# Patient Record
Sex: Female | Born: 1965
Health system: Southern US, Community
[De-identification: ages and names within clinical notes are randomized; demographics above are authoritative.]

## PROBLEM LIST (undated history)

## (undated) DIAGNOSIS — F419 Anxiety disorder, unspecified: Secondary | ICD-10-CM

## (undated) DIAGNOSIS — G4700 Insomnia, unspecified: Secondary | ICD-10-CM

## (undated) DIAGNOSIS — I6529 Occlusion and stenosis of unspecified carotid artery: Secondary | ICD-10-CM

## (undated) DIAGNOSIS — R51 Headache: Secondary | ICD-10-CM

## (undated) DIAGNOSIS — S15009A Unspecified injury of unspecified carotid artery, initial encounter: Secondary | ICD-10-CM

## (undated) DIAGNOSIS — K219 Gastro-esophageal reflux disease without esophagitis: Secondary | ICD-10-CM

## (undated) DIAGNOSIS — I10 Essential (primary) hypertension: Secondary | ICD-10-CM

## (undated) DIAGNOSIS — R002 Palpitations: Secondary | ICD-10-CM

## (undated) DIAGNOSIS — I671 Cerebral aneurysm, nonruptured: Secondary | ICD-10-CM

## (undated) DIAGNOSIS — I773 Arterial fibromuscular dysplasia: Secondary | ICD-10-CM

## (undated) DIAGNOSIS — I6509 Occlusion and stenosis of unspecified vertebral artery: Secondary | ICD-10-CM

## (undated) DIAGNOSIS — R519 Headache, unspecified: Secondary | ICD-10-CM

## (undated) DIAGNOSIS — K635 Polyp of colon: Secondary | ICD-10-CM

## (undated) DIAGNOSIS — M1990 Unspecified osteoarthritis, unspecified site: Secondary | ICD-10-CM

## (undated) DIAGNOSIS — Z8489 Family history of other specified conditions: Secondary | ICD-10-CM

## (undated) DIAGNOSIS — Z9889 Other specified postprocedural states: Secondary | ICD-10-CM

## (undated) HISTORY — DX: Gastro-esophageal reflux disease without esophagitis: K21.9

## (undated) HISTORY — DX: Other specified postprocedural states: Z98.890

## (undated) HISTORY — DX: Arterial fibromuscular dysplasia: I77.3

## (undated) HISTORY — DX: Occlusion and stenosis of unspecified vertebral artery: I65.09

## (undated) HISTORY — DX: Anxiety disorder, unspecified: F41.9

## (undated) HISTORY — PX: ESOPHAGOGASTRODUODENOSCOPY: SHX1529

## (undated) HISTORY — DX: Palpitations: R00.2

## (undated) HISTORY — DX: Occlusion and stenosis of unspecified carotid artery: I65.29

## (undated) HISTORY — DX: Essential (primary) hypertension: I10

## (undated) HISTORY — DX: Insomnia, unspecified: G47.00

## (undated) HISTORY — DX: Polyp of colon: K63.5

## (undated) HISTORY — DX: Unspecified osteoarthritis, unspecified site: M19.90

---

## 1989-10-19 HISTORY — PX: BREAST ENHANCEMENT SURGERY: SHX7

## 1999-03-20 ENCOUNTER — Other Ambulatory Visit: Admission: RE | Admit: 1999-03-20 | Discharge: 1999-03-20 | Payer: Self-pay | Admitting: Obstetrics and Gynecology

## 1999-05-12 ENCOUNTER — Other Ambulatory Visit: Admission: RE | Admit: 1999-05-12 | Discharge: 1999-05-12 | Payer: Self-pay | Admitting: Obstetrics and Gynecology

## 1999-05-12 ENCOUNTER — Encounter (INDEPENDENT_AMBULATORY_CARE_PROVIDER_SITE_OTHER): Payer: Self-pay

## 2000-12-16 ENCOUNTER — Encounter: Admission: RE | Admit: 2000-12-16 | Discharge: 2000-12-16 | Payer: Self-pay | Admitting: Endocrinology

## 2000-12-16 ENCOUNTER — Encounter: Payer: Self-pay | Admitting: Endocrinology

## 2001-01-10 ENCOUNTER — Other Ambulatory Visit: Admission: RE | Admit: 2001-01-10 | Discharge: 2001-01-10 | Payer: Self-pay | Admitting: Obstetrics and Gynecology

## 2002-02-01 ENCOUNTER — Other Ambulatory Visit: Admission: RE | Admit: 2002-02-01 | Discharge: 2002-02-01 | Payer: Self-pay | Admitting: Obstetrics and Gynecology

## 2003-02-07 ENCOUNTER — Other Ambulatory Visit: Admission: RE | Admit: 2003-02-07 | Discharge: 2003-02-07 | Payer: Self-pay | Admitting: Obstetrics and Gynecology

## 2003-05-02 ENCOUNTER — Encounter: Admission: RE | Admit: 2003-05-02 | Discharge: 2003-05-02 | Payer: Self-pay | Admitting: Obstetrics and Gynecology

## 2003-05-02 ENCOUNTER — Encounter: Payer: Self-pay | Admitting: Obstetrics and Gynecology

## 2004-02-08 ENCOUNTER — Other Ambulatory Visit: Admission: RE | Admit: 2004-02-08 | Discharge: 2004-02-08 | Payer: Self-pay | Admitting: Obstetrics and Gynecology

## 2004-08-15 ENCOUNTER — Encounter: Admission: RE | Admit: 2004-08-15 | Discharge: 2004-08-15 | Payer: Self-pay | Admitting: Cardiology

## 2004-08-25 ENCOUNTER — Ambulatory Visit: Payer: Self-pay | Admitting: Cardiology

## 2004-08-27 ENCOUNTER — Ambulatory Visit: Payer: Self-pay | Admitting: *Deleted

## 2004-08-27 ENCOUNTER — Ambulatory Visit: Payer: Self-pay

## 2004-09-09 ENCOUNTER — Ambulatory Visit: Payer: Self-pay | Admitting: Cardiology

## 2004-12-15 ENCOUNTER — Encounter: Admission: RE | Admit: 2004-12-15 | Discharge: 2004-12-15 | Payer: Self-pay | Admitting: Obstetrics and Gynecology

## 2005-03-12 ENCOUNTER — Other Ambulatory Visit: Admission: RE | Admit: 2005-03-12 | Discharge: 2005-03-12 | Payer: Self-pay | Admitting: Obstetrics and Gynecology

## 2005-10-19 DIAGNOSIS — Z9889 Other specified postprocedural states: Secondary | ICD-10-CM

## 2005-10-19 HISTORY — DX: Other specified postprocedural states: Z98.890

## 2006-02-26 ENCOUNTER — Encounter: Admission: RE | Admit: 2006-02-26 | Discharge: 2006-02-26 | Payer: Self-pay | Admitting: Obstetrics & Gynecology

## 2006-05-20 ENCOUNTER — Encounter: Admission: RE | Admit: 2006-05-20 | Discharge: 2006-05-20 | Payer: Self-pay | Admitting: Family Medicine

## 2007-03-23 ENCOUNTER — Encounter: Admission: RE | Admit: 2007-03-23 | Discharge: 2007-03-23 | Payer: Self-pay | Admitting: Obstetrics

## 2007-04-12 ENCOUNTER — Ambulatory Visit (HOSPITAL_COMMUNITY): Admission: RE | Admit: 2007-04-12 | Discharge: 2007-04-12 | Payer: Self-pay | Admitting: Obstetrics

## 2007-12-27 ENCOUNTER — Encounter: Admission: RE | Admit: 2007-12-27 | Discharge: 2007-12-27 | Payer: Self-pay | Admitting: Family Medicine

## 2008-01-02 ENCOUNTER — Ambulatory Visit: Payer: Self-pay | Admitting: Cardiology

## 2008-04-17 ENCOUNTER — Ambulatory Visit: Payer: Self-pay | Admitting: Family Medicine

## 2008-04-17 DIAGNOSIS — R51 Headache: Secondary | ICD-10-CM | POA: Insufficient documentation

## 2008-04-17 DIAGNOSIS — I10 Essential (primary) hypertension: Secondary | ICD-10-CM | POA: Insufficient documentation

## 2008-04-17 DIAGNOSIS — R519 Headache, unspecified: Secondary | ICD-10-CM | POA: Insufficient documentation

## 2008-04-17 DIAGNOSIS — F411 Generalized anxiety disorder: Secondary | ICD-10-CM

## 2008-04-17 DIAGNOSIS — G4700 Insomnia, unspecified: Secondary | ICD-10-CM | POA: Insufficient documentation

## 2008-04-17 DIAGNOSIS — K219 Gastro-esophageal reflux disease without esophagitis: Secondary | ICD-10-CM | POA: Insufficient documentation

## 2008-04-27 ENCOUNTER — Encounter: Admission: RE | Admit: 2008-04-27 | Discharge: 2008-04-27 | Payer: Self-pay | Admitting: Obstetrics

## 2008-06-22 ENCOUNTER — Ambulatory Visit: Payer: Self-pay | Admitting: Family Medicine

## 2008-07-11 ENCOUNTER — Telehealth: Payer: Self-pay | Admitting: Family Medicine

## 2008-09-27 ENCOUNTER — Encounter: Payer: Self-pay | Admitting: Family Medicine

## 2008-10-01 ENCOUNTER — Ambulatory Visit: Payer: Self-pay | Admitting: Cardiology

## 2008-10-10 ENCOUNTER — Ambulatory Visit: Payer: Self-pay | Admitting: Family Medicine

## 2008-10-29 ENCOUNTER — Ambulatory Visit: Payer: Self-pay | Admitting: Cardiology

## 2008-12-13 ENCOUNTER — Telehealth: Payer: Self-pay | Admitting: Family Medicine

## 2009-02-22 ENCOUNTER — Telehealth: Payer: Self-pay | Admitting: Family Medicine

## 2009-03-26 ENCOUNTER — Telehealth: Payer: Self-pay | Admitting: Cardiology

## 2009-03-27 DIAGNOSIS — R002 Palpitations: Secondary | ICD-10-CM | POA: Insufficient documentation

## 2009-04-04 ENCOUNTER — Telehealth: Payer: Self-pay | Admitting: Family Medicine

## 2009-04-30 ENCOUNTER — Encounter: Admission: RE | Admit: 2009-04-30 | Discharge: 2009-04-30 | Payer: Self-pay | Admitting: Obstetrics

## 2009-05-08 ENCOUNTER — Ambulatory Visit: Payer: Self-pay | Admitting: Family Medicine

## 2009-05-09 ENCOUNTER — Telehealth: Payer: Self-pay | Admitting: Family Medicine

## 2009-06-07 ENCOUNTER — Telehealth: Payer: Self-pay | Admitting: Family Medicine

## 2009-08-05 ENCOUNTER — Telehealth: Payer: Self-pay | Admitting: Cardiology

## 2009-08-05 ENCOUNTER — Emergency Department (HOSPITAL_COMMUNITY): Admission: EM | Admit: 2009-08-05 | Discharge: 2009-08-06 | Payer: Self-pay | Admitting: Emergency Medicine

## 2009-08-07 ENCOUNTER — Ambulatory Visit: Payer: Self-pay | Admitting: Family Medicine

## 2009-08-09 ENCOUNTER — Encounter: Payer: Self-pay | Admitting: Family Medicine

## 2009-10-15 ENCOUNTER — Telehealth: Payer: Self-pay | Admitting: Family Medicine

## 2009-10-31 ENCOUNTER — Telehealth: Payer: Self-pay | Admitting: Family Medicine

## 2009-11-20 ENCOUNTER — Ambulatory Visit: Payer: Self-pay | Admitting: Cardiology

## 2009-11-24 ENCOUNTER — Emergency Department (HOSPITAL_COMMUNITY): Admission: EM | Admit: 2009-11-24 | Discharge: 2009-11-24 | Payer: Self-pay | Admitting: Emergency Medicine

## 2009-12-27 ENCOUNTER — Ambulatory Visit: Payer: Self-pay | Admitting: Family Medicine

## 2009-12-27 DIAGNOSIS — M129 Arthropathy, unspecified: Secondary | ICD-10-CM | POA: Insufficient documentation

## 2009-12-27 DIAGNOSIS — G5760 Lesion of plantar nerve, unspecified lower limb: Secondary | ICD-10-CM | POA: Insufficient documentation

## 2009-12-30 LAB — CONVERTED CEMR LAB
ALT: 14 units/L (ref 0–35)
Albumin: 4 g/dL (ref 3.5–5.2)
Alkaline Phosphatase: 70 units/L (ref 39–117)
Calcium: 9.5 mg/dL (ref 8.4–10.5)
Eosinophils Absolute: 0 10*3/uL (ref 0.0–0.7)
GFR calc non Af Amer: 114.53 mL/min (ref >60)
HCT: 41.9 % (ref 36.0–46.0)
Hemoglobin: 13.7 g/dL (ref 12.0–15.0)
Lymphs Abs: 1.8 10*3/uL (ref 0.7–4.0)
MCHC: 32.7 g/dL (ref 30.0–36.0)
Neutrophils Relative %: 67.5 % (ref 43.0–77.0)
Platelets: 271 10*3/uL (ref 150.0–400.0)
RDW: 12.1 % (ref 11.5–14.6)
Rhuematoid fact SerPl-aCnc: 20.9 intl units/mL — ABNORMAL HIGH (ref 0.0–20.0)
Sed Rate: 18 mm/hr (ref 0–22)
TSH: 0.71 microintl units/mL (ref 0.35–5.50)
Total Protein: 7.5 g/dL (ref 6.0–8.3)
WBC: 7.8 10*3/uL (ref 4.5–10.5)

## 2010-01-01 LAB — CONVERTED CEMR LAB
ANA Titer 1: NEGATIVE
Anti Nuclear Antibody(ANA): POSITIVE — AB

## 2010-03-14 ENCOUNTER — Telehealth: Payer: Self-pay | Admitting: Family Medicine

## 2010-05-02 ENCOUNTER — Encounter: Admission: RE | Admit: 2010-05-02 | Discharge: 2010-05-02 | Payer: Self-pay | Admitting: Obstetrics

## 2010-05-16 ENCOUNTER — Telehealth (INDEPENDENT_AMBULATORY_CARE_PROVIDER_SITE_OTHER): Payer: Self-pay | Admitting: *Deleted

## 2010-05-16 ENCOUNTER — Ambulatory Visit: Payer: Self-pay | Admitting: Cardiology

## 2010-05-16 ENCOUNTER — Encounter (INDEPENDENT_AMBULATORY_CARE_PROVIDER_SITE_OTHER): Payer: Self-pay | Admitting: *Deleted

## 2010-05-16 ENCOUNTER — Encounter: Payer: Self-pay | Admitting: Cardiology

## 2010-05-16 ENCOUNTER — Observation Stay (HOSPITAL_COMMUNITY): Admission: EM | Admit: 2010-05-16 | Discharge: 2010-05-17 | Payer: Self-pay | Admitting: Emergency Medicine

## 2010-05-20 ENCOUNTER — Ambulatory Visit: Payer: Self-pay | Admitting: Family Medicine

## 2010-05-20 DIAGNOSIS — R079 Chest pain, unspecified: Secondary | ICD-10-CM | POA: Insufficient documentation

## 2010-05-21 ENCOUNTER — Telehealth: Payer: Self-pay | Admitting: Cardiology

## 2010-05-26 ENCOUNTER — Encounter: Payer: Self-pay | Admitting: Cardiology

## 2010-06-03 ENCOUNTER — Ambulatory Visit: Payer: Self-pay | Admitting: Cardiovascular Disease

## 2010-06-03 ENCOUNTER — Ambulatory Visit: Payer: Self-pay

## 2010-06-03 ENCOUNTER — Encounter: Payer: Self-pay | Admitting: Cardiology

## 2010-06-03 ENCOUNTER — Ambulatory Visit (HOSPITAL_COMMUNITY): Admission: RE | Admit: 2010-06-03 | Discharge: 2010-06-03 | Payer: Self-pay | Admitting: Obstetrics and Gynecology

## 2010-06-04 ENCOUNTER — Telehealth: Payer: Self-pay | Admitting: Cardiology

## 2010-06-09 ENCOUNTER — Ambulatory Visit (HOSPITAL_COMMUNITY): Admission: RE | Admit: 2010-06-09 | Discharge: 2010-06-09 | Payer: Self-pay | Admitting: Orthopaedic Surgery

## 2010-06-24 ENCOUNTER — Encounter: Payer: Self-pay | Admitting: Physician Assistant

## 2010-06-27 ENCOUNTER — Ambulatory Visit: Payer: Self-pay | Admitting: Cardiology

## 2010-06-27 ENCOUNTER — Encounter: Payer: Self-pay | Admitting: Physician Assistant

## 2010-10-03 ENCOUNTER — Ambulatory Visit (HOSPITAL_COMMUNITY)
Admission: RE | Admit: 2010-10-03 | Discharge: 2010-10-03 | Payer: Self-pay | Source: Home / Self Care | Attending: Obstetrics | Admitting: Obstetrics

## 2010-11-09 ENCOUNTER — Encounter: Payer: Self-pay | Admitting: Nephrology

## 2010-11-18 NOTE — Assessment & Plan Note (Signed)
Summary: eph per night message/post stress echo/lg   Visit Type:  Follow-up Primary Provider:  Nelwyn Salisbury MD   History of Present Illness: this is a 45 year old female patient who was recently admitted to the hospital with atypical chest pain. She ruled out for an MI and was scheduled for an outpatient stress echo. Stress echo was performed and showed normal LV function without wall motion abnormalities ejection fraction 55%. Stress portion was normal.  The patient also had trouble with hypertension in the hospital.she had been on metoprolol over the years but this was no longer controlling her blood pressure. She was switched to labetalol and this seems to be helping more. She works at [employer] and they have been checking her blood pressure daily. Systolic blood pressures been around 118 and diastolic in the 80s to 90s.  Patient denies any further chest pain, palpitations, dyspnea, dyspnea on exertion, dizziness, or presyncope.  Current Medications (verified): 1)  Maxalt-Mlt 10 Mg  Tbdp (Rizatriptan Benzoate) .... As Needed 2)  Lorazepam 1 Mg  Tabs (Lorazepam) .Marland Kitchen.. 1 Every 6 Hours As Needed Anxiety 3)  Nexium 40 Mg Cpdr (Esomeprazole Magnesium) .... Once Daily 4)  Zolpidem Tartrate 10 Mg  Tabs (Zolpidem Tartrate) .Marland Kitchen.. 1 By Mouth At Bedtime 5)  Vicodin 5-500 Mg Tabs (Hydrocodone-Acetaminophen) .Marland Kitchen.. 1 Q 6 Hours As Needed Pain 6)  Labetalol Hcl 200 Mg Tabs (Labetalol Hcl) .... 1/2 Tab Two Times A Day 7)  Promethazine Hcl 25 Mg Tabs (Promethazine Hcl) .Marland Kitchen.. 1 Q 4 Hours As Needed Nausea 8)  Aspirin 81 Mg Tbec (Aspirin) .... Once Daily  Allergies: 1)  ! Amoxicillin 2)  ! Sulfa  Past History:  Past Medical History: Last updated: 12/27/2009 Current Problems:  PALPITATIONS, HX OF (ICD-V12.50) HYPERTENSION (ICD-401.9) GERD (ICD-530.81) INSOMNIA (ICD-780.52) ANXIETY (ICD-300.00) HEADACHE (ICD-784.0)  Social History: Reviewed history from 03/27/2009 and no changes required.  The patient is divorced.  She has two children and now   two grandchildren.  She works at [employer].  She does walk   occasionally for exercise but has not done this recently.  She does not   smoke cigarettes.  She rarely drinks alcohol.      Review of Systems       see history of present illness  Vital Signs:  Patient profile:   45 year old female Height:      64.5 inches Weight:      150 pounds BMI:     25.44 Pulse rate:   77 / minute BP sitting:   122 / 82  (left arm)  Vitals Entered By: Laurance Flatten CMA (June 27, 2010 8:38 AM)  Physical Exam  General:   Well-nournished, in no acute distress. Neck: No JVD, HJR, Bruit, or thyroid enlargement Lungs: No tachypnea, clear without wheezing, rales, or rhonchi Cardiovascular: RRR, PMI not displaced, heart sounds normal, no murmurs, gallops, bruit, thrill, or heave. Abdomen: BS normal. Soft without organomegaly, masses, lesions or tenderness. Extremities: without cyanosis, clubbing or edema. Good distal pulses bilateral SKin: Warm, no lesions or rashes  Musculoskeletal: No deformities Neuro: no focal signs    EKG  Procedure date:  06/27/2010  Findings:      normal sinus rhythm ,normal EKG  Impression & Recommendations:  Problem # 1:  CHEST PAIN (ICD-786.50) Patient was admitted to the hospital with atypical chest pain. Stress echo was normal June 03, 2010 The following medications were removed from the medication list:    Metoprolol Tartrate 50 Mg  Tabs (Metoprolol tartrate) .Marland Kitchen... Take 1 tablet by mouth twice a day Her updated medication list for this problem includes:    Labetalol Hcl 200 Mg Tabs (Labetalol hcl) .Marland Kitchen... 1/2 tab two times a day    Aspirin 81 Mg Tbec (Aspirin) ..... Once daily  Problem # 2:  HYPERTENSION (ICD-401.9) Patient's blood pressure is better controlled on labetalol. I did discuss a low-sodium diet and we will give her 2 g sodium diet to follow up home. She says she eats alot of soups  and canned foods.She can follow up with Dr. Clent Ridges for her hypertension. The following medications were removed from the medication list:    Metoprolol Tartrate 50 Mg Tabs (Metoprolol tartrate) .Marland Kitchen... Take 1 tablet by mouth twice a day Her updated medication list for this problem includes:    Labetalol Hcl 200 Mg Tabs (Labetalol hcl) .Marland Kitchen... 1/2 tab two times a day    Aspirin 81 Mg Tbec (Aspirin) ..... Once daily  Problem # 3:  PALPITATIONS, HX OF (ICD-V12.50) Palpitations are resolved on labetalol  Patient Instructions: 1)  Your physician has requested that you limit the intake of sodium (salt) in your diet to two grams daily. Please see [facility] handout. 2)  Follow up as needed  Prevention & Chronic Care Immunizations   Influenza vaccine: Not documented    Tetanus booster: Not documented    Pneumococcal vaccine: Not documented  Other Screening   Pap smear: Not documented    Mammogram: Not documented   Smoking status: Not documented  Lipids   Total Cholesterol: Not documented   LDL: Not documented   LDL Direct: Not documented   HDL: Not documented   Triglycerides: Not documented  Hypertension   Last Blood Pressure: 122 / 82  (06/27/2010)   Serum creatinine: 0.6  (12/27/2009)   Serum potassium 4.3  (12/27/2009)  Self-Management Support :    Hypertension self-management support: Not documented

## 2010-11-18 NOTE — Miscellaneous (Signed)
  Clinical Lists Changes  Observations: Added new observation of MRA RENAL:  Findings:  There is flow in the celiac trunk, proximal SMA and   proximal IMA.  There are bilateral single renal arteries.  The   renal arteries are widely patent without evidence of stenosis.  The   visualized aorta is normal.    Incidentally, the patient has bilateral breast implants.  No gross   abnormality to the liver, adrenal glands, gallbladder, spleen or   pancreas.     IMPRESSION:   Normal MRA of the abdomen.  Negative for renal artery stenosis.  (06/09/2010 10:34) Added new observation of ECHOINTERP: - Stress ECG conclusions: There were no stress arrhythmias or   conduction abnormalities. The stress ECG was negative for   ischemia. - Staged echo: There was no echocardiographic evidence for   stress-induced ischemia. (06/03/2010 10:33)      Echocardiogram  Procedure date:  06/03/2010  Findings:      - Stress ECG conclusions: There were no stress arrhythmias or   conduction abnormalities. The stress ECG was negative for   ischemia. - Staged echo: There was no echocardiographic evidence for   stress-induced ischemia.  MRA Renal  Procedure date:  06/09/2010  Findings:       Findings:  There is flow in the celiac trunk, proximal SMA and   proximal IMA.  There are bilateral single renal arteries.  The   renal arteries are widely patent without evidence of stenosis.  The   visualized aorta is normal.    Incidentally, the patient has bilateral breast implants.  No gross   abnormality to the liver, adrenal glands, gallbladder, spleen or   pancreas.     IMPRESSION:   Normal MRA of the abdomen.  Negative for renal artery stenosis.

## 2010-11-18 NOTE — Progress Notes (Signed)
Summary: echo results  Phone Note Call from Patient   Caller: Patient [phone] Reason for Call: Talk to Nurse Summary of Call: wants echo results from yesterday-pls call [phone] Initial call taken by: Glynda Jaeger,  June 04, 2010 3:01 PM  Follow-up for Phone Call        I spoke with the pt and gave her the results of her stress echo.  The pt has a pending appt with the PA on 06/17/10.  I instructed the pt to keep this appt as scheduled.  I did make her aware that [facility] had not reviewed this test and our office would call her back with any other recommendations from Dr Antoine Poche.   Follow-up by: Julieta Gutting, RN, BSN,  June 04, 2010 3:16 PM

## 2010-11-18 NOTE — Progress Notes (Signed)
Summary: Pt req refill for Vicodin  Phone Note Call from Patient Call back at Home Phone [phone]   Caller: Patient Summary of Call: Pt called and is asking for a refill of Vicodin. Pls call in to [facility] on [address]  According to [facility]  last refills were on  3/11 3/24 4/3  4/14 4/29 5/12. Initial call taken by: Lucy Antigua,  Mar 14, 2010 11:44 AM  Follow-up for Phone Call        NO more refills. She is using too much of this. needs an OV to discuss this Follow-up by: Nelwyn Salisbury MD,  Mar 14, 2010 2:36 PM  Additional Follow-up for Phone Call Additional follow up Details #1::        LMOM needs OV for refills Additional Follow-up by: Raechel Ache, RN,  Mar 14, 2010 2:48 PM

## 2010-11-18 NOTE — Progress Notes (Signed)
Summary: Pt req to switch back to generic Ambien. The CR is too expensive  Phone Note Call from Patient Call back at Home Phone [phone]   Caller: Patient Summary of Call: Pt called and said that the Ambien CR is too expensive and would like to switch back to generic Ambien. Please call in generic Ambien to [facility] on [address]  Initial call taken by: Lucy Antigua,  October 31, 2009 10:10 AM  Follow-up for Phone Call        cancel the CR, and call in Zolpidem 10 mg at bedtime , #30 with 5 rf Follow-up by: Nelwyn Salisbury MD,  October 31, 2009 11:42 AM  Additional Follow-up for Phone Call Additional follow up Details #1::        Phone Call Completed, Rx Called In Additional Follow-up by: Alfred Levins, CMA,  October 31, 2009 11:44 AM    New/Updated Medications: ZOLPIDEM TARTRATE 10 MG  TABS (ZOLPIDEM TARTRATE) 1 by mouth at bedtime Prescriptions: ZOLPIDEM TARTRATE 10 MG  TABS (ZOLPIDEM TARTRATE) 1 by mouth at bedtime  #30 x 5   Entered by:   Alfred Levins, CMA   Authorized by:   Nelwyn Salisbury MD   Signed by:   Alfred Levins, CMA on 10/31/2009   Method used:   Telephoned to ...       [facility]  [address] [phone]* (retail)       [address] [place], [place]  [address]       Ph: [phone] or [phone]       Fax: [phone]   RxID:   [id number]

## 2010-11-18 NOTE — Letter (Signed)
Summary: ER Notification  [facility]ologist, Main Office  [address]   [place], [place] [address]   Phone: [phone]  Fax: [phone]    May 16, 2010 11:02 AM  Caroline Boone  The above referenced patient has been advised to report directly to the Emergency Room. Please see below for more information:  Dx: HYPERTENSIVE CRISIS     Private Vehicle  _____XX__________ or EMS:  ________________   Orders:  Yes ______ or No  _______   Notify upon arrival:     Trish [phone]       Or _________________   Thank you,    [facility] Staff

## 2010-11-18 NOTE — Assessment & Plan Note (Signed)
Summary: consult re: meds and ?gout/cjr   Vital Signs:  Patient profile:   45 year old female Weight:      151 pounds Temp:     98.5 degrees F oral Pulse rate:   92 / minute BP sitting:   122 / 98  (left arm) Cuff size:   regular  Vitals Entered By: Alfred Levins, CMA (December 27, 2009 8:39 AM) CC: migraine, gout lt foot   History of Present Illness: here for several issues. First for the past 2 months she has had frequent pain in the left forefoot. No trauma. It gets red at times but does not swell. She saw Urgent Care a week ago, and had a normal Xray and a test for uric acid that was normal. She was put on Meloxicam with slight improvement. Second, her maxalt often doesn't relieve her HAs, and she would like to have a rescue pain med to use. Third, she has some joint pains that come and go, and she has some rheumatoid arthritis in her family. She would like to be tested for this.   Current Medications (verified): 1)  Maxalt-Mlt 10 Mg  Tbdp (Rizatriptan Benzoate) .... As Needed 2)  Lorazepam 1 Mg  Tabs (Lorazepam) .Marland Kitchen.. 1 Every 6 Hours As Needed Anxiety 3)  Promethazine Hcl 25 Mg Supp (Promethazine Hcl) .Marland Kitchen.. 1 Every 4 Hours As Needed For Nausea 4)  Metoprolol Tartrate 50 Mg Tabs (Metoprolol Tartrate) .... Take 1 Tablet By Mouth Twice A Day 5)  Nexium 40 Mg Cpdr (Esomeprazole Magnesium) .... Take 1 Capsule By Mouth Once A Day 6)  Hydrochlorothiazide 25 Mg Tabs (Hydrochlorothiazide) .... Once Daily 7)  Zolpidem Tartrate 10 Mg  Tabs (Zolpidem Tartrate) .Marland Kitchen.. 1 By Mouth At Bedtime  Allergies (verified): 1)  ! Amoxicillin 2)  ! Sulfa  Past History:  Past Medical History: Current Problems:  PALPITATIONS, HX OF (ICD-V12.50) HYPERTENSION (ICD-401.9) GERD (ICD-530.81) INSOMNIA (ICD-780.52) ANXIETY (ICD-300.00) HEADACHE (ICD-784.0)  Past Surgical History: Reviewed history from 11/20/2009 and no changes required. Heart Catherization 2007 was normal Breast augmentation.   Review of  Systems  The patient denies anorexia, fever, weight loss, weight gain, vision loss, decreased hearing, hoarseness, chest pain, syncope, dyspnea on exertion, peripheral edema, prolonged cough, hemoptysis, abdominal pain, melena, hematochezia, severe indigestion/heartburn, hematuria, incontinence, genital sores, muscle weakness, suspicious skin lesions, transient blindness, difficulty walking, depression, unusual weight change, abnormal bleeding, enlarged lymph nodes, angioedema, breast masses, and testicular masses.    Physical Exam  General:  Well-developed,well-nourished,in no acute distress; alert,appropriate and cooperative throughout examination Msk:  No deformity or scoliosis noted of thoracic or lumbar spine.   Extremities:  No clubbing, cyanosis, edema, or deformity noted with normal full range of motion of all joints.  Tender in the left foot between the 1st and 2nd MTPs  Neurologic:  No cranial nerve deficits noted. Station and gait are normal. Plantar reflexes are down-going bilaterally. DTRs are symmetrical throughout. Sensory, motor and coordinative functions appear intact.   Impression & Recommendations:  Problem # 1:  MORTON'S NEUROMA (ICD-355.6)  Problem # 2:  ARTHRITIS (ICD-716.90)  Orders: Venipuncture (19147) TLB-BMP (Basic Metabolic Panel-BMET) (80048-METABOL) TLB-CBC Platelet - w/Differential (85025-CBCD) TLB-Hepatic/Liver Function Pnl (80076-HEPATIC) TLB-TSH (Thyroid Stimulating Hormone) (84443-TSH) TLB-Rheumatoid Factor (RA) (82956-OZ) TLB-Sedimentation Rate (ESR) (85652-ESR) T-Antinuclear Antib (ANA) (30865-78469)  Problem # 3:  HEADACHE (ICD-784.0)  Her updated medication list for this problem includes:    Maxalt-mlt 10 Mg Tbdp (Rizatriptan benzoate) .Marland Kitchen... As needed    Metoprolol Tartrate 50 Mg Tabs (  Metoprolol tartrate) .Marland Kitchen... Take 1 tablet by mouth twice a day    Vicodin 5-500 Mg Tabs (Hydrocodone-acetaminophen) .Marland Kitchen... 1 q 6 hours as needed pain  Complete  Medication List: 1)  Maxalt-mlt 10 Mg Tbdp (Rizatriptan benzoate) .... As needed 2)  Lorazepam 1 Mg Tabs (Lorazepam) .Marland Kitchen.. 1 every 6 hours as needed anxiety 3)  Promethazine Hcl 25 Mg Supp (Promethazine hcl) .Marland Kitchen.. 1 every 4 hours as needed for nausea 4)  Metoprolol Tartrate 50 Mg Tabs (Metoprolol tartrate) .... Take 1 tablet by mouth twice a day 5)  Nexium 40 Mg Cpdr (Esomeprazole magnesium) .... Take 1 capsule by mouth once a day 6)  Hydrochlorothiazide 25 Mg Tabs (Hydrochlorothiazide) .... Once daily 7)  Zolpidem Tartrate 10 Mg Tabs (Zolpidem tartrate) .Marland Kitchen.. 1 by mouth at bedtime 8)  Vicodin 5-500 Mg Tabs (Hydrocodone-acetaminophen) .Marland Kitchen.. 1 q 6 hours as needed pain  Patient Instructions: 1)  get labs. Add Vicodin to Maxalt as needed . Suggested she see the [facility] for her neuroma Prescriptions: VICODIN 5-500 MG TABS (HYDROCODONE-ACETAMINOPHEN) 1 q 6 hours as needed pain  #30 x 5   Entered and Authorized by:   Nelwyn Salisbury MD   Signed by:   Nelwyn Salisbury MD on 12/27/2009   Method used:   Print then Give to Patient   RxID:   [id number] MAXALT-MLT 10 MG  TBDP (RIZATRIPTAN BENZOATE) as needed  #12 x 11   Entered and Authorized by:   Nelwyn Salisbury MD   Signed by:   Nelwyn Salisbury MD on 12/27/2009   Method used:   Print then Give to Patient   RxID:   [id number]

## 2010-11-18 NOTE — Progress Notes (Signed)
Summary: lab work   Phone Note Call from Patient Call back at [employer] [phone]   Caller: Patient  Reason for Call: Talk to Nurse, Lab or Test Results Details for Reason: pt would like to have lab work draw at her office. fax # [phone]  Initial call taken by: Lorne Skeens,  May 21, 2010 2:30 PM  Follow-up for Phone Call        Spoke with pt. patient states she was d/c from hospital 05/17/10 On d/c she was adviced to come to the office for lab work on monday 05/26/10.She would like to have labs done at her work at [employer]. An order was faxed to [facility] for BMET lab work  per pt's request. Labs results to be fax back to office. Pt aware. Follow-up by: Ollen Gross, RN, BSN,  May 21, 2010 3:22 PM

## 2010-11-18 NOTE — Progress Notes (Signed)
  Phone Note From Other Clinic   Summary of Call: recieved DOD call that pt had a severe headache, palpitations and "looked bad". her bp is reported as 180/114. dr Jens Som discussed with caller and pt referred to [facility] for eval. cardmaster and ed notified. Initial call taken by: Deliah Goody, RN,  May 16, 2010 11:05 AM Caller: OFFICE

## 2010-11-18 NOTE — Assessment & Plan Note (Signed)
Summary: ROV/ GD   Visit Type:  Follow-up Primary Provider:  Nelwyn Salisbury MD  CC:  HTN.  History of Present Illness: The patient presents for followup of hypertension. Since I last saw her she has had a couple of episodes of elevated blood pressure. At one point she was in the emergency room with some chest discomfort and apparently her blood pressure was elevated. I reviewed these records but, as it is with ER notes, I had trouble finding data including the blood pressure. I do note that she was treated with a Z-Pak. A few days later she saw Dr. Clent Ridges and had hydrochlorothiazide added to her regimen. She says that at times she'll run with blood pressures in the 140-160 systolic range with diastolics above 100. At other times, as it is now, her blood pressure is very well controlled. She has no new cardiovascular symptoms. She is exercising at the gym. She denies chest discomfort, neck or arm discomfort. She denies shortness of breath, palpitations, presyncope or syncope.  Current Medications (verified): 1)  Maxalt-Mlt 10 Mg  Tbdp (Rizatriptan Benzoate) .... As Needed 2)  Lorazepam 1 Mg  Tabs (Lorazepam) .Marland Kitchen.. 1 Every 6 Hours As Needed Anxiety 3)  Promethazine Hcl 25 Mg Supp (Promethazine Hcl) .Marland Kitchen.. 1 Every 4 Hours As Needed For Nausea 4)  Metoprolol Tartrate 50 Mg Tabs (Metoprolol Tartrate) .... Take 1 Tablet By Mouth Twice A Day 5)  Nexium 40 Mg Cpdr (Esomeprazole Magnesium) .... Take 1 Capsule By Mouth Once A Day 6)  Hydrochlorothiazide 25 Mg Tabs (Hydrochlorothiazide) .... Once Daily 7)  Zolpidem Tartrate 10 Mg  Tabs (Zolpidem Tartrate) .Marland Kitchen.. 1 By Mouth At Bedtime  Allergies (verified): 1)  ! Amoxicillin 2)  ! Sulfa  Past History:  Past Medical History: Reviewed history from 03/27/2009 and no changes required. Current Problems:  PALPITATIONS, HX OF (ICD-V12.50) HYPERTENSION (ICD-401.9) GERD (ICD-530.81) GASTROENTERITIS (ICD-558.9) INSOMNIA (ICD-780.52) ACUTE SINUSITIS, UNSPECIFIED  (ICD-461.9) ANXIETY (ICD-300.00) FAMILY HISTORY DIABETES 1ST DEGREE RELATIVE (ICD-V18.0) FAMILY HISTORY BREAST CANCER 1ST DEGREE RELATIVE <50 (ICD-V16.3) HEADACHE (ICD-784.0)  Past Surgical History: Heart Catherization 2007 was normal Breast augmentation.   Review of Systems       As stated in the HPI and negative for all other systems.   Vital Signs:  Patient profile:   45 year old female Height:      64.5 inches Weight:      149 pounds BMI:     25.27 Pulse rate:   82 / minute Resp:     16 per minute BP sitting:   110 / 80  (right arm)  Vitals Entered By: Marrion Coy, CNA (November 20, 2009 10:17 AM)  Physical Exam  General:  Well developed, well nourished, in no acute distress. Head:  normocephalic and atraumatic Eyes:  PERRLA/EOM intact; conjunctiva and lids normal. Mouth:  Teeth, gums and palate normal. Oral mucosa normal. Neck:  Neck supple, no JVD. No masses, thyromegaly or abnormal cervical nodes. Chest Wall:  no deformities or breast masses noted Lungs:  Clear bilaterally to auscultation and percussion. Abdomen:  Bowel sounds positive; abdomen soft and non-tender without masses, organomegaly, or hernias noted. No hepatosplenomegaly. Msk:  Back normal, normal gait. Muscle strength and tone normal. Extremities:  No clubbing or cyanosis. Neurologic:  Alert and oriented x 3. Skin:  Intact without lesions or rashes. Cervical Nodes:  no significant adenopathy Axillary Nodes:  no significant adenopathy Inguinal Nodes:  no significant adenopathy Psych:  Normal affect.   Detailed Cardiovascular Exam  Neck    Carotids: Carotids full and equal bilaterally without bruits.      Neck Veins: Normal, no JVD.    Heart    Inspection: no deformities or lifts noted.      Palpation: normal PMI with no thrills palpable.      Auscultation: regular rate and rhythm, S1, S2 without murmurs, rubs, gallops, or clicks.    Vascular    Abdominal Aorta: no palpable masses,  pulsations, or audible bruits.      Femoral Pulses: normal femoral pulses bilaterally.      Pedal Pulses: normal pedal pulses bilaterally.      Radial Pulses: normal radial pulses bilaterally.      Peripheral Circulation: no clubbing, cyanosis, or edema noted with normal capillary refill.     EKG  Procedure date:  11/20/2009  Findings:      sinus rhythm, rate 80 to, axis within normal limits, nonspecific inferior and anterolateral T wave flattening, no significant change from previous  Impression & Recommendations:  Problem # 1:  HYPERTENSION (ICD-401.9) At this point we discussed treating her hypertension with an additional dose of beta blocker or half dose of beta blocker on the days when her blood pressure is elevated. She seems to have several days and around which would allow her to continue the higher dose and then wean herself down. She will let me know if she starts requiring this frequently as we might need to change meds. Otherwise no change in therapy is indicated.  Problem # 2:  PALPITATIONS, HX OF (ICD-V12.50) She he is having only rare symptoms. No further testing is suggested. Orders: EKG w/ Interpretation (93000)  Patient Instructions: 1)  Your physician recommends that you schedule a follow-up appointment in: 1 year with Dr Antoine Poche 2)  Your physician recommends that you continue on your current medications as directed. Please refer to the Current Medication list given to you today.

## 2010-11-18 NOTE — Assessment & Plan Note (Signed)
Summary: post hos fup/?sinus inf/njr   Vital Signs:  Patient profile:   45 year old female Weight:      152 pounds Temp:     98.5 degrees F oral BP sitting:   150 / 90  (left arm) Cuff size:   regular  Vitals Entered By: Kathrynn Speed CMA (May 20, 2010 10:10 AM) CC: post hospital fu, sinus infection, congestion, x 3 days,src   History of Present Illness: Here for follow up after a hospital stay from 05-16-10 to 05-17-10 for HAs and chest pains. She ruled out by enzymes, had a normal contrasted head CT and a normal ECHO. Lisinopril was added to her meds for HTN. She feels better now, but still gets a mild HA at times. She is set to have a stress test on 06-03-10, and she will see Dr. Antoine Poche after that.   Current Medications (verified): 1)  Maxalt-Mlt 10 Mg  Tbdp (Rizatriptan Benzoate) .... As Needed 2)  Lorazepam 1 Mg  Tabs (Lorazepam) .Marland Kitchen.. 1 Every 6 Hours As Needed Anxiety 3)  Promethazine Hcl 25 Mg Supp (Promethazine Hcl) .Marland Kitchen.. 1 Every 4 Hours As Needed For Nausea 4)  Metoprolol Tartrate 50 Mg Tabs (Metoprolol Tartrate) .... Take 1 Tablet By Mouth Twice A Day 5)  Nexium 40 Mg Cpdr (Esomeprazole Magnesium) .... Take 1 Capsule By Mouth Once A Day 6)  Hydrochlorothiazide 25 Mg Tabs (Hydrochlorothiazide) .... Once Daily 7)  Zolpidem Tartrate 10 Mg  Tabs (Zolpidem Tartrate) .Marland Kitchen.. 1 By Mouth At Bedtime 8)  Vicodin 5-500 Mg Tabs (Hydrocodone-Acetaminophen) .Marland Kitchen.. 1 Q 6 Hours As Needed Pain 9)  Lisinopril 10 Mg Tabs (Lisinopril) .... Once A Day  Allergies (verified): 1)  ! Amoxicillin 2)  ! Sulfa  Past History:  Past Medical History: Reviewed history from 12/27/2009 and no changes required. Current Problems:  PALPITATIONS, HX OF (ICD-V12.50) HYPERTENSION (ICD-401.9) GERD (ICD-530.81) INSOMNIA (ICD-780.52) ANXIETY (ICD-300.00) HEADACHE (ICD-784.0)  Past Surgical History: Reviewed history from 11/20/2009 and no changes required. Heart Catherization 2007 was normal Breast  augmentation.   Review of Systems  The patient denies anorexia, fever, weight loss, weight gain, vision loss, decreased hearing, hoarseness, syncope, dyspnea on exertion, peripheral edema, prolonged cough, hemoptysis, abdominal pain, melena, hematochezia, severe indigestion/heartburn, hematuria, incontinence, genital sores, muscle weakness, suspicious skin lesions, transient blindness, difficulty walking, depression, unusual weight change, abnormal bleeding, enlarged lymph nodes, angioedema, breast masses, and testicular masses.    Physical Exam  General:  Well-developed,well-nourished,in no acute distress; alert,appropriate and cooperative throughout examination Neck:  No deformities, masses, or tenderness noted. Chest Wall:  No deformities, masses, or tenderness noted. Lungs:  Normal respiratory effort, chest expands symmetrically. Lungs are clear to auscultation, no crackles or wheezes. Heart:  Normal rate and regular rhythm. S1 and S2 normal without gallop, murmur, click, rub or other extra sounds.   Impression & Recommendations:  Problem # 1:  HYPERTENSION (ICD-401.9)  The following medications were removed from the medication list:    Hydrochlorothiazide 25 Mg Tabs (Hydrochlorothiazide) ..... Once daily Her updated medication list for this problem includes:    Metoprolol Tartrate 50 Mg Tabs (Metoprolol tartrate) .Marland Kitchen... Take 1 tablet by mouth twice a day    Lisinopril 20 Mg Tabs (Lisinopril) ..... Once daily  Problem # 2:  HEADACHE (ICD-784.0)  Her updated medication list for this problem includes:    Maxalt-mlt 10 Mg Tbdp (Rizatriptan benzoate) .Marland Kitchen... As needed    Metoprolol Tartrate 50 Mg Tabs (Metoprolol tartrate) .Marland Kitchen... Take 1 tablet by mouth twice  a day    Vicodin 5-500 Mg Tabs (Hydrocodone-acetaminophen) .Marland Kitchen... 1 q 6 hours as needed pain    Aspirin 81 Mg Tbec (Aspirin) ..... Once daily  Problem # 3:  CHEST PAIN (ICD-786.50)  Complete Medication List: 1)  Maxalt-mlt 10 Mg  Tbdp (Rizatriptan benzoate) .... As needed 2)  Lorazepam 1 Mg Tabs (Lorazepam) .Marland Kitchen.. 1 every 6 hours as needed anxiety 3)  Metoprolol Tartrate 50 Mg Tabs (Metoprolol tartrate) .... Take 1 tablet by mouth twice a day 4)  Nexium 40 Mg Cpdr (Esomeprazole magnesium) .... Once daily 5)  Zolpidem Tartrate 10 Mg Tabs (Zolpidem tartrate) .Marland Kitchen.. 1 by mouth at bedtime 6)  Vicodin 5-500 Mg Tabs (Hydrocodone-acetaminophen) .Marland Kitchen.. 1 q 6 hours as needed pain 7)  Lisinopril 20 Mg Tabs (Lisinopril) .... Once daily 8)  Promethazine Hcl 25 Mg Tabs (Promethazine hcl) .Marland Kitchen.. 1 q 4 hours as needed nausea 9)  Aspirin 81 Mg Tbec (Aspirin) .... Once daily  Patient Instructions: 1)  We will increase Lisinopril to 20 mg a day. Add 81 mg ASA daily. Follow up with cardiology. I did clear her to return to work tomorrow.  Prescriptions: VICODIN 5-500 MG TABS (HYDROCODONE-ACETAMINOPHEN) 1 q 6 hours as needed pain  #60 x 5   Entered and Authorized by:   Nelwyn Salisbury MD   Signed by:   Nelwyn Salisbury MD on 05/20/2010   Method used:   Print then Give to Patient   RxID:   [id number] PROMETHAZINE HCL 25 MG TABS (PROMETHAZINE HCL) 1 q 4 hours as needed nausea  #30 x 5   Entered and Authorized by:   Nelwyn Salisbury MD   Signed by:   Nelwyn Salisbury MD on 05/20/2010   Method used:   Electronically to        [facility] [phone]* (retail)       [address] [place], [place]  [address]       Ph: [phone] or [phone]       Fax: [phone]   RxID:   [id number] LISINOPRIL 20 MG TABS (LISINOPRIL) once daily  #30 x 11   Entered and Authorized by:   Nelwyn Salisbury MD   Signed by:   Nelwyn Salisbury MD on 05/20/2010   Method used:   Electronically to        [facility] [phone]* (retail)       [address] [place], [place]  [address]       Ph: [phone] or [phone]       Fax: [phone]   RxID:   [id number]

## 2010-12-10 ENCOUNTER — Encounter: Payer: Self-pay | Admitting: Family Medicine

## 2010-12-16 ENCOUNTER — Encounter: Payer: Self-pay | Admitting: Family Medicine

## 2010-12-16 ENCOUNTER — Ambulatory Visit (INDEPENDENT_AMBULATORY_CARE_PROVIDER_SITE_OTHER): Payer: BC Managed Care – PPO | Admitting: Family Medicine

## 2010-12-16 DIAGNOSIS — R51 Headache: Secondary | ICD-10-CM

## 2010-12-16 DIAGNOSIS — F419 Anxiety disorder, unspecified: Secondary | ICD-10-CM

## 2010-12-16 DIAGNOSIS — F411 Generalized anxiety disorder: Secondary | ICD-10-CM

## 2010-12-16 DIAGNOSIS — K219 Gastro-esophageal reflux disease without esophagitis: Secondary | ICD-10-CM

## 2010-12-16 MED ORDER — RIZATRIPTAN BENZOATE 10 MG PO TABS: 10.0000 mg | ORAL_TABLET | ORAL | Status: DC | PRN

## 2010-12-16 MED ORDER — ESOMEPRAZOLE MAGNESIUM 40 MG PO CPDR: 40.0000 mg | DELAYED_RELEASE_CAPSULE | Freq: Every day | ORAL | Status: DC

## 2010-12-16 MED ORDER — LORAZEPAM 1 MG PO TABS: 1.0000 mg | ORAL_TABLET | Freq: Four times a day (QID) | ORAL | Status: DC | PRN

## 2010-12-16 MED ORDER — HYDROCODONE-ACETAMINOPHEN 5-500 MG PO TABS: 1.0000 | ORAL_TABLET | Freq: Four times a day (QID) | ORAL | Status: DC | PRN

## 2010-12-16 NOTE — Progress Notes (Signed)
  Subjective:    Patient ID: Caroline Boone, female    DOB: [birth year], 45 y.o.   MRN: [id number]  HPI Here for some med refills. She is out of Lorazepam, and she has been dealing with the recent death of her mother and other issues. Her GERD has out of control, but she admits to taking her Nexium only sporadically. She has had some intermittent LUQ pains lately, no nausea. Her BMs are regular.    Review of Systems  Constitutional: Negative.   Respiratory: Negative.   Cardiovascular: Negative.   Gastrointestinal: Positive for abdominal pain.  Psychiatric/Behavioral: Negative for sleep disturbance and dysphoric mood. The patient is nervous/anxious.        Objective:   Physical Exam  Constitutional: She appears well-developed and well-nourished.  Cardiovascular: Normal rate, regular rhythm and normal heart sounds.   Pulmonary/Chest: Effort normal and breath sounds normal.  Abdominal: Bowel sounds are normal. She exhibits no distension and no mass. There is no tenderness. There is no rebound and no guarding.  Psychiatric: She has a normal mood and affect. Her behavior is normal. Judgment and thought content normal.          Assessment & Plan:  The LUQ pain is consistent with some gastritis. Get back on Nexium every day.

## 2011-01-03 LAB — COMPREHENSIVE METABOLIC PANEL
AST: 19 U/L (ref 0–37)
CO2: 26 mEq/L (ref 19–32)
Calcium: 8.3 mg/dL — ABNORMAL LOW (ref 8.4–10.5)
Chloride: 108 mEq/L (ref 96–112)
Creatinine, Ser: 0.57 mg/dL (ref 0.4–1.2)
GFR calc non Af Amer: >60 mL/min (ref >60)
Glucose, Bld: 84 mg/dL (ref 70–99)
Total Bilirubin: 0.5 mg/dL (ref 0.3–1.2)

## 2011-01-03 LAB — POCT I-STAT, CHEM 8
Calcium, Ion: 1.15 mmol/L (ref 1.12–1.32)
Creatinine, Ser: 0.7 mg/dL (ref 0.4–1.2)
Glucose, Bld: 94 mg/dL (ref 70–99)
Hemoglobin: 15.3 g/dL — ABNORMAL HIGH (ref 12.0–15.0)
Sodium: 142 mEq/L (ref 135–145)
TCO2: 25 mmol/L (ref 0–100)

## 2011-01-03 LAB — DIFFERENTIAL
Eosinophils Absolute: 0.1 10*3/uL (ref 0.0–0.7)
Eosinophils Relative: 2 % (ref 0–5)
Lymphs Abs: 2.4 10*3/uL (ref 0.7–4.0)
Monocytes Relative: 5 % (ref 3–12)

## 2011-01-03 LAB — LIPID PANEL
Cholesterol: 176 mg/dL (ref 0–200)
HDL: 50 mg/dL (ref >39)
Total CHOL/HDL Ratio: 3.5 RATIO

## 2011-01-03 LAB — TROPONIN I: Troponin I: <0.01 ng/mL (ref 0.00–0.06)

## 2011-01-03 LAB — CK TOTAL AND CKMB (NOT AT ARMC): Relative Index: INVALID (ref 0.0–2.5)

## 2011-01-03 LAB — CBC
MCH: 33.1 pg (ref 26.0–34.0)
MCV: 95.5 fL (ref 78.0–100.0)
Platelets: 279 10*3/uL (ref 150–400)
RBC: 4.35 MIL/uL (ref 3.87–5.11)

## 2011-01-03 LAB — CARDIAC PANEL(CRET KIN+CKTOT+MB+TROPI)
CK, MB: 0.6 ng/mL (ref 0.3–4.0)
CK, MB: 0.8 ng/mL (ref 0.3–4.0)
Relative Index: INVALID (ref 0.0–2.5)
Relative Index: INVALID (ref 0.0–2.5)
Troponin I: 0.01 ng/mL (ref 0.00–0.06)
Troponin I: <0.01 ng/mL (ref 0.00–0.06)

## 2011-01-03 LAB — APTT: aPTT: 25 seconds (ref 24–37)

## 2011-01-03 LAB — PROTIME-INR: Prothrombin Time: 12.2 seconds (ref 11.6–15.2)

## 2011-01-07 LAB — COMPREHENSIVE METABOLIC PANEL
ALT: 14 U/L (ref 0–35)
AST: 16 U/L (ref 0–37)
Alkaline Phosphatase: 57 U/L (ref 39–117)
CO2: 29 mEq/L (ref 19–32)
Chloride: 110 mEq/L (ref 96–112)
GFR calc Af Amer: >60 mL/min (ref >60)
GFR calc non Af Amer: >60 mL/min (ref >60)
Glucose, Bld: 86 mg/dL (ref 70–99)
Sodium: 142 mEq/L (ref 135–145)
Total Bilirubin: 0.5 mg/dL (ref 0.3–1.2)

## 2011-01-07 LAB — DIFFERENTIAL
Basophils Absolute: 0 10*3/uL (ref 0.0–0.1)
Basophils Relative: 1 % (ref 0–1)
Eosinophils Absolute: 0.2 10*3/uL (ref 0.0–0.7)
Eosinophils Relative: 2 % (ref 0–5)
Neutrophils Relative %: 53 % (ref 43–77)

## 2011-01-07 LAB — LIPASE, BLOOD: Lipase: 33 U/L (ref 11–59)

## 2011-01-07 LAB — CBC
Hemoglobin: 13.9 g/dL (ref 12.0–15.0)
RBC: 4.16 MIL/uL (ref 3.87–5.11)
WBC: 8.3 10*3/uL (ref 4.0–10.5)

## 2011-01-07 LAB — POCT CARDIAC MARKERS: Troponin i, poc: <0.05 ng/mL (ref 0.00–0.09)

## 2011-01-22 LAB — POCT I-STAT, CHEM 8
BUN: 6 mg/dL (ref 6–23)
Calcium, Ion: 1.18 mmol/L (ref 1.12–1.32)
Chloride: 107 meq/L (ref 96–112)
Creatinine, Ser: 0.7 mg/dL (ref 0.4–1.2)
Glucose, Bld: 96 mg/dL (ref 70–99)
HCT: 46 % (ref 36.0–46.0)
Hemoglobin: 15.6 g/dL — ABNORMAL HIGH (ref 12.0–15.0)
Potassium: 3.5 meq/L (ref 3.5–5.1)
Sodium: 140 meq/L (ref 135–145)
TCO2: 24 mmol/L (ref 0–100)

## 2011-01-22 LAB — DIFFERENTIAL
Basophils Relative: 1 % (ref 0–1)
Eosinophils Relative: 2 % (ref 0–5)
Monocytes Absolute: 0.5 10*3/uL (ref 0.1–1.0)
Monocytes Relative: 6 % (ref 3–12)
Neutro Abs: 4.7 10*3/uL (ref 1.7–7.7)

## 2011-01-22 LAB — CBC
HCT: 43.8 % (ref 36.0–46.0)
Hemoglobin: 15 g/dL (ref 12.0–15.0)
MCHC: 34.3 g/dL (ref 30.0–36.0)
RBC: 4.52 MIL/uL (ref 3.87–5.11)

## 2011-01-22 LAB — POCT CARDIAC MARKERS
CKMB, poc: <1 ng/mL — ABNORMAL LOW (ref 1.0–8.0)
Myoglobin, poc: 47.6 ng/mL (ref 12–200)
Troponin i, poc: <0.05 ng/mL (ref 0.00–0.09)

## 2011-01-22 LAB — COMPREHENSIVE METABOLIC PANEL
ALT: 30 U/L (ref 0–35)
AST: 24 U/L (ref 0–37)
CO2: 24 mEq/L (ref 19–32)
Chloride: 104 mEq/L (ref 96–112)
Creatinine, Ser: 0.7 mg/dL (ref 0.4–1.2)
GFR calc Af Amer: >60 mL/min (ref >60)
GFR calc non Af Amer: >60 mL/min (ref >60)
Sodium: 136 mEq/L (ref 135–145)
Total Bilirubin: 0.8 mg/dL (ref 0.3–1.2)

## 2011-01-22 LAB — GLUCOSE, CAPILLARY: Glucose-Capillary: 92 mg/dL (ref 70–99)

## 2011-03-03 NOTE — Assessment & Plan Note (Signed)
[facility]                            CARDIOLOGY OFFICE NOTE   TANZA, PELLOT                       MRN:          [id number]  DATE:10/01/2008                            DOB:          1965/12/04    PRIMARY CARE PHYSICIAN:  Bryan Lemma. Ehinger, MD   REASON FOR PRESENTATION:  Evaluate the patient with hypertension and  palpitations.   HISTORY OF PRESENT ILLNESS:  The patient called today to add herself to  the schedule.  She is 45 year old.  She has a long history of  hypertension and palpitations.  We have been treating her with a higher  dose of Toprol since her last visit.  She said she is not getting the  palpitations that she was having.  She still occasionally feels her  heart beating in her head.  She noticed that this weekend in particular.  On Friday at work, she was noted to have a blood pressure of 140/110.  This morning, she had another blood pressure of 170/128 that was  repeated and was down to 140/110.  The initial was while seated, and the  second was while standing.  She has felt somewhat fatigued with this.  She has not had any chest pressure, neck or arm discomfort.  She has not  had any new shortness of breath, PND, or orthopnea.  She has had some  mild headaches that have not been typical migraine.  She has not had any  blurred vision, loss of speech, or motor.  She came today to be  evaluated for this.   PAST MEDICAL HISTORY:  1. Hypertension, borderline in the past.  2. Palpitations.  3. Migraines.  4. Breast augmentation.   ALLERGIES/INTOLERANCES:  None.   MEDICATIONS:  1. Metoprolol 25 mg b.i.d.  2. Nexium.   REVIEW OF SYSTEMS:  As stated in the HPI and otherwise negative for all  other systems.   PHYSICAL EXAMINATION:  GENERAL:  The patient is pleasant and in no  distress.  VITAL SIGNS:  Blood pressure 154/94, heart rate 78 and regular, weight  147 pounds.  HEENT:  Eyes unremarkable, pupils equal, round, and  reactive to light,  fundi not visualized, oral mucosa unremarkable.  NECK:  No jugular venous distention at 45 degrees, carotid upstroke  brisk and symmetric, left carotid bruit, no thyromegaly.  LYMPHATICS:  No cervical, axillary, or inguinal adenopathy.  LUNGS:  Clear to auscultation bilaterally.  BACK:  No costovertebral angle tenderness.  CHEST:  Unremarkable.  HEART:  PMI not displaced or sustained, S1 and S2 within normal limits,  no S3, no S4, no clicks, no rubs, no murmurs.  ABDOMEN:  Flat, positive bowel sounds, normal in frequency and pitch, no  bruits, no rebound, no guarding, no midline pulsatile mass, no  hepatomegaly, no splenomegaly.  SKIN:  No rashes, no nodules.  EXTREMITIES:  Pulses 2+ throughout, no edema, no cyanosis, no clubbing.  NEUROLOGIC:  Oriented to person, place, and time, cranial nerves II  through XII grossly intact, motor grossly intact.   EKG; sinus rhythm, rate 75, axis within normal limits, intervals  within  normal limits, no acute ST-T wave changes.   ASSESSMENT AND PLAN:  1. Hypertension.  Blood pressure is elevated.  Nothing has really      changed in her life other than her daughter and 69-month-old      grandchild are now living with her.  This could have been the      impetus to push her pressures over the age.  At this point, I am      going to start just by increasing metoprolol to 50 mg twice a day.      Hopefully, she would not feel increased fatigue with this.  She is      going to take this for a couple of weeks and let me know how her      blood pressures are doing.  The next step might be to add a low      dose of hydrochlorothiazide.  2. Palpitations.  She is feeling the strong heartbeat in her ears,      but not any irregular heartbeat.  I will manage this again with the      increased dose of beta-blocker.  3. Followup.  I will see her back in about a week since I am making      this change, but we will address any issues over the  phone as well.      Ultimately, she will follow with Dr. Manus Gunning for continued      management of this issue.     Rollene Rotunda, MD, [facility]  Electronically Signed    JH/MedQ  DD: 10/01/2008  DT: 10/02/2008  Job #: [id number]   cc:   Bryan Lemma. Manus Gunning, M.D.

## 2011-03-03 NOTE — Assessment & Plan Note (Signed)
[facility]                            CARDIOLOGY OFFICE NOTE   Caroline, Boone                       MRN:          [id number]  DATE:01/02/2008                            DOB:          [birth year]    PRIMARY CARE PHYSICIAN:  Bryan Lemma. Manus Gunning, M.D.   REASON FOR PRESENTATION:  Evaluate patient with hypertension and a  strong heartbeat.   HISTORY OF PRESENT ILLNESS:  The patient is a pleasant 45 year old whom  I did see in the past for palpitations.  There was a questionable  carotid bruit and she has had no significant plaque in her carotids in  the past.  She has had chest pain.  She had a stress perfusion study in  2005 demonstrating no evidence of ischemia or infarction and a well-  preserved ejection fraction of 66%.   Since I saw her, she has had no new palpitations.  She gets rare  irregular beats, but these are not particularly bothersome.  She was  bothered recently by a headache.  She had a hypertensive response with  this.  She was seen and treated by Dr. Manus Gunning.  She was at that time  describing a very strong heartbeat in her ears.  This was not  particularly fast or irregular.  It was just a pounding in her ears that  kept her awake at night.  This has been ongoing despite resolution of  the migraine.  She has also noted at home that her blood pressure has  been higher than usual; she reports in the 150s/90s.  She has been  having a little shortness of breath, feeling like she cannot take a  deep breath.  She is not describing dyspnea with exertion.  She is not  describing any PND or orthopnea.  She is not having any chest pressure,  neck or arm discomfort.   PAST MEDICAL HISTORY:  1. Borderline hypertension in the past.  2. Palpitations.  3. Migraines.   PAST SURGICAL HISTORY:  Breast augmentation.   ALLERGIES:  None.   MEDICATIONS:  1. Toprol 25 mg daily.  2. Nexium.   SOCIAL HISTORY:  The patient is divorced.  She has  two children and now  two grandchildren.  She works at [employer].  She does walk  occasionally for exercise but has not done this recently.  She does not  smoke cigarettes.  She rarely drinks alcohol.   FAMILY HISTORY:  Noncontributory for early coronary artery disease.   REVIEW OF SYSTEMS:  As stated in HPI, and otherwise negative for other  systems.   PHYSICAL EXAMINATION:  GENERAL:  The patient is in no acute distress.  VITAL SIGNS:  Blood pressure 131/78, heart rate 75 and regular, weight  150 pounds.  HEENT:  Eyelids unremarkable.  Pupils equal, round, reactive to light.  Fundi within normal limits.  Oral mucosa unremarkable.  NECK:  No jugular distention at 45 degrees.  Carotid upstroke brisk and  symmetric.  No bruits, no thyromegaly.  LYMPHATICS:  No cervical, axillary, inguinal adenopathy.  LUNGS:  Clear to auscultation bilaterally.  BACK:  No costovertebral angle tenderness.  CHEST:  Unremarkable.  HEART:  PMI not displaced or sustained.  S1-S2 within normal limits.  No  S3, no S4, no clicks, no rubs, no murmurs.  ABDOMEN:  Flat, positive bowel sounds.  Normal in frequency and pitch.  No bruits, rebound, guarding.  No midline pulsatile mass.  No  hepatomegaly, no splenomegaly.  SKIN:  No rashes, no nodules.  EXTREMITIES:  2+ pulses throughout.  No edema, no cyanosis, no clubbing.  NEUROLOGIC:  Oriented to person, place and time.  Cranial nerves II-XII  grossly intact, motor grossly intact.   ELECTROCARDIOGRAM:  Sinus rhythm, rate 70.  Axis within normal limits,  intervals within normal limits, no acute ST-T wave changes.   ASSESSMENT/PLAN:  1. Palpitations.  The patient is describing actually a strong      heartbeat in her ears.  Coincidentally, she has had elevated blood      pressures when she had been taking it at home.  This may well be      related.  At this point, I think treating her with an increased      dose of metoprolol 25 mg b.i.d. would be  reasonable.  If her blood      pressure remains high, it might be prudent to add a low dose of a      diuretic.  If then her symptoms did not resolve with this, I would      suggest follow up with an ENT.  I did look in her ears and did not      see any abnormalities in front of or behind the tympanic membranes.  2. Hypertension as above.  3. Dyspnea.  The patient is describing difficulty taking a deep      breath, but not really dyspnea on exertion, PND or orthopnea.  I      doubt that any current further cardiovascular testing would be      revealing.  4. Follow up would be based on the results of the above.     Rollene Rotunda, MD, [facility]  Electronically Signed    JH/MedQ  DD: 01/02/2008  DT: 01/02/2008  Job #: [id number]   cc:   Bryan Lemma. Manus Gunning, M.D.

## 2011-03-03 NOTE — Assessment & Plan Note (Signed)
[facility]                            CARDIOLOGY OFFICE NOTE   Caroline Boone, Caroline Boone                       MRN:          [id number]  DATE:10/29/2008                            DOB:          24-Dec-1965    PRIMARY CARE PHYSICIAN:  Tera Mater. Clent Ridges, MD.   REASON FOR PRESENTATION:  Evaluate the patient with hypertension.   HISTORY OF PRESENT ILLNESS:  The patient presents for followup of the  above.  At the last visit, I increased her metoprolol to 50 mg b.i.d.  from 25 mg twice a day.  She states this has helped.  It took several  days, but now she states that her blood pressure is much better  controlled.  She is not having palpitation she was having.  She was a  little fatigued.  This is not particularly problematic.  She has had no  presyncope or syncope.  She has had no chest pain or shortness of  breath.  She is still having some stress at home, but she is working on  relieving this as well.   PAST MEDICAL HISTORY:  1. Hypertension.  2. Palpitations.  3. Migraines.  4. Breast augmentation.   ALLERGIES:  None.   MEDICATIONS:  1. Nexium.  2. Metoprolol 50 mg b.i.d.   REVIEW OF SYSTEMS:  As stated in the HPI and otherwise negative for  other systems.   PHYSICAL EXAMINATION:  GENERAL:  The patient is in no distress.  VITAL SIGNS:  Blood pressure 114/71 and heart rate 71 and regular.  NECK:  No jugular venous distention at 45 degrees.  Carotid upstroke  brisk and symmetric.  No bruits.  No thyromegaly.  LYMPHATICS:  No cervical, axillary, or inguinal adenopathy.  LUNGS:  Clear to auscultation bilaterally.  BACK:  No costovertebral angle tenderness.  CHEST:  Unremarkable.  HEART:  PMI not displaced or sustained.  S1 and S2 within normal limits.  No S3, no S4.  No clicks, no rubs, no murmurs.  ABDOMEN:  Obese, positive bowel sounds, normal in frequency and pitch.  No bruits, no rebound, no guarding.  No midline pulsatile mass.  No  hepatomegaly.  No splenomegaly.  SKIN:  No rashes, no nodules.  EXTREMITIES:  Pulses 2+.  No edema.   ASSESSMENT AND PLAN:  1. Hypertension.  Blood pressure is well controlled on the current      regimen.  She is tolerating this.  No further therapy is warranted.  2. Palpitations.  These seem to be controlled as well.  She will      continue on the beta-blocker as listed.  3. Followup.  I will see her back in 6 months and then possibly as      needed thereafter.     Rollene Rotunda, MD, [facility]  Electronically Signed    JH/MedQ  DD: 10/29/2008  DT: 10/30/2008  Job #: [id number]   cc:   Jeannett Senior A. Clent Ridges, MD

## 2011-03-10 ENCOUNTER — Telehealth: Payer: Self-pay | Admitting: Cardiology

## 2011-03-10 NOTE — Telephone Encounter (Signed)
Please fax lab orders to [phone] att [facility] if needed before appt 6-1 so she can do at her office

## 2011-03-11 NOTE — Telephone Encounter (Signed)
Will forward to MD for lab orders if needed.

## 2011-03-11 NOTE — Telephone Encounter (Signed)
The patient should come back for a fasting lipid profile and CMET.

## 2011-03-13 NOTE — Telephone Encounter (Signed)
Order faxed as requested

## 2011-03-20 ENCOUNTER — Encounter: Payer: Self-pay | Admitting: Physician Assistant

## 2011-03-20 ENCOUNTER — Ambulatory Visit (INDEPENDENT_AMBULATORY_CARE_PROVIDER_SITE_OTHER): Payer: BC Managed Care – PPO | Admitting: Physician Assistant

## 2011-03-20 VITALS — BP 100/70 | HR 96 | Ht 64.0 in | Wt 155.0 lb

## 2011-03-20 DIAGNOSIS — R0602 Shortness of breath: Secondary | ICD-10-CM | POA: Insufficient documentation

## 2011-03-20 DIAGNOSIS — R5381 Other malaise: Secondary | ICD-10-CM

## 2011-03-20 DIAGNOSIS — E785 Hyperlipidemia, unspecified: Secondary | ICD-10-CM

## 2011-03-20 DIAGNOSIS — R0989 Other specified symptoms and signs involving the circulatory and respiratory systems: Secondary | ICD-10-CM

## 2011-03-20 DIAGNOSIS — R5383 Other fatigue: Secondary | ICD-10-CM | POA: Insufficient documentation

## 2011-03-20 DIAGNOSIS — R079 Chest pain, unspecified: Secondary | ICD-10-CM

## 2011-03-20 DIAGNOSIS — I10 Essential (primary) hypertension: Secondary | ICD-10-CM

## 2011-03-20 LAB — CBC WITH DIFFERENTIAL/PLATELET
Basophils Absolute: 0 10*3/uL (ref 0.0–0.1)
HCT: 39.7 % (ref 36.0–46.0)
Lymphs Abs: 2.5 10*3/uL (ref 0.7–4.0)
MCHC: 34.6 g/dL (ref 30.0–36.0)
MCV: 95.5 fl (ref 78.0–100.0)
Monocytes Absolute: 0.4 10*3/uL (ref 0.1–1.0)
Platelets: 313 10*3/uL (ref 150.0–400.0)
RDW: 12.7 % (ref 11.5–14.6)

## 2011-03-20 LAB — BRAIN NATRIURETIC PEPTIDE: Pro B Natriuretic peptide (BNP): 14 pg/mL (ref 0.0–100.0)

## 2011-03-20 LAB — TSH: TSH: 0.46 u[IU]/mL (ref 0.35–5.50)

## 2011-03-20 NOTE — Patient Instructions (Signed)
Your physician recommends that you schedule a follow-up appointment in: 05/01/11 DR. HOCHREIN @ 3:30 AS PER SCOTT WEAVER, PA-C  Your physician recommends that you return for lab work in: TODAY BNP, CBC W/DIFF, TSH 786.05, 780.79.  Your physician has requested that you have a BILATERAL carotid duplex 785.9 bruit. This test is an ultrasound of the carotid arteries in your neck. It looks at blood flow through these arteries that supply the brain with blood. Allow one hour for this exam. There are no restrictions or special instructions.  Your physician has requested that you have an echocardiogram 786.05, 780.79. Echocardiography is a painless test that uses sound waves to create images of your heart. It provides your doctor with information about the size and shape of your heart and how well your heart's chambers and valves are working. This procedure takes approximately one hour. There are no restrictions for this procedure.

## 2011-03-20 NOTE — Assessment & Plan Note (Addendum)
Atypical.  EKG unchanged.  HR up a little.  She had a neg GXT echo last year.  Proceed with echo and labs for now.  I recommended she talk to her GYN.  She probably should not take estrogen containing OCPs with her age over 35 and h/o HTN.

## 2011-03-20 NOTE — Assessment & Plan Note (Signed)
Controlled.  Continue current therapy.  

## 2011-03-20 NOTE — Assessment & Plan Note (Addendum)
No real signs of CHF on exam.  But, she had moderate LVH on echo last year.  She would be at risk for diastolic failure.  Repeat echo.  If LVF down, she will need a cath.  Check BNP.  If elevated, stop HCTZ and start Lasix.  Labs done 5/29: K 4.7, Creat 0.63.  She reports negative cath in 2007.  Will get records.  Negative stress echo last year.  Do not think she needs another stress test at this time.

## 2011-03-20 NOTE — Assessment & Plan Note (Signed)
As above.  Get CBC and TSH as well.

## 2011-03-20 NOTE — Progress Notes (Signed)
History of Present Illness: Primary Cardiologist:  Dr. Rollene Rotunda  Caroline Boone is a 45 y.o. female with a h/o HTN and palpitations followed by Dr. Antoine Poche presents for annual follow up.  She complains of fatigue and dyspnea with exertion over the last week.  She's been sleeping on 3 pillows.  She denies PND.  She denies pedal edema.  She describes NYHA class II symptoms.  She has some occasional chest discomfort on the left.  This is brief, less than seconds.  It is nonexertional.  She has had increased bloating and belching.  She denies dysphagia.  GERD takes Nexium.  She denies syncope.  There are no radiating symptoms.  She has been nauseated she takes Phenergan.  She had some elevated BPs last week.  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  She did this for a while, but is back to taking Lisinopril 10 mg with her HCTZ.  Past Medical History  Diagnosis Date  . Palpitations     hx  . Hypertension     a. echo 7/11: mod LVH, EF 55-65%  . GERD (gastroesophageal reflux disease)   . Insomnia   . Anxiety   . Headache   . History of cardiac cath 2007    normal  . Chest pain     GXT echo 8/11: normal LVF, no ischemia;  h/o neg. myoview in 2005    Current Outpatient Prescriptions  Medication Sig Dispense Refill  . aspirin 81 MG tablet Take 81 mg by mouth daily.        Marland Kitchen esomeprazole (NEXIUM) 40 MG capsule Take 1 capsule (40 mg total) by mouth daily before breakfast.  30 capsule  11  . hydrochlorothiazide 25 MG tablet Take 25 mg by mouth daily.        Marland Kitchen HYDROcodone-acetaminophen (VICODIN) 5-500 MG per tablet Take 1 tablet by mouth every 6 (six) hours as needed for pain.  60 tablet  5  . levonorgestrel-ethinyl estradiol (PORTIA-28) 0.15-30 MG-MCG per tablet Take 1 tablet by mouth daily.        Marland Kitchen lisinopril (PRINIVIL,ZESTRIL) 20 MG tablet Take 10 mg by mouth daily. Take extra 10 mg at bedtime if BP is up      . LORazepam  (ATIVAN) 1 MG tablet Take 1 tablet (1 mg total) by mouth every 6 (six) hours as needed for anxiety.  60 tablet  5  . promethazine (PHENERGAN) 25 MG tablet Take 25 mg by mouth 4 (four) times daily as needed.        . rizatriptan (MAXALT) 10 MG tablet Take 1 tablet (10 mg total) by mouth as needed for migraine. May repeat in 2 hours if needed  6 tablet  11  . zolpidem (AMBIEN) 10 MG tablet Take 10 mg by mouth at bedtime as needed.        Marland Kitchen DISCONTD: labetalol (NORMODYNE) 100 MG tablet Take 100 mg by mouth daily.          Allergies: Allergies  Allergen Reactions  . Amoxicillin   . Sulfonamide Derivatives    History  Substance Use Topics  . Smoking status: Never Smoker   . Smokeless tobacco: Not on file  . Alcohol Use: No    Family History  Problem Relation Age of Onset  . Arthritis      family hx  . Breast cancer      relative ,50  .  Diabetes      family hx  . Hypertension      family hx  . Kidney disease      family hx  . Lung cancer      family hx  . Coronary artery disease Mother   . Heart attack Father 25    CABG  . Kidney failure Father     ROS:  See history of present illness.  She has had a 10 pound weight gain in about a month.  She denies fevers, chills, cough, melena, hematochezia.  All other systems reviewed are negative.  Vital Signs: BP 100/70  Pulse 96  Ht 5\' 4"  (1.626 m)  Wt 155 lb (70.308 kg)  BMI 26.61 kg/m2  PHYSICAL EXAM: Well nourished, well developed, in no acute distress HEENT: normal Neck: no JVD Vascular: + left carotid bruit Cardiac:  normal S1, S2; RRR; no murmur Lungs:  clear to auscultation bilaterally, no wheezing, rhonchi or rales Abd: soft, nontender, no hepatomegaly Ext: no edema Skin: warm and dry Neuro:  CNs 2-12 intact, no focal abnormalities noted Psych: normal affect  EKG:  Sinus rhythm, HR 96, normal axis, PRWP, NSSTTW changes  ASSESSMENT AND PLAN:

## 2011-03-20 NOTE — Assessment & Plan Note (Signed)
Labs 5/29: LDL 115.  Recommend LDL < 100 with risk factors.  She can try diet and exercise first.

## 2011-03-20 NOTE — Assessment & Plan Note (Signed)
Check carotid dopplers 

## 2011-03-23 ENCOUNTER — Other Ambulatory Visit: Payer: Self-pay

## 2011-03-25 ENCOUNTER — Encounter: Payer: Self-pay | Admitting: *Deleted

## 2011-03-30 ENCOUNTER — Encounter: Payer: Self-pay | Admitting: Physician Assistant

## 2011-03-31 ENCOUNTER — Other Ambulatory Visit (HOSPITAL_COMMUNITY): Payer: BC Managed Care – PPO

## 2011-04-01 ENCOUNTER — Ambulatory Visit (HOSPITAL_COMMUNITY): Payer: BC Managed Care – PPO | Attending: Cardiovascular Disease | Admitting: Radiology

## 2011-04-01 ENCOUNTER — Encounter (INDEPENDENT_AMBULATORY_CARE_PROVIDER_SITE_OTHER): Payer: BC Managed Care – PPO | Admitting: *Deleted

## 2011-04-01 DIAGNOSIS — R0609 Other forms of dyspnea: Secondary | ICD-10-CM | POA: Insufficient documentation

## 2011-04-01 DIAGNOSIS — E785 Hyperlipidemia, unspecified: Secondary | ICD-10-CM | POA: Insufficient documentation

## 2011-04-01 DIAGNOSIS — R0989 Other specified symptoms and signs involving the circulatory and respiratory systems: Secondary | ICD-10-CM

## 2011-04-01 DIAGNOSIS — R5381 Other malaise: Secondary | ICD-10-CM | POA: Insufficient documentation

## 2011-04-01 DIAGNOSIS — R5383 Other fatigue: Secondary | ICD-10-CM

## 2011-04-01 DIAGNOSIS — I079 Rheumatic tricuspid valve disease, unspecified: Secondary | ICD-10-CM | POA: Insufficient documentation

## 2011-04-01 DIAGNOSIS — R072 Precordial pain: Secondary | ICD-10-CM | POA: Insufficient documentation

## 2011-04-01 DIAGNOSIS — I10 Essential (primary) hypertension: Secondary | ICD-10-CM | POA: Insufficient documentation

## 2011-04-02 ENCOUNTER — Encounter: Payer: Self-pay | Admitting: Physician Assistant

## 2011-04-03 ENCOUNTER — Encounter: Payer: Self-pay | Admitting: Physician Assistant

## 2011-04-07 ENCOUNTER — Encounter: Payer: Self-pay | Admitting: *Deleted

## 2011-04-07 ENCOUNTER — Other Ambulatory Visit: Payer: Self-pay | Admitting: *Deleted

## 2011-04-07 DIAGNOSIS — R0989 Other specified symptoms and signs involving the circulatory and respiratory systems: Secondary | ICD-10-CM

## 2011-04-08 ENCOUNTER — Other Ambulatory Visit: Payer: Self-pay | Admitting: Family Medicine

## 2011-04-08 ENCOUNTER — Ambulatory Visit (INDEPENDENT_AMBULATORY_CARE_PROVIDER_SITE_OTHER)
Admission: RE | Admit: 2011-04-08 | Discharge: 2011-04-08 | Disposition: A | Discharge status: Home / Self Care | Payer: BC Managed Care – PPO | Source: Ambulatory Visit | Attending: Physician Assistant | Admitting: Physician Assistant

## 2011-04-08 DIAGNOSIS — R0989 Other specified symptoms and signs involving the circulatory and respiratory systems: Secondary | ICD-10-CM

## 2011-04-08 MED ORDER — IOHEXOL 350 MG/ML SOLN
100.0000 mL | Freq: Once | INTRAVENOUS | Status: AC | PRN
  Administered 2011-04-08: 100 mL via INTRAVENOUS

## 2011-04-09 NOTE — Telephone Encounter (Signed)
Call  In #30 with 5 rf

## 2011-04-10 ENCOUNTER — Other Ambulatory Visit: Payer: Self-pay | Admitting: Physician Assistant

## 2011-04-10 ENCOUNTER — Telehealth: Payer: Self-pay | Admitting: *Deleted

## 2011-04-10 ENCOUNTER — Telehealth: Payer: Self-pay | Admitting: Physician Assistant

## 2011-04-10 DIAGNOSIS — I729 Aneurysm of unspecified site: Secondary | ICD-10-CM

## 2011-04-10 MED ORDER — ZOLPIDEM TARTRATE 10 MG PO TABS: 10.0000 mg | ORAL_TABLET | Freq: Every evening | ORAL | Status: DC | PRN

## 2011-04-10 NOTE — Telephone Encounter (Signed)
Caroline Newcomer, PA-C needs to s/w pt about an appt to see Dr. Sharin Grave @ [facility]. lmom @ home/mobile # and put not @ work today. Danielle Rankin

## 2011-04-10 NOTE — Telephone Encounter (Signed)
rtn call to carol

## 2011-04-13 ENCOUNTER — Other Ambulatory Visit (HOSPITAL_COMMUNITY): Payer: Self-pay | Admitting: Interventional Radiology

## 2011-04-13 ENCOUNTER — Ambulatory Visit (HOSPITAL_COMMUNITY)
Admission: RE | Admit: 2011-04-13 | Discharge: 2011-04-13 | Disposition: A | Discharge status: Home / Self Care | Payer: BC Managed Care – PPO | Source: Ambulatory Visit | Attending: Physician Assistant | Admitting: Physician Assistant

## 2011-04-13 DIAGNOSIS — I729 Aneurysm of unspecified site: Secondary | ICD-10-CM

## 2011-04-17 ENCOUNTER — Other Ambulatory Visit (HOSPITAL_COMMUNITY): Payer: BC Managed Care – PPO

## 2011-04-27 ENCOUNTER — Encounter (HOSPITAL_COMMUNITY)
Admission: RE | Admit: 2011-04-27 | Discharge: 2011-04-27 | Disposition: A | Discharge status: Home / Self Care | Payer: BC Managed Care – PPO | Source: Ambulatory Visit | Attending: Interventional Radiology | Admitting: Interventional Radiology

## 2011-04-27 ENCOUNTER — Other Ambulatory Visit (HOSPITAL_COMMUNITY): Payer: Self-pay | Admitting: Interventional Radiology

## 2011-04-27 DIAGNOSIS — I729 Aneurysm of unspecified site: Secondary | ICD-10-CM

## 2011-04-27 LAB — CBC
HCT: 40.5 % (ref 36.0–46.0)
MCH: 31.4 pg (ref 26.0–34.0)
MCV: 92.9 fL (ref 78.0–100.0)
RBC: 4.36 MIL/uL (ref 3.87–5.11)
WBC: 9.3 10*3/uL (ref 4.0–10.5)

## 2011-04-27 LAB — DIFFERENTIAL
Eosinophils Absolute: 0.2 10*3/uL (ref 0.0–0.7)
Eosinophils Relative: 2 % (ref 0–5)
Lymphocytes Relative: 45 % (ref 12–46)
Lymphs Abs: 4.2 10*3/uL — ABNORMAL HIGH (ref 0.7–4.0)
Monocytes Relative: 6 % (ref 3–12)

## 2011-04-27 LAB — BASIC METABOLIC PANEL
CO2: 27 mEq/L (ref 19–32)
Chloride: 102 mEq/L (ref 96–112)
Glucose, Bld: 93 mg/dL (ref 70–99)
Sodium: 138 mEq/L (ref 135–145)

## 2011-04-27 LAB — HCG, SERUM, QUALITATIVE: Preg, Serum: NEGATIVE

## 2011-04-27 LAB — SURGICAL PCR SCREEN
MRSA, PCR: NEGATIVE
Staphylococcus aureus: NEGATIVE

## 2011-04-27 LAB — APTT: aPTT: 27 seconds (ref 24–37)

## 2011-04-29 ENCOUNTER — Ambulatory Visit (HOSPITAL_COMMUNITY)
Admission: RE | Admit: 2011-04-29 | Discharge: 2011-04-29 | Disposition: A | Discharge status: Home / Self Care | Payer: BC Managed Care – PPO | Source: Ambulatory Visit | Attending: Interventional Radiology | Admitting: Interventional Radiology

## 2011-04-29 DIAGNOSIS — I671 Cerebral aneurysm, nonruptured: Secondary | ICD-10-CM | POA: Insufficient documentation

## 2011-04-29 DIAGNOSIS — J701 Chronic and other pulmonary manifestations due to radiation: Secondary | ICD-10-CM | POA: Insufficient documentation

## 2011-04-29 DIAGNOSIS — H9319 Tinnitus, unspecified ear: Secondary | ICD-10-CM | POA: Insufficient documentation

## 2011-04-29 DIAGNOSIS — Z01812 Encounter for preprocedural laboratory examination: Secondary | ICD-10-CM | POA: Insufficient documentation

## 2011-04-29 DIAGNOSIS — F411 Generalized anxiety disorder: Secondary | ICD-10-CM | POA: Insufficient documentation

## 2011-04-29 DIAGNOSIS — G43909 Migraine, unspecified, not intractable, without status migrainosus: Secondary | ICD-10-CM | POA: Insufficient documentation

## 2011-04-29 DIAGNOSIS — Z01818 Encounter for other preprocedural examination: Secondary | ICD-10-CM | POA: Insufficient documentation

## 2011-04-29 DIAGNOSIS — M542 Cervicalgia: Secondary | ICD-10-CM | POA: Insufficient documentation

## 2011-04-29 DIAGNOSIS — I10 Essential (primary) hypertension: Secondary | ICD-10-CM | POA: Insufficient documentation

## 2011-04-29 DIAGNOSIS — I729 Aneurysm of unspecified site: Secondary | ICD-10-CM

## 2011-04-29 LAB — PLATELET INHIBITION P2Y12
P2Y12 % Inhibition: 0 %
Platelet Function  P2Y12: 277 [PRU] (ref 194–418)
Platelet Function Baseline: 264 [PRU] (ref 194–418)

## 2011-04-29 MED ORDER — IOHEXOL 300 MG/ML  SOLN
150.0000 mL | Freq: Once | INTRAMUSCULAR | Status: AC | PRN
  Administered 2011-04-29: 85 mL via INTRAVENOUS

## 2011-05-01 ENCOUNTER — Other Ambulatory Visit (HOSPITAL_COMMUNITY): Payer: Self-pay | Admitting: Interventional Radiology

## 2011-05-01 ENCOUNTER — Encounter: Payer: Self-pay | Admitting: Cardiology

## 2011-05-01 ENCOUNTER — Ambulatory Visit (INDEPENDENT_AMBULATORY_CARE_PROVIDER_SITE_OTHER): Payer: BC Managed Care – PPO | Admitting: Cardiology

## 2011-05-01 DIAGNOSIS — I729 Aneurysm of unspecified site: Secondary | ICD-10-CM

## 2011-05-01 DIAGNOSIS — R079 Chest pain, unspecified: Secondary | ICD-10-CM

## 2011-05-01 DIAGNOSIS — R0989 Other specified symptoms and signs involving the circulatory and respiratory systems: Secondary | ICD-10-CM

## 2011-05-01 DIAGNOSIS — I10 Essential (primary) hypertension: Secondary | ICD-10-CM

## 2011-05-01 NOTE — Assessment & Plan Note (Signed)
Her BP is at target and I will continue the meds as listed.

## 2011-05-01 NOTE — Assessment & Plan Note (Signed)
She continues to have some atypical chest pain. However, there is no evidence for obstructive coronary disease and she had a negative stress test last year. I think further testing is indicated.

## 2011-05-01 NOTE — Patient Instructions (Signed)
Please continue medications as listed  Follow up with Dr Antoine Poche as needed

## 2011-05-01 NOTE — Progress Notes (Signed)
HPI The patient presents for followup after recent discovery of a bruit by Mr. 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 There was some fibromuscular dysplasia than the right as well.  Again, this was discovered incidentally when the patient presented for followup of some nonanginal chest pain. She is now being considered for a "Pipeline" stent repair of this finding.  She reports some chest discomfort that has been similar to previous pain. She did have a negative stress echo last year. I reviewed some hospital records.  She is having none of the headache that she had been. She is having no new shortness of breath PND or orthopnea. There is no report of palpitations, presyncope or syncope.  Allergies  Allergen Reactions  . Amoxicillin   . Sulfonamide Derivatives     Current Outpatient Prescriptions  Medication Sig Dispense Refill  . aspirin 81 MG tablet Take 81 mg by mouth daily.        . clopidogrel (PLAVIX) 75 MG tablet Take 75 mg by mouth daily.        Marland Kitchen esomeprazole (NEXIUM) 40 MG capsule Take 1 capsule (40 mg total) by mouth daily before breakfast.  30 capsule  11  . hydrochlorothiazide 25 MG tablet Take 25 mg by mouth daily.        Marland Kitchen HYDROcodone-acetaminophen (VICODIN) 5-500 MG per tablet Take 1 tablet by mouth every 6 (six) hours as needed for pain.  60 tablet  5  . lisinopril (PRINIVIL,ZESTRIL) 20 MG tablet Take 10 mg by mouth daily. Take extra 10 mg at bedtime if BP is up      . LORazepam (ATIVAN) 1 MG tablet Take 1 tablet (1 mg total) by mouth every 6 (six) hours as needed for anxiety.  60 tablet  5  . promethazine (PHENERGAN) 25 MG tablet Take 25 mg by mouth 4 (four) times daily as needed.        . rizatriptan (MAXALT) 10 MG tablet Take 1 tablet (10 mg total) by mouth as needed for migraine. May repeat in 2 hours if needed  6 tablet   11  . levonorgestrel-ethinyl estradiol (PORTIA-28) 0.15-30 MG-MCG per tablet Take 1 tablet by mouth daily.        Marland Kitchen zolpidem (AMBIEN) 10 MG tablet Take 1 tablet (10 mg total) by mouth at bedtime as needed.  30 tablet  5    Past Medical History  Diagnosis Date  . Palpitations     hx  . Hypertension     a. echo 7/11: mod LVH, EF 55-65%  . GERD (gastroesophageal reflux disease)   . Insomnia   . Anxiety   . Headache   . History of cardiac cath 2007    cath 11/25/05 by Dr. Jenne Campus with normal cors and EF 50%  . Chest pain     GXT echo 8/11: normal LVF, no ischemia;  h/o neg. myoview in 2005  . Carotid stenosis     Dopplers 6/12:0-39% bilateral; distal ICAs with marked tortuosity; CT angiogram recommended    Past Surgical History  Procedure Date  . Breast enhancement surgery     ROS:  As stated in the HPI and negative for all other systems.  PHYSICAL EXAM BP 137/86  Pulse 99  Ht 5\' 4"  (1.626 m)  Wt 155 lb (70.308 kg)  BMI 26.61 kg/m2  LMP 03/21/2011 GENERAL:  Well  appearing HEENT:  Pupils equal round and reactive, fundi not visualized, oral mucosa unremarkable NECK:  No jugular venous distention, waveform within normal limits, carotid upstroke brisk and symmetric, loud left carotid bruit, no thyromegaly LYMPHATICS:  No cervical, inguinal adenopathy LUNGS:  Clear to auscultation bilaterally BACK:  No CVA tenderness CHEST:  Unremarkable HEART:  PMI not displaced or sustained,S1 and S2 within normal limits, no S3, no S4, no clicks, no rubs, no murmurs ABD:  Flat, positive bowel sounds normal in frequency in pitch, no bruits, no rebound, no guarding, no midline pulsatile mass, no hepatomegaly, no splenomegaly EXT:  2 plus pulses throughout, no edema, no cyanosis no clubbing SKIN:  No rashes no nodules NEURO:  Cranial nerves II through XII grossly intact, motor grossly intact throughout PSYCH:  Cognitively intact, oriented to person place and time  ASSESSMENT AND PLAN

## 2011-05-01 NOTE — Assessment & Plan Note (Signed)
She now has a known pseudoaneurysm and is being evaluated by interventional radiology for closure of this. I will plan procedure at her request. She does have followup with interventional radiology to further discuss this procedure.

## 2011-05-04 ENCOUNTER — Telehealth: Payer: Self-pay | Admitting: Physician Assistant

## 2011-05-04 ENCOUNTER — Telehealth: Payer: Self-pay | Admitting: Cardiology

## 2011-05-04 NOTE — Telephone Encounter (Signed)
All Cardiac faxed to [facility] @ [phone]  05/04/11/km

## 2011-05-07 ENCOUNTER — Ambulatory Visit (HOSPITAL_COMMUNITY): Payer: BC Managed Care – PPO

## 2011-05-14 NOTE — Telephone Encounter (Signed)
Error

## 2011-05-22 ENCOUNTER — Emergency Department (HOSPITAL_COMMUNITY): Payer: BC Managed Care – PPO

## 2011-05-22 ENCOUNTER — Encounter (HOSPITAL_COMMUNITY): Payer: Self-pay | Admitting: Radiology

## 2011-05-22 ENCOUNTER — Emergency Department (HOSPITAL_COMMUNITY)
Admission: EM | Admit: 2011-05-22 | Discharge: 2011-05-22 | Disposition: A | Discharge status: Home / Self Care | Payer: BC Managed Care – PPO | Attending: Emergency Medicine | Admitting: Emergency Medicine

## 2011-05-22 DIAGNOSIS — I10 Essential (primary) hypertension: Secondary | ICD-10-CM | POA: Insufficient documentation

## 2011-05-22 DIAGNOSIS — H532 Diplopia: Secondary | ICD-10-CM | POA: Insufficient documentation

## 2011-05-22 DIAGNOSIS — H53149 Visual discomfort, unspecified: Secondary | ICD-10-CM | POA: Insufficient documentation

## 2011-05-22 DIAGNOSIS — I671 Cerebral aneurysm, nonruptured: Secondary | ICD-10-CM | POA: Insufficient documentation

## 2011-05-22 DIAGNOSIS — R51 Headache: Secondary | ICD-10-CM | POA: Insufficient documentation

## 2011-05-22 DIAGNOSIS — H538 Other visual disturbances: Secondary | ICD-10-CM | POA: Insufficient documentation

## 2011-05-22 MED ORDER — IOHEXOL 350 MG/ML SOLN
50.0000 mL | Freq: Once | INTRAVENOUS | Status: AC | PRN
  Administered 2011-05-22: 50 mL via INTRAVENOUS

## 2011-06-01 ENCOUNTER — Encounter: Payer: Self-pay | Admitting: Surgery

## 2011-06-01 ENCOUNTER — Ambulatory Visit (INDEPENDENT_AMBULATORY_CARE_PROVIDER_SITE_OTHER): Payer: BC Managed Care – PPO | Admitting: Surgery

## 2011-06-01 VITALS — BP 119/80 | HR 91

## 2011-06-01 DIAGNOSIS — I773 Arterial fibromuscular dysplasia: Secondary | ICD-10-CM

## 2011-06-01 DIAGNOSIS — I7789 Other specified disorders of arteries and arterioles: Secondary | ICD-10-CM

## 2011-06-01 DIAGNOSIS — I729 Aneurysm of unspecified site: Secondary | ICD-10-CM

## 2011-06-01 NOTE — Progress Notes (Signed)
Subjective:     Patient ID: Caroline Boone, female   DOB: [birth year], 45 y.o.   MRN: [id number]  HPI The patient is a 45 year old female that I had seen for evaluation of a carotid pseudoaneurysm. This was first detected in June. At the time of her irregular cardiology appointment a carotid bruit was auscultated. This led to a carotid ultrasound and ultimately a CT scan. Findings included left internal carotid artery with fibromuscular changes and a pseudoaneurysm originating at the level of C1. This was further evaluated with formal angiography by Dr. Earlene Plater for these findings reveal a large pseudoaneurysm within the left internal carotid artery at the C1 occipital region with a fusiform component extending into the cervical Petrus junction she also has fibromuscular changes within the internal carotid artery on both sides. As well as the left vertebral artery. Consideration was made for a pipeline stents repair the patient was referred to Sheldon Silvan at [facility]. After reviewing the angiogram the pipeline step procedure was delayed and the patient was placed on Plavix and aspirin. She is scheduled to have a followup in October. This past week the patient had another I. headache. She's been having headaches for several years now she describes them as being left-sided. They are associated with neck pain. A repeat CT scan was done which did not show any significant change.   Baseline medical problems are hypertension and headaches   Review of Systems  Constitutional: Negative for fever and chills.  HENT: Positive for neck stiffness.   Respiratory: Negative for shortness of breath.   Cardiovascular: Negative for chest pain and palpitations.  Neurological: Positive for headaches. Negative for syncope, weakness and numbness.  All other systems reviewed and are negative.       Past Medical History  Diagnosis Date  . Palpitations     hx  . Hypertension     a. echo 7/11: mod LVH, EF 55-65%   . GERD (gastroesophageal reflux disease)   . Insomnia   . Anxiety   . Headache   . History of cardiac cath 2007    cath 11/25/05 by Dr. Jenne Campus with normal cors and EF 50%  . Chest pain     GXT echo 8/11: normal LVF, no ischemia;  h/o neg. myoview in 2005  . Carotid stenosis     Dopplers 6/12:0-39% bilateral; distal ICAs with marked tortuosity; CT angiogram recommended    History  Substance Use Topics  . Smoking status: Never Smoker   . Smokeless tobacco: Not on file  . Alcohol Use: No    Family History  Problem Relation Age of Onset  . Arthritis      family hx  . Breast cancer      relative ,50  . Diabetes      family hx  . Hypertension      family hx  . Kidney disease      family hx  . Lung cancer      family hx  . Coronary artery disease Mother   . Kidney disease Mother   . Heart disease Mother   . Heart attack Father 62    CABG  . Kidney failure Father   . Hypertension Father   . Heart disease Father     Allergies  Allergen Reactions  . Amoxicillin   . Sulfonamide Derivatives     Current outpatient prescriptions:aspirin 81 MG tablet, Take 81 mg by mouth daily.  , Disp: , Rfl: ;  clopidogrel (PLAVIX) 75 MG  tablet, Take 75 mg by mouth daily.  , Disp: , Rfl: ;  esomeprazole (NEXIUM) 40 MG capsule, Take 1 capsule (40 mg total) by mouth daily before breakfast., Disp: 30 capsule, Rfl: 11;  hydrochlorothiazide 25 MG tablet, Take 25 mg by mouth daily.  , Disp: , Rfl:  HYDROcodone-acetaminophen (VICODIN) 5-500 MG per tablet, Take 1 tablet by mouth every 6 (six) hours as needed for pain., Disp: 60 tablet, Rfl: 5;  lisinopril (PRINIVIL,ZESTRIL) 20 MG tablet, Take 10 mg by mouth daily. Take extra 10 mg at bedtime if BP is up, Disp: , Rfl: ;  LORazepam (ATIVAN) 1 MG tablet, Take 1 tablet (1 mg total) by mouth every 6 (six) hours as needed for anxiety., Disp: 60 tablet, Rfl: 5 promethazine (PHENERGAN) 25 MG tablet, Take 25 mg by mouth 4 (four) times daily as needed.  , Disp:  , Rfl: ;  rizatriptan (MAXALT) 10 MG tablet, Take 1 tablet (10 mg total) by mouth as needed for migraine. May repeat in 2 hours if needed, Disp: 6 tablet, Rfl: 11;  zolpidem (AMBIEN) 10 MG tablet, Take 1 tablet (10 mg total) by mouth at bedtime as needed., Disp: 30 tablet, Rfl: 5 levonorgestrel-ethinyl estradiol (PORTIA-28) 0.15-30 MG-MCG per tablet, Take 1 tablet by mouth daily.  , Disp: , Rfl:   There were no vitals filed for this visit.  There is no height or weight on file to calculate BMI.       Objective:   Physical Exam  Constitutional: She is oriented to person, place, and time. She appears well-developed and well-nourished.  HENT:  Head: Atraumatic.  Cardiovascular: Normal rate.   Pulmonary/Chest: Effort normal.  Neurological: She is alert and oriented to person, place, and time. No cranial nerve deficit. Coordination normal.  Skin: Skin is warm and dry.       Assessment:    bilateral carotid fibromuscular changes. 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 She does have fibromuscular changes within the left internal carotid artery just distal to the bifurcation. There no significant stenoses associated with this area. As we get up into the area near C1 and the occipital junction there is a most likely pseudoaneurysm. I reiterated to the patient that this is above the area that I would feel comfortable managing her. She has seen a neurosurgeon at [facility] who has made a treatment plan with her. She will I. to get a second opinion. I recommend sending her down to the [facility]. I met him a copy of her scans to 4 down there.

## 2011-06-08 ENCOUNTER — Encounter: Payer: BC Managed Care – PPO | Admitting: Surgery

## 2011-06-19 ENCOUNTER — Encounter: Payer: Self-pay | Admitting: Family Medicine

## 2011-06-19 ENCOUNTER — Ambulatory Visit (INDEPENDENT_AMBULATORY_CARE_PROVIDER_SITE_OTHER): Payer: BC Managed Care – PPO | Admitting: Family Medicine

## 2011-06-19 VITALS — BP 108/78 | HR 108 | Temp 98.3°F | Wt 158.0 lb

## 2011-06-19 DIAGNOSIS — F411 Generalized anxiety disorder: Secondary | ICD-10-CM

## 2011-06-19 DIAGNOSIS — F419 Anxiety disorder, unspecified: Secondary | ICD-10-CM

## 2011-06-19 DIAGNOSIS — I720 Aneurysm of carotid artery: Secondary | ICD-10-CM

## 2011-06-19 MED ORDER — LORAZEPAM 1 MG PO TABS: 1.0000 mg | ORAL_TABLET | Freq: Four times a day (QID) | ORAL | Status: DC | PRN

## 2011-06-21 ENCOUNTER — Encounter: Payer: Self-pay | Admitting: Family Medicine

## 2011-06-21 NOTE — Progress Notes (Signed)
  Subjective:    Patient ID: Caroline Boone, female    DOB: [birth year], 45 y.o.   MRN: [id number]  HPI Here asking for advice about aproblem that has come up over the past month or so. She was found to have a bruit over the left carotid, and ended up having an angiogram which revealed fibromuscular dysplasia and a 17 x 18 mm pseudoaneurysm in the left internal carotid artery. She was evaluated by Dr. Coral Else here in [place] and was then referred to Dr. Willy Eddy at [facility] for this. There was thought about doing a bypass procedure for this but Dr. Holley Raring recommended that they wait for now and that she take Plavix and aspirin. She is having frequent left sided HAs and there is a question as to whether this pseudoaneurysm is contrubuting to the HAs. She is actually scheduled to see another neurosurgeon at [facility] next week for another opinion. No other neurologic deficits.    Review of Systems  Constitutional: Negative.   Eyes: Negative.   Neurological: Positive for headaches. Negative for dizziness, tremors, seizures, syncope, facial asymmetry, speech difficulty, weakness, light-headedness and numbness.       Objective:   Physical Exam  Constitutional: She is oriented to person, place, and time. She appears well-developed and well-nourished.  HENT:  Head: Normocephalic and atraumatic.  Right Ear: External ear normal.  Left Ear: External ear normal.  Nose: Nose normal.  Mouth/Throat: Oropharynx is clear and moist.  Eyes: Conjunctivae and EOM are normal. Pupils are equal, round, and reactive to light.  Neck: Neck supple. No thyromegaly present.       Positive left carotid bruit   Cardiovascular: Normal rate, regular rhythm, normal heart sounds and intact distal pulses.  Exam reveals no gallop and no friction rub.   No murmur heard. Lymphadenopathy:    She has no cervical adenopathy.  Neurological: She is alert and oriented to person, place, and time. No cranial nerve deficit.  She exhibits normal muscle tone. Coordination normal.          Assessment & Plan:  This poses an interesting dilemma of weighing the risks and benefits of a risky procedure. I told her to follow the experts' advice and to definitely get a second opinion before undergoing such a procedure. Of course this has been very anxiety provoking to her, and she asks for refills on Lorazepam. These were provided.  Follow up prn

## 2011-07-17 DIAGNOSIS — I773 Arterial fibromuscular dysplasia: Secondary | ICD-10-CM | POA: Insufficient documentation

## 2011-07-17 DIAGNOSIS — I729 Aneurysm of unspecified site: Secondary | ICD-10-CM | POA: Insufficient documentation

## 2011-07-24 ENCOUNTER — Other Ambulatory Visit: Payer: Self-pay | Admitting: Family Medicine

## 2011-07-24 NOTE — Telephone Encounter (Signed)
Script called in

## 2011-07-24 NOTE — Telephone Encounter (Signed)
Call in #60 with 5 rf 

## 2011-08-14 ENCOUNTER — Other Ambulatory Visit (HOSPITAL_COMMUNITY): Payer: Self-pay | Admitting: Nephrology

## 2011-08-14 DIAGNOSIS — I10 Essential (primary) hypertension: Secondary | ICD-10-CM

## 2011-08-19 ENCOUNTER — Other Ambulatory Visit (HOSPITAL_COMMUNITY): Payer: BC Managed Care – PPO

## 2011-08-20 ENCOUNTER — Ambulatory Visit (HOSPITAL_COMMUNITY)
Admission: RE | Admit: 2011-08-20 | Discharge: 2011-08-20 | Disposition: A | Discharge status: Home / Self Care | Payer: BC Managed Care – PPO | Source: Ambulatory Visit | Attending: Nephrology | Admitting: Nephrology

## 2011-08-20 DIAGNOSIS — I7789 Other specified disorders of arteries and arterioles: Secondary | ICD-10-CM | POA: Insufficient documentation

## 2011-08-20 DIAGNOSIS — M545 Low back pain, unspecified: Secondary | ICD-10-CM | POA: Insufficient documentation

## 2011-08-20 DIAGNOSIS — I10 Essential (primary) hypertension: Secondary | ICD-10-CM

## 2011-08-20 MED ORDER — GADOFOSVESET TRISODIUM 244 MG/ML IV SOLN
9.0000 mL | Freq: Once | INTRAVENOUS | Status: AC
  Administered 2011-08-20: 9 mL via INTRAVENOUS

## 2011-09-02 DIAGNOSIS — I701 Atherosclerosis of renal artery: Secondary | ICD-10-CM | POA: Insufficient documentation

## 2011-09-04 ENCOUNTER — Telehealth: Payer: Self-pay | Admitting: Family Medicine

## 2011-09-04 NOTE — Telephone Encounter (Signed)
Pt has elevated heart rate yesterday of 135 heart rate did drop. Today pt took heart rate and it was 104 and her blood pressure was 98/68. Pt wanted to know what she should do she went to [facility] to get a routine test and was advised to see her PCP

## 2011-09-04 NOTE — Telephone Encounter (Signed)
Dr. Claris Che nurse could not reach pt by phone.

## 2011-09-04 NOTE — Telephone Encounter (Signed)
Will discuss with Dr Clent Ridges.

## 2011-09-18 ENCOUNTER — Other Ambulatory Visit: Payer: Self-pay | Admitting: Family Medicine

## 2011-09-22 ENCOUNTER — Telehealth: Payer: Self-pay | Admitting: Family Medicine

## 2011-09-22 NOTE — Telephone Encounter (Signed)
Refill for Zolpidem Tartrate 10 mg take 1 po qhs prn and pt last here on 06/19/11.

## 2011-09-23 NOTE — Telephone Encounter (Signed)
Call in 6 month supply  

## 2011-09-24 MED ORDER — ZOLPIDEM TARTRATE 10 MG PO TABS: 10.0000 mg | ORAL_TABLET | Freq: Every evening | ORAL | Status: DC | PRN

## 2011-09-24 NOTE — Telephone Encounter (Signed)
Script called in

## 2011-09-29 ENCOUNTER — Ambulatory Visit (INDEPENDENT_AMBULATORY_CARE_PROVIDER_SITE_OTHER): Payer: BC Managed Care – PPO | Admitting: Family Medicine

## 2011-09-29 ENCOUNTER — Encounter: Payer: Self-pay | Admitting: Family Medicine

## 2011-09-29 ENCOUNTER — Telehealth: Payer: Self-pay

## 2011-09-29 VITALS — BP 100/80 | HR 120 | Temp 99.5°F | Wt 157.0 lb

## 2011-09-29 DIAGNOSIS — Z20828 Contact with and (suspected) exposure to other viral communicable diseases: Secondary | ICD-10-CM

## 2011-09-29 LAB — POCT INFLUENZA A/B
Influenza A, POC: NEGATIVE
Influenza B, POC: NEGATIVE

## 2011-09-29 MED ORDER — OSELTAMIVIR PHOSPHATE 75 MG PO CAPS: 75.0000 mg | ORAL_CAPSULE | ORAL | Status: AC

## 2011-09-29 MED ORDER — HYDROCODONE-HOMATROPINE 5-1.5 MG/5ML PO SYRP: 120.0000 mL | ORAL_SOLUTION | Freq: Four times a day (QID) | ORAL | Status: DC | PRN

## 2011-09-29 MED ORDER — HYDROCODONE-HOMATROPINE 5-1.5 MG/5ML PO SYRP: 5.0000 mL | ORAL_SOLUTION | Freq: Four times a day (QID) | ORAL | Status: AC | PRN

## 2011-09-29 NOTE — Patient Instructions (Signed)

## 2011-09-29 NOTE — Telephone Encounter (Signed)
Call in Tamiflu 75 mg bid for 5 days  

## 2011-09-29 NOTE — Telephone Encounter (Signed)
OK to give a note. Cough med called in.

## 2011-09-29 NOTE — Telephone Encounter (Signed)
Pt is aware waiting on MD °

## 2011-09-29 NOTE — Telephone Encounter (Signed)
Pt is also requesting Hydromet syrup for her cough and a doctor's note faxed.   Call [phone] for fax number.

## 2011-09-29 NOTE — Progress Notes (Signed)
  Subjective:    Patient ID: Caroline Boone, female    DOB: 11/11/1965, 45 y.o.   MRN: [id number]  HPI 45 year old white female nonsmoker in with complaints of sore throat sinus pressure and pain low-grade fever, and muscle aches that began last night. Patient is concerned because she's had exposure to influenza A. Her daughter tested positive for the flu. She has been taking over-the-counter Mucinex and Tylenol with little relief. Denies any chest pain, shortness of breath, headaches, or abdominal pain.  Review of Systems As stated above    Objective:   Physical Exam   Alert and oriented, no acute distress. HEENT: Ears are clear bilaterally, nasal turbinates are normal, pharynx moderately rate and no exudate, no sinus tenderness to palpation. No lymphadenopathy. Lungs: Clear to auscultation Cardiac: Regular rate and rhythm no murmurs rubs or gallops. Skin warm and dry. No cyanosis  Rapid influenza obtained: Negative     Assessment & Plan:  Assessment: Viral URI  Plan: Drink plenty of fluids. Rest. OTC symptomatic treatment. Call if symptoms worsen or persist. Tamiflu 75 mg one by mouth daily x10 #10 with no refills.

## 2011-09-29 NOTE — Telephone Encounter (Signed)
Ask Caroline Boone since she saw her today

## 2011-09-29 NOTE — Telephone Encounter (Signed)
Pt's daughter tested positive for flu at Urgent Care this past weekend. Pt is starting not to feel well and would like to have Tamiflu called in to [facility] on [address].  Pls advise.

## 2011-10-01 ENCOUNTER — Ambulatory Visit (INDEPENDENT_AMBULATORY_CARE_PROVIDER_SITE_OTHER): Payer: BC Managed Care – PPO | Admitting: Family Medicine

## 2011-10-01 ENCOUNTER — Encounter: Payer: Self-pay | Admitting: Family Medicine

## 2011-10-01 VITALS — BP 90/62 | Temp 97.7°F | Wt 156.0 lb

## 2011-10-01 DIAGNOSIS — J029 Acute pharyngitis, unspecified: Secondary | ICD-10-CM

## 2011-10-01 LAB — POCT RAPID STREP A (OFFICE): Rapid Strep A Screen: NEGATIVE

## 2011-10-01 MED ORDER — AZITHROMYCIN 250 MG PO TABS: ORAL_TABLET | ORAL | Status: AC

## 2011-10-01 NOTE — Progress Notes (Signed)
  Subjective:    Patient ID: Johnney Killian, female    DOB: [birth year], 45 y.o.   MRN: [id number]  HPI  Acute visit. Patient had acute onset couple days ago fever and sore throat. She's had some cough. Negative influenza screen 2 days ago but daughter had strep last week. Minimal sore throat. Using Hycodan cough syrup for cough suppression. Occasional headache. No stiff neck. Patient denies any nausea or vomiting. No diarrhea. No skin rash. She's had some sinus pressure maxillary sinus region.   Review of Systems As per history of present illness    Objective:   Physical Exam  Constitutional: She appears well-developed and well-nourished. No distress.  HENT:  Right Ear: External ear normal.  Left Ear: External ear normal.       Mild posterior pharynx erythema. No exudate  Neck: Neck supple.  Cardiovascular: Normal rate and regular rhythm.   Pulmonary/Chest: Effort normal and breath sounds normal. No respiratory distress. She has no wheezes. She has no rales.  Lymphadenopathy:    She has no cervical adenopathy.  Skin: No rash noted.          Assessment & Plan:  Probable viral syndrome. Rapid strep negative. Finish out Tamiflu. No antibiotics recommended at this time. Patient requests written prescription for antibiotic.   If symptoms worsen or second wave of illness start antibiotic.

## 2011-11-04 ENCOUNTER — Ambulatory Visit (INDEPENDENT_AMBULATORY_CARE_PROVIDER_SITE_OTHER): Payer: BC Managed Care – PPO | Admitting: Family Medicine

## 2011-11-04 ENCOUNTER — Encounter: Payer: Self-pay | Admitting: Family Medicine

## 2011-11-04 VITALS — BP 124/90 | HR 105 | Temp 98.1°F | Wt 158.0 lb

## 2011-11-04 DIAGNOSIS — F411 Generalized anxiety disorder: Secondary | ICD-10-CM

## 2011-11-04 DIAGNOSIS — M545 Low back pain, unspecified: Secondary | ICD-10-CM

## 2011-11-04 DIAGNOSIS — I7789 Other specified disorders of arteries and arterioles: Secondary | ICD-10-CM

## 2011-11-04 DIAGNOSIS — F419 Anxiety disorder, unspecified: Secondary | ICD-10-CM

## 2011-11-04 DIAGNOSIS — I773 Arterial fibromuscular dysplasia: Secondary | ICD-10-CM | POA: Insufficient documentation

## 2011-11-04 MED ORDER — ALPRAZOLAM 1 MG PO TABS: 1.0000 mg | ORAL_TABLET | Freq: Three times a day (TID) | ORAL | Status: AC | PRN

## 2011-11-04 MED ORDER — CYCLOBENZAPRINE HCL 10 MG PO TABS: 10.0000 mg | ORAL_TABLET | Freq: Three times a day (TID) | ORAL | Status: AC | PRN

## 2011-11-04 NOTE — Progress Notes (Signed)
  Subjective:    Patient ID: Caroline Boone, female    DOB: [birth year], 46 y.o.   MRN: [id number]  HPI Here for 2 reasons. First she tripped and fell while walking her dog 2 months ago, and she has had intermittent pain in the left lower back since then. The back gets stiff and she gets a dull pain in the area. No radiation down the leg. No numbness or weakness in the legs. Using heat and Vicodin with mixed results. Also her anxiety has been worse lately, and her Ativan is not helping very much. She asks if we can switch to Xanax.    Review of Systems  Constitutional: Negative.   Respiratory: Negative.   Cardiovascular: Negative.   Musculoskeletal: Positive for back pain.  Psychiatric/Behavioral: Negative for confusion, dysphoric mood, decreased concentration and agitation. The patient is nervous/anxious.        Objective:   Physical Exam  Constitutional:       Walks and gets on the exam table easily   Musculoskeletal:       Mildly tender in the left lower back with full ROM and negative SLRs   Psychiatric: She has a normal mood and affect. Her behavior is normal. Thought content normal.          Assessment & Plan:  Switch from Ativan to Xanax. She will continue heat and Vicodin, but we will add Flexeril for muscle spasms in the back. If she is not feeling better in a week or so, we may send her for PT

## 2011-11-12 ENCOUNTER — Ambulatory Visit (INDEPENDENT_AMBULATORY_CARE_PROVIDER_SITE_OTHER): Payer: BC Managed Care – PPO | Admitting: Physician Assistant

## 2011-11-12 ENCOUNTER — Encounter: Payer: Self-pay | Admitting: Physician Assistant

## 2011-11-12 DIAGNOSIS — I773 Arterial fibromuscular dysplasia: Secondary | ICD-10-CM

## 2011-11-12 DIAGNOSIS — R002 Palpitations: Secondary | ICD-10-CM

## 2011-11-12 DIAGNOSIS — I10 Essential (primary) hypertension: Secondary | ICD-10-CM

## 2011-11-12 DIAGNOSIS — I7789 Other specified disorders of arteries and arterioles: Secondary | ICD-10-CM

## 2011-11-12 LAB — BASIC METABOLIC PANEL
Chloride: 105 mEq/L (ref 96–112)
GFR: 128.28 mL/min (ref >60.00)
Potassium: 3.8 mEq/L (ref 3.5–5.1)
Sodium: 141 mEq/L (ref 135–145)

## 2011-11-12 LAB — CBC WITH DIFFERENTIAL/PLATELET
Basophils Relative: 0.7 % (ref 0.0–3.0)
Eosinophils Relative: 1.3 % (ref 0.0–5.0)
HCT: 40.9 % (ref 36.0–46.0)
Lymphs Abs: 2.1 10*3/uL (ref 0.7–4.0)
MCV: 96.1 fl (ref 78.0–100.0)
Monocytes Absolute: 0.5 10*3/uL (ref 0.1–1.0)
Monocytes Relative: 7.6 % (ref 3.0–12.0)
RBC: 4.25 Mil/uL (ref 3.87–5.11)
WBC: 6.9 10*3/uL (ref 4.5–10.5)

## 2011-11-12 MED ORDER — HYDROCHLOROTHIAZIDE 25 MG PO TABS: 25.0000 mg | ORAL_TABLET | Freq: Every day | ORAL | Status: DC

## 2011-11-12 MED ORDER — METOPROLOL SUCCINATE ER 25 MG PO TB24: 25.0000 mg | ORAL_TABLET | Freq: Every day | ORAL | Status: DC

## 2011-11-12 MED ORDER — NORTRIPTYLINE HCL 75 MG PO CAPS: 75.0000 mg | ORAL_CAPSULE | Freq: Every day | ORAL | Status: DC

## 2011-11-12 NOTE — Assessment & Plan Note (Signed)
Followed closely at [facility].  She remains on dual antiplatelet therapy.

## 2011-11-12 NOTE — Patient Instructions (Signed)
Your physician has recommended you make the following change in your medication: START TOPROL XL 25 MG 1 TABLET DAILY  Your physician recommends that you return for lab work in: TODAY BMET, CBC W/DIFF, TSH 785.1, 401.1  Your physician has recommended that you wear an event monitor DX 785.1 PALPITATIONS. Event monitors are medical devices that record the heart's electrical activity. Doctors most often [place] these monitors to diagnose arrhythmias. Arrhythmias are problems with the speed or rhythm of the heartbeat. The monitor is a small, portable device. You can wear one while you do your normal daily activities. This is usually used to diagnose what is causing palpitations/syncope (passing out).  Your physician recommends that you schedule a follow-up appointment in: 6 WEEKS WITH DR. HOCHREIN PER SCOTT WEAVER, PA-C

## 2011-11-12 NOTE — Assessment & Plan Note (Signed)
Add Toprol as noted.  She works at a [employer] and will monitor her blood pressures and notify us of any issues.

## 2011-11-12 NOTE — Progress Notes (Signed)
[address] [place], [place]  [address] Phone: [phone] Fax:  [phone]  Date:  11/12/2011   Name:  Caroline Boone       DOB:  04-12-1966 MRN:  [id number]  PCP:  Dr. Clent Ridges Primary Cardiologist:  Dr. Rollene Rotunda  Primary Electrophysiologist:  None    History of Present Illness: Caroline Boone is a 46 y.o. female who presents for rapid heart rate.  She has a h/o HTN and palpitations.  She has a history of normal cardiac catheterization in, and GERD, anxiety.  Stress echo 8/11: Normal LV function, no ischemia.  Prior Myoview in 2005 also negative.  Echocardiogram 6/12: EF 55-60%, PASP 29.  The patient had a carotid bruit on exam when last seen by me in 6/12.  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  She also had an MRA in 10/12 demonstrated fibromuscular dysplasia of the renal arteries.  She was apparently referred to interventional radiology.  She was last seen by Dr. Antoine Poche in 7/12.  In followup, she was being followed at [facility].  It was felt that surgery for her carotid pseudoaneurysm was too risky.  He was placed on dual antiplatelet therapy.  She eventually transferred her care to Dr. Dannielle Huh at [facility].  She gets serial scans.  She has been placed on TCA's to prevent headaches.  Amitriptyline was recently changed to nortriptyline.  She fell earlier this month.  A couple of days later, she had some confusion and was admitted to [facility].  There was some concern that she was having a stroke but apparently her workup was negative.  During her time in the hospital, her heart rate was noted to be elevated.  She reports her heart rates were between 114-125.  Her blood pressure was also elevated.  She has been told by her neurologist and neurosurgeon at she should try to keep her blood pressure low and her heart rate down.  She has several activities from which she is  restricted.  She denies chest pain, dyspnea, syncope, orthopnea, PND or edema.  She does have rapid palpitations.  She denies any significant symptoms with this.  Past Medical History  Diagnosis Date  . Palpitations     hx  . Hypertension     a. echo 7/11: mod LVH, EF 55-65%  . GERD (gastroesophageal reflux disease)   . Insomnia   . Anxiety   . Headache   . History of cardiac cath 2007    cath 11/25/05 by Dr. Jenne Campus with normal cors and EF 50%  . Chest pain     GXT echo 8/11: normal LVF, no ischemia;  h/o neg. myoview in 2005  . Carotid stenosis     Dopplers 6/12:0-39% bilateral; distal ICAs with marked tortuosity; CT angiogram recommended  . 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 Prescott Parma  at [facility]  . Fibromuscular dysplasia     affects left vertebral,  artery, left ICA, left renal artery    Current Outpatient Prescriptions  Medication Sig Dispense Refill  . ALPRAZolam (XANAX) 1 MG tablet Take 1 tablet (1 mg total) by mouth 3 (three) times daily as needed for anxiety.  90 tablet  2  . aspirin 81 MG tablet Take 81 mg by mouth daily.        . clopidogrel (PLAVIX) 75 MG  tablet Take 75 mg by mouth daily.        . cyclobenzaprine (FLEXERIL) 10 MG tablet Take 1 tablet (10 mg total) by mouth 3 (three) times daily as needed for muscle spasms.  90 tablet  5  . esomeprazole (NEXIUM) 40 MG capsule Take 1 capsule (40 mg total) by mouth daily before breakfast.  30 capsule  11  . hydrochlorothiazide 25 MG tablet Take 25 mg by mouth daily.        Marland Kitchen HYDROcodone-acetaminophen (VICODIN) 5-500 MG per tablet take 1 tablet by mouth every 6 hours if needed for pain  60 tablet  5  . lisinopril (PRINIVIL,ZESTRIL) 20 MG tablet Take 10 mg by mouth daily. Take extra 10 mg at bedtime if BP is up      . promethazine (PHENERGAN) 25 MG tablet Take 25 mg by mouth 4 (four) times daily as needed.        . rizatriptan (MAXALT) 10 MG tablet Take 1 tablet  (10 mg total) by mouth as needed for migraine. May repeat in 2 hours if needed  6 tablet  11  . zolpidem (AMBIEN) 10 MG tablet Take 1 tablet (10 mg total) by mouth at bedtime as needed.  30 tablet  5    Allergies: Allergies  Allergen Reactions  . Amoxicillin   . Sulfonamide Derivatives     History  Substance Use Topics  . Smoking status: Never Smoker   . Smokeless tobacco: Never Used  . Alcohol Use: No     ROS:  Please see the history of present illness.   She notes constipation from the nortriptyline.  All other systems reviewed and negative.   PHYSICAL EXAM: VS:  BP 117/78  Pulse 93  Ht 5\' 4"  (1.626 m)  Wt 157 lb (71.215 kg)  BMI 26.95 kg/m2 Well nourished, well developed, in no acute distress HEENT: normal Neck: no JVD Cardiac:  normal S1, S2; RRR; no murmur Lungs:  clear to auscultation bilaterally, no wheezing, rhonchi or rales Abd: soft, nontender, no hepatomegaly Ext: no edema Skin: warm and dry Neuro:  CNs 2-12 intact, no focal abnormalities noted  EKG:   Sinus rhythm, heart rate 89, normal axis, no ischemic changes, prolonged QTc 493 ms  ASSESSMENT AND PLAN:

## 2011-11-12 NOTE — Assessment & Plan Note (Addendum)
Her heart rate actually looks fairly good today.  However, given her fibromuscular dysplasia and left internal carotid artery pseudoaneurysm and the need to keep her blood pressure and heart rate down, I have elected to place her on Toprol-XL 25 mg daily.  Check a basic metabolic panel, CBC, TSH.  I will also set her up for an event monitor to ensure that:  A.  Her heart rate is well controlled and B.  To rule out any significant arrhythmias.  As noted, she had a recent echo that was normal.  I will try to obtain her records from [facility].  Followup with Dr. Rollene Rotunda in 6 weeks.

## 2011-11-17 ENCOUNTER — Encounter (INDEPENDENT_AMBULATORY_CARE_PROVIDER_SITE_OTHER): Payer: BC Managed Care – PPO

## 2011-11-17 DIAGNOSIS — R002 Palpitations: Secondary | ICD-10-CM

## 2011-12-08 ENCOUNTER — Telehealth: Payer: Self-pay | Admitting: Cardiology

## 2011-12-08 NOTE — Telephone Encounter (Signed)
Pt calling to find out when she is to send monitor back in? pls call

## 2011-12-11 ENCOUNTER — Other Ambulatory Visit: Payer: Self-pay | Admitting: Obstetrics

## 2011-12-11 DIAGNOSIS — Z1231 Encounter for screening mammogram for malignant neoplasm of breast: Secondary | ICD-10-CM

## 2011-12-14 ENCOUNTER — Ambulatory Visit
Admission: RE | Admit: 2011-12-14 | Discharge: 2011-12-14 | Disposition: A | Discharge status: Home / Self Care | Payer: BC Managed Care – PPO | Source: Ambulatory Visit | Attending: Obstetrics | Admitting: Obstetrics

## 2011-12-14 ENCOUNTER — Other Ambulatory Visit: Payer: Self-pay | Admitting: Obstetrics

## 2011-12-14 DIAGNOSIS — Z1231 Encounter for screening mammogram for malignant neoplasm of breast: Secondary | ICD-10-CM

## 2011-12-21 ENCOUNTER — Ambulatory Visit (INDEPENDENT_AMBULATORY_CARE_PROVIDER_SITE_OTHER): Payer: BC Managed Care – PPO | Admitting: Cardiology

## 2011-12-21 ENCOUNTER — Encounter: Payer: Self-pay | Admitting: Cardiology

## 2011-12-21 VITALS — BP 125/80 | HR 74 | Ht 64.0 in | Wt 157.0 lb

## 2011-12-21 DIAGNOSIS — R002 Palpitations: Secondary | ICD-10-CM

## 2011-12-21 DIAGNOSIS — I10 Essential (primary) hypertension: Secondary | ICD-10-CM

## 2011-12-21 DIAGNOSIS — I7789 Other specified disorders of arteries and arterioles: Secondary | ICD-10-CM

## 2011-12-21 DIAGNOSIS — I773 Arterial fibromuscular dysplasia: Secondary | ICD-10-CM

## 2011-12-21 MED ORDER — METOPROLOL TARTRATE 25 MG PO TABS: 25.0000 mg | ORAL_TABLET | ORAL | Status: DC | PRN

## 2011-12-21 NOTE — Patient Instructions (Signed)
You may take Metoprolol 25 mg as needed for palpitations and increased heart rate. Continue all other medications as listed  Follow up with Dr Antoine Poche in 6 months

## 2011-12-21 NOTE — Progress Notes (Signed)
HPI The patient resents for follow up of rapid heart rate.  She has a history is well outlined in previous notes of a left internal carotid artery fibromuscular dysplasia with pseudoaneurysm and also fibromuscular dysplasia of the renal arteries. She was being considered for stent repair of this but it was thought to be high risk and she is now being managed medically. Most recently she had palpitations. She wore an event monitor which I reviewed with her today. There were no significant dysrhythmias or sustained tachycardia arrhythmias. She has been started on a beta blocker for blood pressure and heart rate control. She says she rarely feels her heart rate increasing and if it does it goes down quickly. She's had no presyncope or syncope. She's had no chest pressure, neck or arm discomfort.   Allergies  Allergen Reactions  . Amoxicillin   . Sulfonamide Derivatives     Current Outpatient Prescriptions  Medication Sig Dispense Refill  . aspirin 81 MG tablet Take 81 mg by mouth daily.        . clopidogrel (PLAVIX) 75 MG tablet Take 75 mg by mouth daily.        Marland Kitchen esomeprazole (NEXIUM) 40 MG capsule Take 1 capsule (40 mg total) by mouth daily before breakfast.  30 capsule  11  . hydrochlorothiazide (HYDRODIURIL) 25 MG tablet Take 1 tablet (25 mg total) by mouth daily.  30 tablet  11  . HYDROcodone-acetaminophen (VICODIN) 5-500 MG per tablet take 1 tablet by mouth every 6 hours if needed for pain  60 tablet  5  . lisinopril (PRINIVIL,ZESTRIL) 20 MG tablet Take 10 mg by mouth daily. Take extra 10 mg at bedtime if BP is up      . metoprolol succinate (TOPROL XL) 25 MG 24 hr tablet Take 1 tablet (25 mg total) by mouth daily.  30 tablet  11  . nortriptyline (PAMELOR) 75 MG capsule Take 1 capsule (75 mg total) by mouth at bedtime.  30 capsule  11  . promethazine (PHENERGAN) 25 MG tablet Take 25 mg by mouth 4 (four) times daily as needed.        . rizatriptan (MAXALT) 10 MG tablet Take 1 tablet (10 mg  total) by mouth as needed for migraine. May repeat in 2 hours if needed  6 tablet  11  . zolpidem (AMBIEN) 10 MG tablet Take 1 tablet (10 mg total) by mouth at bedtime as needed.  30 tablet  5    Past Medical History  Diagnosis Date  . Palpitations     hx  . Hypertension     a. echo 7/11: mod LVH, EF 55-65%  . GERD (gastroesophageal reflux disease)   . Insomnia   . Anxiety   . Headache   . History of cardiac cath 2007    cath 11/25/05 by Dr. Jenne Campus with normal cors and EF 50%  . Chest pain     GXT echo 8/11: normal LVF, no ischemia;  h/o neg. myoview in 2005  . Carotid stenosis     Dopplers 6/12:0-39% bilateral; distal ICAs with marked tortuosity; CT angiogram recommended  . 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 Prescott Parma  at [facility]  . Fibromuscular dysplasia     affects left vertebral,  artery, left ICA, left renal artery;  head and neck CTA 10/24/11: stable pattern of fibromuscular dysplasia of ICA and VA with distal changes suggesting prior pseudoaneurysm and dissection; L  dist ICA 60%; normal MRI 10/29/11    Past Surgical History  Procedure Date  . Breast enhancement surgery 1991    Bilateral breast     ROS:  As stated in the HPI and negative for all other systems.  PHYSICAL EXAM BP 125/80  Pulse 74  Ht 5\' 4"  (1.626 m)  Wt 157 lb (71.215 kg)  BMI 26.95 kg/m2 GENERAL:  Well appearing HEENT:  Pupils equal round and reactive, fundi not visualized, oral mucosa unremarkable NECK:  No jugular venous distention, waveform within normal limits, carotid upstroke brisk and symmetric, loud left carotid bruit, no thyromegaly LYMPHATICS:  No cervical, inguinal adenopathy LUNGS:  Clear to auscultation bilaterally BACK:  No CVA tenderness CHEST:  Unremarkable HEART:  PMI not displaced or sustained,S1 and S2 within normal limits, no S3, no S4, no clicks, no rubs, no murmurs ABD:  Flat, positive bowel sounds normal in  frequency in pitch, no bruits, no rebound, no guarding, no midline pulsatile mass, no hepatomegaly, no splenomegaly EXT:  2 plus pulses throughout, no edema, no cyanosis no clubbing   ASSESSMENT AND PLAN

## 2011-12-21 NOTE — Assessment & Plan Note (Signed)
Her blood pressure is well controlled. She will continue the meds as listed. 

## 2011-12-21 NOTE — Assessment & Plan Note (Signed)
I will give her a low dose of short-acting metoprolol take as needed if she is having any sustained tachypalpitations. If she has to use this frequently she will let me know and I will further adjust her maintenance dose of beta blocker. No other workup is suggested.

## 2011-12-21 NOTE — Assessment & Plan Note (Signed)
This is now being followed closely at [facility].

## 2012-02-18 ENCOUNTER — Other Ambulatory Visit: Payer: Self-pay | Admitting: Family Medicine

## 2012-02-19 ENCOUNTER — Telehealth: Payer: Self-pay | Admitting: Family Medicine

## 2012-02-19 MED ORDER — HYDROCODONE-ACETAMINOPHEN 5-500 MG PO TABS: 1.0000 | ORAL_TABLET | Freq: Four times a day (QID) | ORAL | Status: DC | PRN

## 2012-02-19 NOTE — Telephone Encounter (Signed)
Call in #60 with 5 rf 

## 2012-02-19 NOTE — Telephone Encounter (Signed)
Refill request for Vicodin 5-500 mg take 1 po q6hrs prn and pt last here on 11/04/11.

## 2012-02-19 NOTE — Telephone Encounter (Signed)
I called in script 

## 2012-03-08 ENCOUNTER — Other Ambulatory Visit: Payer: Self-pay | Admitting: Family Medicine

## 2012-03-09 NOTE — Telephone Encounter (Signed)
Call in #[phone] with 5 rf 

## 2012-03-18 ENCOUNTER — Telehealth: Payer: Self-pay | Admitting: Family Medicine

## 2012-03-18 MED ORDER — ALPRAZOLAM 1 MG PO TABS: 1.0000 mg | ORAL_TABLET | Freq: Three times a day (TID) | ORAL | Status: AC | PRN

## 2012-03-18 NOTE — Telephone Encounter (Signed)
I called in script 

## 2012-03-18 NOTE — Telephone Encounter (Signed)
Call in #[phone] with 5 rf 

## 2012-03-18 NOTE — Telephone Encounter (Signed)
Refill request for Alprazolam 1 mg take po tid prn and last here on 11/04/11.

## 2012-03-20 ENCOUNTER — Emergency Department (HOSPITAL_BASED_OUTPATIENT_CLINIC_OR_DEPARTMENT_OTHER)
Admission: EM | Admit: 2012-03-20 | Discharge: 2012-03-20 | Disposition: A | Discharge status: Home / Self Care | Payer: BC Managed Care – PPO | Attending: Emergency Medicine | Admitting: Emergency Medicine

## 2012-03-20 ENCOUNTER — Encounter (HOSPITAL_BASED_OUTPATIENT_CLINIC_OR_DEPARTMENT_OTHER): Payer: Self-pay | Admitting: Emergency Medicine

## 2012-03-20 ENCOUNTER — Emergency Department (HOSPITAL_BASED_OUTPATIENT_CLINIC_OR_DEPARTMENT_OTHER): Payer: BC Managed Care – PPO

## 2012-03-20 DIAGNOSIS — G4700 Insomnia, unspecified: Secondary | ICD-10-CM | POA: Insufficient documentation

## 2012-03-20 DIAGNOSIS — R05 Cough: Secondary | ICD-10-CM | POA: Insufficient documentation

## 2012-03-20 DIAGNOSIS — R059 Cough, unspecified: Secondary | ICD-10-CM | POA: Insufficient documentation

## 2012-03-20 DIAGNOSIS — I10 Essential (primary) hypertension: Secondary | ICD-10-CM | POA: Insufficient documentation

## 2012-03-20 DIAGNOSIS — R0789 Other chest pain: Secondary | ICD-10-CM

## 2012-03-20 DIAGNOSIS — K219 Gastro-esophageal reflux disease without esophagitis: Secondary | ICD-10-CM | POA: Insufficient documentation

## 2012-03-20 HISTORY — DX: Unspecified injury of unspecified carotid artery, initial encounter: S15.009A

## 2012-03-20 HISTORY — DX: Cerebral aneurysm, nonruptured: I67.1

## 2012-03-20 MED ORDER — PREDNISONE 10 MG PO TABS: 20.0000 mg | ORAL_TABLET | Freq: Every day | ORAL | Status: DC

## 2012-03-20 MED ORDER — OXYCODONE-ACETAMINOPHEN 5-325 MG PO TABS: 2.0000 | ORAL_TABLET | ORAL | Status: AC | PRN

## 2012-03-20 NOTE — ED Notes (Signed)
Pt states she has been treated recently for ear infection, URI approx 2 weeks ago.  Pt states she feels like the cold is in her chest.  Pt also states she fell one week ago, hit her nose and her symptoms have worsened.

## 2012-03-20 NOTE — Discharge Instructions (Signed)
Chest Wall Pain Chest wall pain is pain in or around the bones and muscles of your chest. It may take up to 6 weeks to get better. It may take longer if you must stay physically active in your work and activities.  CAUSES  Chest wall pain may happen on its own. However, it may be caused by:  A viral illness like the flu.   Injury.   Coughing.   Exercise.   Arthritis.   Fibromyalgia.   Shingles.  HOME CARE INSTRUCTIONS   Avoid overtiring physical activity. Try not to strain or perform activities that cause pain. This includes any activities using your chest or your abdominal and side muscles, especially if heavy weights are used.   Put ice on the sore area.   Put ice in a plastic bag.   Place a towel between your skin and the bag.   Leave the ice on for 15 to 20 minutes per hour while awake for the first 2 days.   Only take over-the-counter or prescription medicines for pain, discomfort, or fever as directed by your caregiver.  SEEK IMMEDIATE MEDICAL CARE IF:   Your pain increases, or you are very uncomfortable.   You have a fever.   Your chest pain becomes worse.   You have new, unexplained symptoms.   You have nausea or vomiting.   You feel sweaty or lightheaded.   You have a cough with phlegm (sputum), or you cough up blood.  MAKE SURE YOU:   Understand these instructions.   Will watch your condition.   Will get help right away if you are not doing well or get worse.  Document Released: 10/05/2005 Document Revised: 09/24/2011 Document Reviewed: 06/01/2011 [employer] Patient Information 2012 [employer]. 

## 2012-03-20 NOTE — ED Provider Notes (Signed)
Medical screening examination/treatment/procedure(s) were performed by non-physician practitioner and as supervising physician I was immediately available for consultation/collaboration.   Hurman Horn, MD 03/20/12 2228

## 2012-03-20 NOTE — ED Provider Notes (Signed)
History     CSN: [id number]  Arrival date & time 03/20/12  1219   None     Chief Complaint  Patient presents with  . Cough  . Pleurisy  . Fall    (Consider location/radiation/quality/duration/timing/severity/associated sxs/prior treatment) Patient is a 46 y.o. female presenting with cough. The history is provided by the patient. No language interpreter was used.  Cough This is a new problem. Episode onset: 2 weeks. The problem occurs constantly. The problem has been gradually worsening. The cough is productive of sputum. There has been no fever. Associated symptoms include chest pain. Pertinent negatives include no shortness of breath. She has tried nothing for the symptoms. The treatment provided no relief. She is not a smoker. Her past medical history does not include pneumonia.  Pt complains of pain in her chest from coughing.   Pt reports she had a sore throat a week ago and then an ear infection.   Pt reports she hurts all around chest  Past Medical History  Diagnosis Date  . Palpitations     hx  . Hypertension     a. echo 7/11: mod LVH, EF 55-65%  . GERD (gastroesophageal reflux disease)   . Insomnia   . Anxiety   . Headache   . History of cardiac cath 2007    cath 11/25/05 by Dr. Jenne Campus with normal cors and EF 50%  . Chest pain     GXT echo 8/11: normal LVF, no ischemia;  h/o neg. myoview in 2005  . Carotid stenosis     Dopplers 6/12:0-39% bilateral; distal ICAs with marked tortuosity; CT angiogram recommended  . 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 Prescott Parma  at [facility]  . Fibromuscular dysplasia     affects left vertebral,  artery, left ICA, left renal artery;  head and neck CTA 10/24/11: stable pattern of fibromuscular dysplasia of ICA and VA with distal changes suggesting prior pseudoaneurysm and dissection; L dist ICA 60%; normal MRI 10/29/11  . Cerebral aneurysm   . Carotid artery injury     Past  Surgical History  Procedure Date  . Breast enhancement surgery 1991    Bilateral breast     Family History  Problem Relation Age of Onset  . Arthritis      family hx  . Breast cancer      relative ,50  . Diabetes      family hx  . Hypertension      family hx  . Kidney disease      family hx  . Lung cancer      family hx  . Coronary artery disease Mother   . Kidney disease Mother   . Heart disease Mother   . Heart attack Father 94    CABG  . Kidney failure Father   . Hypertension Father   . Heart disease Father     History  Substance Use Topics  . Smoking status: Never Smoker   . Smokeless tobacco: Never Used  . Alcohol Use: No    OB History    Grav Para Term Preterm Abortions TAB SAB Ect Mult Living                  Review of Systems  Respiratory: Positive for cough. Negative for shortness of breath.   Cardiovascular: Positive for chest pain.  All other systems reviewed and are negative.    Allergies  Amoxicillin and  Sulfonamide derivatives  Home Medications   Current Outpatient Rx  Name Route Sig Dispense Refill  . ALPRAZOLAM 1 MG PO TABS Oral Take 1 tablet (1 mg total) by mouth 3 (three) times daily as needed for anxiety. 90 tablet 5  . ASPIRIN 81 MG PO TABS Oral Take 81 mg by mouth daily.      Marland Kitchen CLOPIDOGREL BISULFATE 75 MG PO TABS Oral Take 75 mg by mouth daily.      Marland Kitchen ESOMEPRAZOLE MAGNESIUM 40 MG PO CPDR Oral Take 1 capsule (40 mg total) by mouth daily before breakfast. 30 capsule 11  . HYDROCHLOROTHIAZIDE 25 MG PO TABS Oral Take 1 tablet (25 mg total) by mouth daily. 30 tablet 11  . HYDROCODONE-ACETAMINOPHEN 5-500 MG PO TABS Oral Take 1 tablet by mouth every 6 (six) hours as needed for pain. 60 tablet 5  . LISINOPRIL 20 MG PO TABS Oral Take 10 mg by mouth daily. Take extra 10 mg at bedtime if BP is up    . METOPROLOL SUCCINATE ER 25 MG PO TB24 Oral Take 1 tablet (25 mg total) by mouth daily. 30 tablet 11  . METOPROLOL TARTRATE 25 MG PO TABS Oral  Take 1 tablet (25 mg total) by mouth as needed. 60 tablet 6  . PROMETHAZINE HCL 25 MG PO TABS Oral Take 25 mg by mouth 4 (four) times daily as needed.      Marland Kitchen RIZATRIPTAN BENZOATE 10 MG PO TABS Oral Take 1 tablet (10 mg total) by mouth as needed for migraine. May repeat in 2 hours if needed 6 tablet 11  . ZOLPIDEM TARTRATE 10 MG PO TABS  take 1 tablet by mouth at bedtime if needed 30 tablet 5    BP 99/73  Pulse 76  Temp(Src) 97.3 F (36.3 C) (Oral)  Resp 16  Ht 5\' 4"  (1.626 m)  Wt 149 lb (67.586 kg)  BMI 25.58 kg/m2  SpO2 96%  Physical Exam  Nursing note and vitals reviewed. Constitutional: She is oriented to person, place, and time. She appears well-developed and well-nourished.  HENT:  Head: Normocephalic and atraumatic.  Right Ear: External ear normal.  Left Ear: External ear normal.  Nose: Nose normal.  Mouth/Throat: Oropharynx is clear and moist.  Eyes: Conjunctivae and EOM are normal. Pupils are equal, round, and reactive to light.  Neck: Normal range of motion. Neck supple.  Cardiovascular: Normal rate and regular rhythm.   Pulmonary/Chest: Effort normal and breath sounds normal.  Abdominal: Soft. Bowel sounds are normal.  Musculoskeletal: Normal range of motion.  Neurological: She is alert and oriented to person, place, and time. She has normal reflexes.  Skin: Skin is warm.  Psychiatric: She has a normal mood and affect.    ED Course  Procedures (including critical care time)  Labs Reviewed - No data to display No results found.   No diagnosis found.  Results for orders placed in visit on 11/12/11  BASIC METABOLIC PANEL      Component Value Range   Sodium 141  135 - 145 (mEq/L)   Potassium 3.8  3.5 - 5.1 (mEq/L)   Chloride 105  96 - 112 (mEq/L)   CO2 27  19 - 32 (mEq/L)   Glucose, Bld 88  70 - 99 (mg/dL)   BUN 9  6 - 23 (mg/dL)   Creatinine, Ser 0.5  0.4 - 1.2 (mg/dL)   Calcium 9.1  8.4 - 56.2 (mg/dL)   GFR 130.86  >57.84 (mL/min)  CBC WITH  DIFFERENTIAL  Component Value Range   WBC 6.9  4.5 - 10.5 (K/uL)   RBC 4.25  3.87 - 5.11 (Mil/uL)   Hemoglobin 13.8  12.0 - 15.0 (g/dL)   HCT 21.3  08.6 - 57.8 (%)   MCV 96.1  78.0 - 100.0 (fl)   MCHC 33.9  30.0 - 36.0 (g/dL)   RDW 46.9  62.9 - 52.8 (%)   Platelets 309.0  150.0 - 400.0 (K/uL)   Neutrophils Relative 59.5  43.0 - 77.0 (%)   Lymphocytes Relative 30.9  12.0 - 46.0 (%)   Monocytes Relative 7.6  3.0 - 12.0 (%)   Eosinophils Relative 1.3  0.0 - 5.0 (%)   Basophils Relative 0.7  0.0 - 3.0 (%)   Neutro Abs 4.1  1.4 - 7.7 (K/uL)   Lymphs Abs 2.1  0.7 - 4.0 (K/uL)   Monocytes Absolute 0.5  0.1 - 1.0 (K/uL)   Eosinophils Absolute 0.1  0.0 - 0.7 (K/uL)   Basophils Absolute 0.0  0.0 - 0.1 (K/uL)  TSH      Component Value Range   TSH 0.55  0.35 - 5.50 (uIU/mL)   Dg Chest 2 View  03/20/2012  *RADIOLOGY REPORT*  Clinical Data: Cough and bilateral chest pain.  CHEST - 2 VIEW  Comparison: Two-view chest 04/27/2011.  Findings: The heart size is normal.  The lungs are clear.  Mild curvature of the thoracolumbar spine is stable.  IMPRESSION: No acute cardiopulmonary disease or significant interval change.  Original Report Authenticated By: Jamesetta Orleans. MATTERN, M.D.     MDM  Pt given percocet and prednisone.   Pt advised to follow up with her MD for recheck        Lonia Skinner [place], [place] 03/20/12 1529

## 2012-03-24 ENCOUNTER — Encounter: Payer: Self-pay | Admitting: Family Medicine

## 2012-03-24 ENCOUNTER — Ambulatory Visit (INDEPENDENT_AMBULATORY_CARE_PROVIDER_SITE_OTHER): Payer: BC Managed Care – PPO | Admitting: Family Medicine

## 2012-03-24 VITALS — BP 122/90 | HR 88 | Temp 98.6°F | Wt 151.0 lb

## 2012-03-24 DIAGNOSIS — M940 Chondrocostal junction syndrome [Tietze]: Secondary | ICD-10-CM

## 2012-03-24 MED ORDER — PREDNISONE 10 MG PO TABS: ORAL_TABLET | ORAL | Status: DC

## 2012-03-24 NOTE — Progress Notes (Signed)
  Subjective:    Patient ID: Caroline Boone, female    DOB: 06-May-1966, 46 y.o.   MRN: [id number]  HPI Here to follow up an ER visit on 03-20-12 for chest wall pain. She had been dealing with a URI for 2 weeks prior to that with a dry cough. No fever. Then she developed constant sharp pains in the left anterior chest which are worse if she coughs or takes a deep breath. The cough is still non-productive and it is getting much better. She had a normal CXR in the ER. She was given a 5 day course of prednisone 20 mg a day, but this has not helped. She did not take any Percocet since it makes her too drowsy to work. She cannot take NSAIDs since she is on Plavix.    Review of Systems  Constitutional: Negative.   HENT: Negative.   Eyes: Negative.   Respiratory: Negative.   Cardiovascular: Negative for palpitations and leg swelling.       Objective:   Physical Exam  Constitutional: She appears well-developed and well-nourished.  Neck: No thyromegaly present.  Cardiovascular: Normal rate, regular rhythm, normal heart sounds and intact distal pulses.   Pulmonary/Chest: Effort normal and breath sounds normal. No respiratory distress. She has no wheezes. She has no rales.       Tender over the left sternal margin  Lymphadenopathy:    She has no cervical adenopathy.          Assessment & Plan:  This is costochondritis, and it will take a more aggressive course of steroids to make it resolve. She is given a 12 day taper of Prednisone to start on 60 mg a day and to taper down. Add moist heat and apply Icy Hot prn

## 2012-03-30 ENCOUNTER — Telehealth: Payer: Self-pay | Admitting: Cardiology

## 2012-03-30 NOTE — Telephone Encounter (Signed)
Left message for pt that no samples are available as Plavix is now generic

## 2012-03-30 NOTE — Telephone Encounter (Signed)
New msg Pt wants to get samples of plavix. Please call back

## 2012-04-08 DIAGNOSIS — I729 Aneurysm of unspecified site: Secondary | ICD-10-CM | POA: Insufficient documentation

## 2012-05-17 ENCOUNTER — Observation Stay (HOSPITAL_COMMUNITY)
Admission: EM | Admit: 2012-05-17 | Discharge: 2012-05-18 | Disposition: A | Discharge status: Home / Self Care | Payer: BC Managed Care – PPO | Attending: Emergency Medicine | Admitting: Emergency Medicine

## 2012-05-17 ENCOUNTER — Encounter (HOSPITAL_COMMUNITY): Payer: Self-pay | Admitting: Emergency Medicine

## 2012-05-17 ENCOUNTER — Emergency Department (HOSPITAL_COMMUNITY): Payer: BC Managed Care – PPO

## 2012-05-17 ENCOUNTER — Telehealth: Payer: Self-pay | Admitting: Cardiology

## 2012-05-17 DIAGNOSIS — K2970 Gastritis, unspecified, without bleeding: Secondary | ICD-10-CM

## 2012-05-17 DIAGNOSIS — R110 Nausea: Secondary | ICD-10-CM | POA: Insufficient documentation

## 2012-05-17 DIAGNOSIS — F411 Generalized anxiety disorder: Secondary | ICD-10-CM | POA: Insufficient documentation

## 2012-05-17 DIAGNOSIS — I6529 Occlusion and stenosis of unspecified carotid artery: Secondary | ICD-10-CM | POA: Insufficient documentation

## 2012-05-17 DIAGNOSIS — M25519 Pain in unspecified shoulder: Secondary | ICD-10-CM | POA: Insufficient documentation

## 2012-05-17 DIAGNOSIS — R079 Chest pain, unspecified: Principal | ICD-10-CM

## 2012-05-17 DIAGNOSIS — I10 Essential (primary) hypertension: Secondary | ICD-10-CM | POA: Insufficient documentation

## 2012-05-17 LAB — BASIC METABOLIC PANEL WITH GFR
BUN: 12 mg/dL (ref 6–23)
CO2: 28 meq/L (ref 19–32)
Calcium: 10.2 mg/dL (ref 8.4–10.5)
Chloride: 98 meq/L (ref 96–112)
Creatinine, Ser: 0.49 mg/dL — ABNORMAL LOW (ref 0.50–1.10)
GFR calc Af Amer: >90 mL/min (ref >90)
GFR calc non Af Amer: >90 mL/min (ref >90)
Glucose, Bld: 86 mg/dL (ref 70–99)
Potassium: 3.4 meq/L — ABNORMAL LOW (ref 3.5–5.1)
Sodium: 138 meq/L (ref 135–145)

## 2012-05-17 LAB — HEPATIC FUNCTION PANEL
AST: 19 U/L (ref 0–37)
Albumin: 4 g/dL (ref 3.5–5.2)
Alkaline Phosphatase: 71 U/L (ref 39–117)
Total Bilirubin: 0.5 mg/dL (ref 0.3–1.2)
Total Protein: 7.5 g/dL (ref 6.0–8.3)

## 2012-05-17 LAB — POCT I-STAT TROPONIN I: Troponin i, poc: 0 ng/mL (ref 0.00–0.08)

## 2012-05-17 LAB — CBC
HCT: 40 % (ref 36.0–46.0)
Hemoglobin: 14 g/dL (ref 12.0–15.0)
MCH: 32.2 pg (ref 26.0–34.0)
MCHC: 35 g/dL (ref 30.0–36.0)

## 2012-05-17 MED ORDER — METOCLOPRAMIDE HCL 5 MG/ML IJ SOLN
10.0000 mg | Freq: Once | INTRAMUSCULAR | Status: AC
  Administered 2012-05-17: 10 mg via INTRAVENOUS
  Filled 2012-05-17: qty 2

## 2012-05-17 MED ORDER — GI COCKTAIL ~~LOC~~
30.0000 mL | Freq: Once | ORAL | Status: AC
  Administered 2012-05-17: 30 mL via ORAL
  Filled 2012-05-17: qty 30

## 2012-05-17 MED ORDER — KETOROLAC TROMETHAMINE 30 MG/ML IJ SOLN
30.0000 mg | Freq: Once | INTRAMUSCULAR | Status: AC
  Administered 2012-05-17: 30 mg via INTRAVENOUS
  Filled 2012-05-17: qty 1

## 2012-05-17 MED ORDER — ASPIRIN 81 MG PO CHEW
162.0000 mg | CHEWABLE_TABLET | Freq: Once | ORAL | Status: AC
  Administered 2012-05-17: 162 mg via ORAL
  Filled 2012-05-17: qty 2

## 2012-05-17 MED ORDER — POTASSIUM CHLORIDE CRYS ER 20 MEQ PO TBCR
40.0000 meq | EXTENDED_RELEASE_TABLET | Freq: Once | ORAL | Status: AC
  Administered 2012-05-17: 40 meq via ORAL
  Filled 2012-05-17: qty 2

## 2012-05-17 MED ORDER — DIPHENHYDRAMINE HCL 50 MG/ML IJ SOLN
INTRAMUSCULAR | Status: AC
  Administered 2012-05-17: 25 mg
  Filled 2012-05-17: qty 1

## 2012-05-17 NOTE — ED Notes (Signed)
Pt c/o midsternal CP with SOB and nausea starting yesterday; pt with hx of fibromyscular dysplasia; pt sts pain through to back

## 2012-05-17 NOTE — ED Notes (Signed)
Pt c/o "feeling hot" and started to take off gown. MD notified. Administered 25 mg Benadryl IV per MD orders for dystonic reaction.

## 2012-05-17 NOTE — Telephone Encounter (Signed)
Per pt - states since yesterday she has had an increase in belching and heart burn, she has had a feeling difficulty swallowing she reports a chest discomfort - heaviness that is very uncomfortable that radiates into her back and shoulder blades.  She reports taking Nexium, TUMS, and Xanax without relief.  She has had SOB with this discomfort.  She reports taking an extra 1/2 Lisinopril today as her BP is 140/100.  She is asked to report to the ED at [facility] for eval.

## 2012-05-17 NOTE — ED Provider Notes (Addendum)
History     CSN: [id number]  Arrival date & time 05/17/12  1738   First MD Initiated Contact with Patient 05/17/12 1916      Chief Complaint  Patient presents with  . Chest Pain    (Consider location/radiation/quality/duration/timing/severity/associated sxs/prior treatment) HPI Comments: Pt with hx of HTN, pleurisy comes in with cc of chest pain. Pt states that throughout the day she has been having some chest pain that is left sided and radiating to the shoulder area. The pain is dull, and actually the shoulder pain is wrose than the chest pain. She also has some heart burn like pain in addition to the chest pain. The left sided chest pain is dull in nature, and so is the pain in the shoulder. The pain is not exertional, pleuritic, and not with with movement. There is some nausea, and "gas like symptoms." Pt took TUMS and had no relief. She has no cardiac hx, but does have family hx of heart attacks. Pt is not a smoker and there is no hx of PE or risk factors for the same.  Patient is a 46 y.o. female presenting with chest pain. The history is provided by the patient and medical records.  Chest Pain Primary symptoms include abdominal pain and nausea. Pertinent negatives for primary symptoms include no shortness of breath, no cough, no wheezing and no vomiting.     Past Medical History  Diagnosis Date  . Palpitations     hx  . Hypertension     a. echo 7/11: mod LVH, EF 55-65%  . GERD (gastroesophageal reflux disease)   . Insomnia   . Anxiety   . Headache   . History of cardiac cath 2007    cath 11/25/05 by Dr. Jenne Campus with normal cors and EF 50%  . Chest pain     GXT echo 8/11: normal LVF, no ischemia;  h/o neg. myoview in 2005  . Carotid stenosis     Dopplers 6/12:0-39% bilateral; distal ICAs with marked tortuosity; CT angiogram recommended  . 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 Prescott Parma  at [facility]  .  Fibromuscular dysplasia     affects left vertebral,  artery, left ICA, left renal artery;  head and neck CTA 10/24/11: stable pattern of fibromuscular dysplasia of ICA and VA with distal changes suggesting prior pseudoaneurysm and dissection; L dist ICA 60%; normal MRI 10/29/11  . Cerebral aneurysm   . Carotid artery injury     Past Surgical History  Procedure Date  . Breast enhancement surgery 1991    Bilateral breast     Family History  Problem Relation Age of Onset  . Arthritis      family hx  . Breast cancer      relative ,50  . Diabetes      family hx  . Hypertension      family hx  . Kidney disease      family hx  . Lung cancer      family hx  . Coronary artery disease Mother   . Kidney disease Mother   . Heart disease Mother   . Heart attack Father 42    CABG  . Kidney failure Father   . Hypertension Father   . Heart disease Father     History  Substance Use Topics  . Smoking status: Never Smoker   . Smokeless tobacco: Never Used  . Alcohol Use: No  OB History    Grav Para Term Preterm Abortions TAB SAB Ect Mult Living                  Review of Systems  Constitutional: Negative for activity change.  HENT: Negative for facial swelling and neck pain.   Respiratory: Positive for chest tightness. Negative for cough, shortness of breath and wheezing.   Cardiovascular: Positive for chest pain. Negative for leg swelling.  Gastrointestinal: Positive for nausea and abdominal pain. Negative for vomiting, diarrhea, constipation, blood in stool and abdominal distention.  Genitourinary: Negative for dysuria, hematuria and difficulty urinating.  Skin: Negative for color change.  Neurological: Negative for speech difficulty and headaches.  Hematological: Does not bruise/bleed easily.  Psychiatric/Behavioral: Negative for confusion.    Allergies  Amoxicillin and Sulfonamide derivatives  Home Medications   Current Outpatient Rx  Name Route Sig Dispense Refill   . ASPIRIN 81 MG PO TABS Oral Take 81 mg by mouth daily.      Marland Kitchen ESOMEPRAZOLE MAGNESIUM 40 MG PO CPDR Oral Take 1 capsule (40 mg total) by mouth daily before breakfast. 30 capsule 11  . HYDROCHLOROTHIAZIDE 25 MG PO TABS Oral Take 1 tablet (25 mg total) by mouth daily. 30 tablet 11  . HYDROCODONE-ACETAMINOPHEN 5-500 MG PO TABS Oral Take 1 tablet by mouth every 6 (six) hours as needed for pain. 60 tablet 5  . LISINOPRIL 20 MG PO TABS Oral Take 10 mg by mouth daily. Take extra 10 mg at bedtime if BP is up    . METOPROLOL SUCCINATE ER 25 MG PO TB24 Oral Take 1 tablet (25 mg total) by mouth daily. 30 tablet 11  . METOPROLOL TARTRATE 25 MG PO TABS Oral Take 25 mg by mouth every 6 (six) hours as needed. For accelerated heart rate    . PROMETHAZINE HCL 25 MG PO TABS Oral Take 25 mg by mouth 4 (four) times daily as needed. For nausea    . RIZATRIPTAN BENZOATE 10 MG PO TABS Oral Take 1 tablet (10 mg total) by mouth as needed for migraine. May repeat in 2 hours if needed 6 tablet 11  . ZOLPIDEM TARTRATE 10 MG PO TABS Oral Take 10 mg by mouth at bedtime as needed. For insomnia      BP 138/81  Pulse 78  Temp 98.1 F (36.7 C) (Oral)  Resp 18  SpO2 100%  Physical Exam  Constitutional: She is oriented to person, place, and time. She appears well-developed and well-nourished.  HENT:  Head: Normocephalic and atraumatic.  Eyes: EOM are normal. Pupils are equal, round, and reactive to light.  Neck: Neck supple.  Cardiovascular: Normal rate, regular rhythm and normal heart sounds.   No murmur heard. Pulmonary/Chest: Effort normal. No respiratory distress.  Abdominal: Soft. She exhibits no distension. There is no tenderness. There is no rebound and no guarding.  Musculoskeletal:       The chest pain was not reproducible with palpation or with movement of the shoulder  Neurological: She is alert and oriented to person, place, and time.  Skin: Skin is warm and dry.    ED Course  Procedures (including  critical care time)  Labs Reviewed  BASIC METABOLIC PANEL - Abnormal; Notable for the following:    Potassium 3.4 (*)     Creatinine, Ser 0.49 (*)     All other components within normal limits  CBC  PROTIME-INR  POCT I-STAT TROPONIN I   Dg Chest 2 View  05/17/2012  *RADIOLOGY  REPORT*  Clinical Data: Chest pain since yesterday.  Symptoms are worse today.  Shortness of breath.  CHEST - 2 VIEW  Comparison: 03/20/2012  Findings: The cardiomediastinal silhouette is within normal limits. Lungs are free of focal consolidations and pleural effusions. Scoliosis, convex right.  IMPRESSION: Negative exam.  Original Report Authenticated By: Patterson Hammersmith, M.D.     No diagnosis found.    MDM  Differential diagnosis includes: ACS syndrome CHF exacerbation Valvular disorder Myocarditis Pericarditis Pericardial effusion Pneumonia Pleural effusion Pulmonary edema PE Anemia Musculoskeletal pain Cholelithiasis Gastritis Gastroeneteritis  Pt comes in with cc of chest pain radiating to the shoulder. The pain is atypical in nature. Patient also has heart burn with some nausea and epigastric pain. Caridac risk factor includes just the hypertension. Pt has been seen in the ED and by her pcp recently for chest pain, and it appears that she was having pleuritic chest pain at that time. The current pain is different, and it is not the same as her ongoing "heart burn pain." TIMI score is 0. We will admit to CDU and for chest pain rule out. We will also get Gi workup.    Date: 05/17/2012  Rate: 89  Rhythm: normal sinus rhythm  QRS Axis: normal  Intervals: normal  ST/T Wave abnormalities: normal  Conduction Disutrbances:none  Narrative Interpretation:   Old EKG Reviewed: none available         Derwood Kaplan, MD 05/17/12 2031  Derwood Kaplan, MD 05/17/12 2038

## 2012-05-17 NOTE — ED Notes (Signed)
Patient transported to Ultrasound 

## 2012-05-17 NOTE — Telephone Encounter (Signed)
Pt calling re feeling like she has heartburn or indigestion, feeling some chest tightness, dull ache from shoulder around to rib age , not sure heart related, wants to know if needs EKG, pls call [phone]

## 2012-05-17 NOTE — Telephone Encounter (Signed)
Follow-up:    Patient would like to speak with you about her condition.  Please call back.

## 2012-05-18 ENCOUNTER — Observation Stay (HOSPITAL_COMMUNITY): Payer: BC Managed Care – PPO

## 2012-05-18 LAB — POCT I-STAT TROPONIN I: Troponin i, poc: 0 ng/mL (ref 0.00–0.08)

## 2012-05-18 MED ORDER — IOHEXOL 350 MG/ML SOLN
80.0000 mL | Freq: Once | INTRAVENOUS | Status: AC | PRN
  Administered 2012-05-18: 80 mL via INTRAVENOUS

## 2012-05-18 MED ORDER — OXYCODONE-ACETAMINOPHEN 5-325 MG PO TABS
1.0000 | ORAL_TABLET | Freq: Once | ORAL | Status: AC
  Administered 2012-05-18: 1 via ORAL
  Filled 2012-05-18: qty 1

## 2012-05-18 MED ORDER — METOPROLOL TARTRATE 1 MG/ML IV SOLN
INTRAVENOUS | Status: AC
  Filled 2012-05-18: qty 15

## 2012-05-18 MED ORDER — ACETAMINOPHEN 325 MG PO TABS
650.0000 mg | ORAL_TABLET | Freq: Once | ORAL | Status: AC
  Administered 2012-05-18: 650 mg via ORAL
  Filled 2012-05-18: qty 2

## 2012-05-18 MED ORDER — NITROGLYCERIN 0.4 MG SL SUBL
0.4000 mg | SUBLINGUAL_TABLET | Freq: Once | SUBLINGUAL | Status: AC
  Administered 2012-05-18: 0.4 mg via SUBLINGUAL

## 2012-05-18 MED ORDER — METOPROLOL TARTRATE 25 MG PO TABS
100.0000 mg | ORAL_TABLET | Freq: Once | ORAL | Status: AC
  Administered 2012-05-18: 100 mg via ORAL
  Filled 2012-05-18: qty 4

## 2012-05-18 MED ORDER — DIPHENHYDRAMINE HCL 50 MG/ML IJ SOLN: 12.5000 mg | Freq: Once | INTRAMUSCULAR | Status: DC

## 2012-05-18 MED ORDER — NITROGLYCERIN 0.4 MG SL SUBL
SUBLINGUAL_TABLET | SUBLINGUAL | Status: AC
  Administered 2012-05-18: 0.4 mg via SUBLINGUAL
  Filled 2012-05-18: qty 25

## 2012-05-18 MED ORDER — METOCLOPRAMIDE HCL 5 MG/ML IJ SOLN: 10.0000 mg | Freq: Once | INTRAMUSCULAR | Status: DC

## 2012-05-18 NOTE — Progress Notes (Signed)
Observation review is complete. 

## 2012-05-18 NOTE — ED Provider Notes (Signed)
8:12 AM Patient is in the CDU under observation, chest pain protocol.  This is a shared visit with Dr Rhunette Croft.  Sign out received from Dr Norlene Campbell.  Pt reports she has had persistent chest and shoulderblade pain overnight and has developed a headache.  Pain is 8/10 (headache worse than chest pain).  States this is a headache that is typical for her.  Has been given toradol and 1 percocet without relief.  States she usually gets two percocets and thinks this might be why she still has it.  The only thing that has provided some relief was a GI cocktail, which helped with her belching but not the pain.  Patient is A&Ox4, NAD, RRR, no m/r/g, chest nontender, lungs CTAB, abd soft, NT, extremities without edema, distal pulses intact. Pt aware her labwork is normal.  Discussed normal [place] and CXR results with her.  Plan is for coronary CT this morning.    11:41 AM Coronary CT is negative.  Discussed results with patient.  Pt with persistent but improving headache, states she has hx migraines and this feels like typical migraine for her.  Pt initially agreed with migraine cocktail but changed her mind, stating she had something at home she would prefer to take.  Pt d/c home to follow up with PCP and cardiologist (appt with cards tomorrow).  Pt given return precautions.  Pt verbalizes understanding and agrees with plan.     Results for orders placed during the hospital encounter of 05/17/12  CBC      Component Value Range   WBC 9.0  4.0 - 10.5 K/uL   RBC 4.35  3.87 - 5.11 MIL/uL   Hemoglobin 14.0  12.0 - 15.0 g/dL   HCT 16.1  09.6 - 04.5 %   MCV 92.0  78.0 - 100.0 fL   MCH 32.2  26.0 - 34.0 pg   MCHC 35.0  30.0 - 36.0 g/dL   RDW 40.9  81.1 - 91.4 %   Platelets 288  150 - 400 K/uL  BASIC METABOLIC PANEL      Component Value Range   Sodium 138  135 - 145 mEq/L   Potassium 3.4 (*) 3.5 - 5.1 mEq/L   Chloride 98  96 - 112 mEq/L   CO2 28  19 - 32 mEq/L   Glucose, Bld 86  70 - 99 mg/dL   BUN 12  6 - 23 mg/dL   Creatinine, Ser 7.82 (*) 0.50 - 1.10 mg/dL   Calcium 95.6  8.4 - 21.3 mg/dL   GFR calc non Af Amer >90  >90 mL/min   GFR calc Af Amer >90  >90 mL/min  PROTIME-INR      Component Value Range   Prothrombin Time 12.7  11.6 - 15.2 seconds   INR 0.93  0.00 - 1.49  POCT I-STAT TROPONIN I      Component Value Range   Troponin i, poc 0.00  0.00 - 0.08 ng/mL   Comment 3           HEPATIC FUNCTION PANEL      Component Value Range   Total Protein 7.5  6.0 - 8.3 g/dL   Albumin 4.0  3.5 - 5.2 g/dL   AST 19  0 - 37 U/L   ALT 11  0 - 35 U/L   Alkaline Phosphatase 71  39 - 117 U/L   Total Bilirubin 0.5  0.3 - 1.2 mg/dL   Bilirubin, Direct 0.1  0.0 - 0.3 mg/dL  Indirect Bilirubin 0.4  0.3 - 0.9 mg/dL  POCT I-STAT TROPONIN I      Component Value Range   Troponin i, poc 0.00  0.00 - 0.08 ng/mL   Comment 3           POCT I-STAT TROPONIN I      Component Value Range   Troponin i, poc 0.00  0.00 - 0.08 ng/mL   Comment 3           POCT I-STAT TROPONIN I      Component Value Range   Troponin i, poc 0.00  0.00 - 0.08 ng/mL   Comment 3            Dg Chest 2 View  05/17/2012  *RADIOLOGY REPORT*  Clinical Data: Chest pain since yesterday.  Symptoms are worse today.  Shortness of breath.  CHEST - 2 VIEW  Comparison: 03/20/2012  Findings: The cardiomediastinal silhouette is within normal limits. Lungs are free of focal consolidations and pleural effusions. Scoliosis, convex right.  IMPRESSION: Negative exam.  Original Report Authenticated By: Patterson Hammersmith, M.D.   US Abdomen Complete  05/17/2012  *RADIOLOGY REPORT*  Clinical Data:  Abdominal pain.  Nausea, diarrhea.  Hypertension. Low potassium.  Low BUN.  COMPLETE ABDOMINAL ULTRASOUND  Comparison:  MRI 08/20/2011  Findings:  Gallbladder:  Gallbladder has a normal appearance.  Gallbladder wall is 2.5 mm, within normal limits.  No stones or pericholecystic fluid.  No sonographic Murphy's sign.  Common bile duct:  3.4 mm, within normal limits.  Liver:   No focal lesion identified.  Within normal limits in parenchymal echogenicity.  IVC:  Appears normal.  Pancreas:  No focal abnormality seen.  Spleen:  Normal appearance, 7.2 cm.  Right Kidney:  Normal appearance, 11.5 cm.  Left Kidney:  Normal appearance, 11.1 cm.  Abdominal aorta:  Visualized portion is not aneurysmal. Bifurcation is not imaged because of overlying bowel gas.  IMPRESSION:  1.  No evidence for acute cholecystitis. 2.  Normal appearance of the kidneys.  Original Report Authenticated By: Patterson Hammersmith, M.D.   Ct Heart Morp W/cta Cor W/score W/ca W/cm &/or Wo/cm  05/18/2012  *RADIOLOGY REPORT*  INDICATION:  46 year old with chest pain.  CT ANGIOGRAPHY OF THE HEART, CORONARY ARTERY, STRUCTURE, AND MORPHOLOGY  CONTRAST: 80mL OMNIPAQUE IOHEXOL 350 MG/ML SOLN  COMPARISON:  Chest radiograph 07/30/2013and chest CT 11/24/2009  TECHNIQUE:  CT angiography of the coronary vessels was performed on a 256 channel system using prospective ECG gating.  A scout and noncontrast exam (for calcium scoring) were performed.  Circulation time was measured using a test bolus.  Coronary CTA was performed with sub mm slice collimation during portions of the cardiac cycle after prior injection of iodinated contrast.  Imaging post processing was performed on an independent workstation creating multiplanar and 3-D images, and quantitative analysis of the heart and coronary arteries.  Note that this exam targets the heart and the chest was not imaged in its entirety.  PREMEDICATION: Lopressor 100 mg, P.O. Nitroglycerin 0.40 mcg, sublingual.  FINDINGS: Technical quality:  Adequate  Heart rate:  56 beats per minute  CORONARY ARTERIES: Left main coronary artery:  Patent left main coronary artery without plaque or stenosis. Left anterior descending:  Normal.  Prominent D1 branch without stenosis or plaque. Left circumflex:  Normal appearance.  There is a small amount of motion artifact but no gross abnormality.  Right coronary  artery:  Normal. Posterior descending artery:  Normal Dominance:  Right  CORONARY CALCIUM: Total Agatston Score:  Zero MESA database percentile:  Zero  CARDIAC MEASUREMENTS: Interventricular septum (6 - 12 mm): 8 mm  LV posterior wall (6 - 12 mm):  10 mm LV diameter in diastole (35 - 52 mm):  41 mm  AORTA AND PULMONARY MEASUREMENTS: Aortic root (21 - 40 mm):              25 mm  at the annulus             33 mm  at the sinuses of Valsalva             23 mm  at the sinotubular junction Ascending aorta ( <  40 mm):  30 mm Descending aorta ( <  40 mm):  22 mm Main pulmonary artery:  ( <  30 mm):  19 mm  EXTRACARDIAC FINDINGS: There are bilateral breast implants.  No suspicious bone findings. Visualized lungs are clear. No significant pericardial or pleural fluid.  IMPRESSION:  1.  Negative for coronary artery disease.  The patient's total coronary artery calcium score is zero, which is zero percentile for patient's matched age and gender. 2.  No acute chest findings. 3.  Right coronary artery dominance.  Report was called to CDU mid level at [phone] at 10:35 a.m. on 05/18/2012.  Original Report Authenticated By: Richarda Overlie, M.D.      [place], [place] 05/18/12 1143

## 2012-05-18 NOTE — ED Notes (Signed)
States she began having chest pain with SOB yesterday. Describes as dull tooth ache with radiation to her back. Denies chest pain at this time. Complaining of headache. States headache started shortly after arrival. has history of migraine and aneurysm

## 2012-05-18 NOTE — ED Notes (Signed)
Pt. Is on monitor at this time.

## 2012-05-19 ENCOUNTER — Other Ambulatory Visit: Payer: Self-pay | Admitting: *Deleted

## 2012-05-19 DIAGNOSIS — I6529 Occlusion and stenosis of unspecified carotid artery: Secondary | ICD-10-CM

## 2012-05-19 NOTE — ED Provider Notes (Signed)
Medical screening examination/treatment/procedure(s) were conducted as a shared visit with non-physician practitioner(s) and myself.  I personally evaluated the patient during the encounter  Addam Goeller, MD 05/19/12 0039 

## 2012-05-20 ENCOUNTER — Encounter (INDEPENDENT_AMBULATORY_CARE_PROVIDER_SITE_OTHER): Payer: BC Managed Care – PPO

## 2012-05-20 DIAGNOSIS — I6529 Occlusion and stenosis of unspecified carotid artery: Secondary | ICD-10-CM

## 2012-05-24 ENCOUNTER — Telehealth: Payer: Self-pay | Admitting: *Deleted

## 2012-05-24 NOTE — Telephone Encounter (Signed)
Results of c dopplers given to pt--nt

## 2012-05-30 ENCOUNTER — Encounter: Payer: Self-pay | Admitting: Physician Assistant

## 2012-05-30 ENCOUNTER — Ambulatory Visit (INDEPENDENT_AMBULATORY_CARE_PROVIDER_SITE_OTHER): Payer: BC Managed Care – PPO | Admitting: Physician Assistant

## 2012-05-30 VITALS — BP 130/90 | HR 91 | Ht 64.0 in | Wt 153.6 lb

## 2012-05-30 DIAGNOSIS — I10 Essential (primary) hypertension: Secondary | ICD-10-CM

## 2012-05-30 DIAGNOSIS — I7789 Other specified disorders of arteries and arterioles: Secondary | ICD-10-CM

## 2012-05-30 DIAGNOSIS — K219 Gastro-esophageal reflux disease without esophagitis: Secondary | ICD-10-CM

## 2012-05-30 DIAGNOSIS — I773 Arterial fibromuscular dysplasia: Secondary | ICD-10-CM

## 2012-05-30 DIAGNOSIS — R079 Chest pain, unspecified: Secondary | ICD-10-CM

## 2012-05-30 LAB — BASIC METABOLIC PANEL
BUN: 10 mg/dL (ref 6–23)
Chloride: 101 mEq/L (ref 96–112)
Creatinine, Ser: 0.6 mg/dL (ref 0.4–1.2)
Glucose, Bld: 92 mg/dL (ref 70–99)
Potassium: 3.6 mEq/L (ref 3.5–5.1)

## 2012-05-30 LAB — MAGNESIUM: Magnesium: 2.1 mg/dL (ref 1.5–2.5)

## 2012-05-30 MED ORDER — ESOMEPRAZOLE MAGNESIUM 40 MG PO CPDR: 40.0000 mg | DELAYED_RELEASE_CAPSULE | Freq: Every day | ORAL | Status: DC

## 2012-05-30 MED ORDER — PROMETHAZINE HCL 25 MG PO TABS: 25.0000 mg | ORAL_TABLET | Freq: Four times a day (QID) | ORAL | Status: DC | PRN

## 2012-05-30 NOTE — Progress Notes (Signed)
[address] [place], [place]  [address] Phone: [phone] Fax:  [phone]  Date:  05/30/2012   Name:  Caroline Boone   DOB:  1965-12-13   MRN:  [id number]  PCP:  Nelwyn Salisbury, MD  Primary Cardiologist:  Dr. Rollene Rotunda  Primary Electrophysiologist:  None    History of Present Illness: Caroline Boone is a 46 y.o. female who returns for post ED visit followup  She has a h/o HTN and palpitations. She has a history of normal cardiac catheterization in the past, GERD, anxiety. Stress echo 8/11: Normal LV function, no ischemia. Prior Myoview in 2005 also negative. Echocardiogram 6/12: EF 55-60%, PASP 29. The patient had a carotid bruit on exam in 6/12. 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 She also had an MRA in 10/12 demonstrated fibromuscular dysplasia of the renal arteries. She was referred to interventional radiology and was being followed at [facility]. It was felt that surgery for her carotid pseudoaneurysm was too risky. She was placed on dual antiplatelet therapy. She eventually transferred her care to Dr. Dannielle Huh at [facility]. She gets serial scans. She was seen for palpitations earlier this year. An event monitor did not demonstrate any significant arrhythmias. Last seen by Dr. Antoine Poche 12/2011. She was seen in the emergency room 05/18/12 with chest pain. Cardiac markers were negative. Chest x-ray was also negative. Abdominal ultrasound was normal. Cardiac CTA demonstrated normal coronary arteries and a calcium score of 0.  She denies recurrent chest pain. Of note, she did have significant belching with her symptoms. Since her visit to the emergency room, she denies any further chest discomfort. She denies significant dyspnea, orthopnea, PND. She does note occasional drops in her blood pressure. She feels lightheaded with this. Of note, her vascular surgeon at [facility] recently told her she could stop Plavix if she wanted. Overall, she is not completely comfortable with this. She is now being seen on a yearly basis with serial CT scans.    Past Medical History  Diagnosis Date  . Palpitations     hx  . Hypertension     a. echo 7/11: mod LVH, EF 55-65%  . GERD (gastroesophageal reflux disease)   . Insomnia   . Anxiety   . Headache   . History of cardiac cath 2007    cath 11/25/05 by Dr. Jenne Campus with normal cors and EF 50%  . Chest pain     GXT echo 8/11: normal LVF, no ischemia;  h/o neg. myoview in 2005  . Carotid stenosis     Dopplers 6/12:0-39% bilateral; distal ICAs with marked tortuosity; CT angiogram recommended;  followed at [facility]; surgery to correct LICA stenosis and pseudoaneurysm too risky - > medical Rx;  dopplers 8/13: B/L 0-39% (stable)  . 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 Prescott Parma  at [facility]  . Fibromuscular dysplasia     affects left vertebral,  artery, left ICA, left renal artery;  head and neck CTA 10/24/11: stable pattern of fibromuscular dysplasia of ICA and VA with distal changes suggesting prior pseudoaneurysm and dissection; L dist ICA 60%; normal MRI 10/29/11;  followed at [facility] with serial CT scans  . Cerebral aneurysm   . Carotid artery injury     Current Outpatient Prescriptions  Medication Sig Dispense Refill  . ALPRAZolam (  XANAX) 1 MG tablet Take 1 mg by mouth at bedtime as needed.       Marland Kitchen aspirin 81 MG tablet Take 81 mg by mouth daily.        . cyclobenzaprine (FLEXERIL) 10 MG tablet Take 10 mg by mouth 3 (three) times daily as needed.      Marland Kitchen esomeprazole (NEXIUM) 40 MG capsule Take 1 capsule (40 mg total) by mouth daily before breakfast.  30 capsule  11  . hydrochlorothiazide (HYDRODIURIL) 25 MG tablet Take 1 tablet (25 mg total) by mouth daily.  30 tablet  11  . HYDROcodone-acetaminophen (VICODIN) 5-500 MG per tablet Take 1  tablet by mouth every 6 (six) hours as needed for pain.  60 tablet  5  . lisinopril (PRINIVIL,ZESTRIL) 20 MG tablet Take 10 mg by mouth daily. Take extra 10 mg at bedtime if BP is up      . metoprolol succinate (TOPROL XL) 25 MG 24 hr tablet Take 1 tablet (25 mg total) by mouth daily.  30 tablet  11  . metoprolol tartrate (LOPRESSOR) 25 MG tablet Take 25 mg by mouth every 6 (six) hours as needed. For accelerated heart rate       . promethazine (PHENERGAN) 25 MG tablet Take 25 mg by mouth 4 (four) times daily as needed. For nausea      . rizatriptan (MAXALT) 10 MG tablet Take 1 tablet (10 mg total) by mouth as needed for migraine. May repeat in 2 hours if needed  6 tablet  11  . zolpidem (AMBIEN) 10 MG tablet Take 10 mg by mouth at bedtime as needed. For insomnia         Allergies: Allergies  Allergen Reactions  . Amoxicillin Nausea And Vomiting  . Sulfonamide Derivatives Hives    History  Substance Use Topics  . Smoking status: Never Smoker   . Smokeless tobacco: Never Used  . Alcohol Use: No     ROS:  Please see the history of present illness.     All other systems reviewed and negative.   PHYSICAL EXAM: VS:  BP 130/90  Pulse 91  Ht 5\' 4"  (1.626 m)  Wt 153 lb 9.6 oz (69.673 kg)  BMI 26.37 kg/m2 Well nourished, well developed, in no acute distress HEENT: normal Neck: no JVD Cardiac:  normal S1, S2; RRR; no murmur Lungs:  clear to auscultation bilaterally, no wheezing, rhonchi or rales Abd: soft, nontender, no hepatomegaly Ext: no edema Skin: warm and dry Neuro:  CNs 2-12 intact, no focal abnormalities noted  EKG:  Sinus rhythm, heart rate 91, leftward axis, no acute changes      ASSESSMENT AND PLAN:  1. Chest Pain Atypical. She is no evidence of obstructive coronary disease. She had a normal cardiac CT angiogram. No further cardiac workup warranted. Suspect her symptoms were gastrointestinal in nature. She is already taking Nexium. I have recommended that she continue  with her current therapy. If she has recurrent symptoms suggestive of dyspepsia, I would consider seeing gastroenterology.  2. Hypertension She does have episodes of low blood pressure. Her blood pressure is actually borderline high today. She is not 100% certain she maintained a strict low-salt diet. She will monitor this. I have also asked her to continue to check her blood pressure at home. If she has a low blood pressure that is symptomatic, she can hold her lisinopril and HCTZ that morning and recheck it later that afternoon.  She had low potassium in the emergency  room. We will check a basic metabolic panel and magnesium today.  3. Fibromuscular Dysplasia She continues to be followed by vascular for at [facility]. We had a long discussion regarding whether or not she should continue Plavix. She does not really have any contraindications to continuing this. Her surgeon actually gave her the option of stopping if she felt comfortable. Overall, I think she can continue aspirin and Plavix for now. Followup with Dr. Rollene Rotunda as planned.   Luna Glasgow, PA-C  12:33 PM 05/30/2012

## 2012-05-30 NOTE — Patient Instructions (Addendum)
Your physician recommends that you continue on your current medications as directed. Please refer to the Current Medication list given to you today.  If blood pressure in less than 100 it is ok to hold your linsinipril and hctz that morning.  Repeat blood pressure that day.  If blood pressure increases to 140/90 take linsinipril that day  Your physician recommends that you have lab work today: bmet, and magnesium.  Your physician recommends that you make a  follow-up appointment with Dr. Antoine Poche in November.

## 2012-07-22 ENCOUNTER — Ambulatory Visit (INDEPENDENT_AMBULATORY_CARE_PROVIDER_SITE_OTHER): Payer: BC Managed Care – PPO | Admitting: Family Medicine

## 2012-07-22 ENCOUNTER — Encounter: Payer: Self-pay | Admitting: Family Medicine

## 2012-07-22 VITALS — BP 120/82 | HR 90 | Temp 98.0°F | Wt 157.0 lb

## 2012-07-22 DIAGNOSIS — Z23 Encounter for immunization: Secondary | ICD-10-CM

## 2012-07-22 DIAGNOSIS — M1990 Unspecified osteoarthritis, unspecified site: Secondary | ICD-10-CM

## 2012-07-22 DIAGNOSIS — M129 Arthropathy, unspecified: Secondary | ICD-10-CM

## 2012-07-22 LAB — BASIC METABOLIC PANEL
CO2: 26 mEq/L (ref 19–32)
Calcium: 9.4 mg/dL (ref 8.4–10.5)
Creatinine, Ser: 0.7 mg/dL (ref 0.4–1.2)
GFR: 91.77 mL/min (ref >60.00)
Sodium: 137 mEq/L (ref 135–145)

## 2012-07-22 LAB — CBC WITH DIFFERENTIAL/PLATELET
Basophils Absolute: 0 10*3/uL (ref 0.0–0.1)
Eosinophils Absolute: 0.2 10*3/uL (ref 0.0–0.7)
HCT: 42 % (ref 36.0–46.0)
Hemoglobin: 14 g/dL (ref 12.0–15.0)
Lymphs Abs: 3.3 10*3/uL (ref 0.7–4.0)
MCHC: 33.4 g/dL (ref 30.0–36.0)
MCV: 95.4 fl (ref 78.0–100.0)
Monocytes Absolute: 0.6 10*3/uL (ref 0.1–1.0)
Monocytes Relative: 7.3 % (ref 3.0–12.0)
Neutro Abs: 3.7 10*3/uL (ref 1.4–7.7)
Platelets: 297 10*3/uL (ref 150.0–400.0)
RDW: 13.4 % (ref 11.5–14.6)

## 2012-07-22 LAB — SEDIMENTATION RATE: Sed Rate: 10 mm/hr (ref 0–22)

## 2012-07-22 NOTE — Progress Notes (Signed)
  Subjective:    Patient ID: Caroline Boone, female    DOB: 09/11/1966, 46 y.o.   MRN: [id number]  HPI Here for several weeks of diffuse pains and her joints, especially the hands, shoulders, hips, and knees. No swelling. Using Vicodin prn. Her mother has rheumatoid arthritis. No fevers.    Review of Systems  Constitutional: Negative.   Respiratory: Negative.   Cardiovascular: Negative.   Musculoskeletal: Positive for back pain and arthralgias. Negative for myalgias and joint swelling.       Objective:   Physical Exam  Constitutional: She is oriented to person, place, and time. She appears well-developed and well-nourished.  Neck: Neck supple. No thyromegaly present.  Cardiovascular: Normal rate, regular rhythm, normal heart sounds and intact distal pulses.   Pulmonary/Chest: Effort normal and breath sounds normal.  Musculoskeletal:       Numerous tiny nodules over the DIPs and PIPs of both hands, no swelling. Her hands are tender. The wrists are spared. Tender in the neck with full ROM.   Neurological: She is alert and oriented to person, place, and time.          Assessment & Plan:  This is consistent with an arthritis of some sort, possibly inflammatory. Get labs today

## 2012-07-22 NOTE — Addendum Note (Signed)
Addended by: Aniceto Boss A on: 07/22/2012 03:31 PM   Modules accepted: Orders

## 2012-07-25 ENCOUNTER — Ambulatory Visit: Payer: BC Managed Care – PPO | Admitting: Family Medicine

## 2012-07-25 LAB — ANA: Anti Nuclear Antibody(ANA): NEGATIVE

## 2012-07-25 NOTE — Progress Notes (Signed)
Quick Note:  I spoke with pt ______ 

## 2012-08-31 ENCOUNTER — Ambulatory Visit (INDEPENDENT_AMBULATORY_CARE_PROVIDER_SITE_OTHER): Payer: BC Managed Care – PPO | Admitting: Cardiology

## 2012-08-31 ENCOUNTER — Encounter: Payer: Self-pay | Admitting: Cardiology

## 2012-08-31 VITALS — BP 115/80 | HR 83 | Ht 64.0 in | Wt 155.0 lb

## 2012-08-31 DIAGNOSIS — I10 Essential (primary) hypertension: Secondary | ICD-10-CM

## 2012-08-31 DIAGNOSIS — R079 Chest pain, unspecified: Secondary | ICD-10-CM

## 2012-08-31 DIAGNOSIS — R0602 Shortness of breath: Secondary | ICD-10-CM

## 2012-08-31 NOTE — Patient Instructions (Addendum)
The current medical regimen is effective;  continue present plan and medications.  Follow up in 1 year with Dr Hochrein.  You will receive a letter in the mail 2 months before you are due.  Please call us when you receive this letter to schedule your follow up appointment.  

## 2012-08-31 NOTE — Progress Notes (Signed)
HPI The patient resents for follow up of chest pain and carotid pseudoaneurysm.  Earlier this year she had a coronary CT angiogram which demonstrated a calcium score of 0 and no suggestion of coronary disease.  Since that time the patient has followed at [facility] for her carotid pseudoaneurysm and fibromuscular dysplasia. She apparently has stable anatomy. We did Dopplers here in the summer which demonstrated no new carotid findings. She has had no new symptoms since I last saw her. She has been walking or exercise and doing other moderate aerobic activities without any symptoms. She rarely has palpitations and has no presyncope or syncope. She has had no chest pressure, neck or arm discomfort. She had one episode of some left side jaw tingling but no visual disturbances, speech or motor.   Allergies  Allergen Reactions  . Amoxicillin Nausea And Vomiting  . Sulfonamide Derivatives Hives    Current Outpatient Prescriptions  Medication Sig Dispense Refill  . ALPRAZolam (XANAX) 1 MG tablet Take 1 mg by mouth at bedtime as needed.       Marland Kitchen aspirin 81 MG tablet Take 81 mg by mouth daily.        . cyclobenzaprine (FLEXERIL) 10 MG tablet Take 10 mg by mouth 3 (three) times daily as needed.      Marland Kitchen esomeprazole (NEXIUM) 40 MG capsule Take 1 capsule (40 mg total) by mouth daily before breakfast.  30 capsule  5  . hydrochlorothiazide (HYDRODIURIL) 25 MG tablet Take 1 tablet (25 mg total) by mouth daily.  30 tablet  11  . HYDROcodone-acetaminophen (VICODIN) 5-500 MG per tablet Take 1 tablet by mouth every 6 (six) hours as needed for pain.  60 tablet  5  . lisinopril (PRINIVIL,ZESTRIL) 20 MG tablet Take 20 mg by mouth. Take 1/2 tablet at night and if B/P is elevated then take another 1/2 tablet      . metoprolol succinate (TOPROL XL) 25 MG 24 hr tablet Take 1 tablet (25 mg total) by mouth daily.  30 tablet  11  . metoprolol tartrate (LOPRESSOR) 25 MG tablet Take 25 mg by mouth every 6 (six) hours as needed. For  accelerated heart rate       . promethazine (PHENERGAN) 25 MG tablet Take 1 tablet (25 mg total) by mouth 4 (four) times daily as needed. For nausea  30 tablet  5  . rizatriptan (MAXALT) 10 MG tablet Take 1 tablet (10 mg total) by mouth as needed for migraine. May repeat in 2 hours if needed  6 tablet  11  . zolpidem (AMBIEN) 10 MG tablet Take 10 mg by mouth at bedtime as needed. For insomnia         Past Medical History  Diagnosis Date  . Palpitations     hx  . Hypertension     a. echo 7/11: mod LVH, EF 55-65%  . GERD (gastroesophageal reflux disease)   . Insomnia   . Anxiety   . Headache   . History of cardiac cath 2007    cath 11/25/05 by Dr. Jenne Campus with normal cors and EF 50%  . Chest pain     GXT echo 8/11: normal LVF, no ischemia;  h/o neg. myoview in 2005  . Carotid stenosis     Dopplers 6/12:0-39% bilateral; distal ICAs with marked tortuosity; CT angiogram recommended;  followed at [facility]; surgery to correct LICA stenosis and pseudoaneurysm too risky - > medical Rx;  dopplers 8/13: B/L 0-39% (stable)  . 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 Prescott Parma  at [facility]  . Fibromuscular dysplasia     affects left vertebral,  artery, left ICA, left renal artery;  head and neck CTA 10/24/11: stable pattern of fibromuscular dysplasia of ICA and VA with distal changes suggesting prior pseudoaneurysm and dissection; L dist ICA 60%; normal MRI 10/29/11;  followed at [facility] with serial CT scans  . Cerebral aneurysm   . Carotid artery injury     Past Surgical History  Procedure Date  . Breast enhancement surgery 1991    Bilateral breast     ROS:  As stated in the HPI and negative for all other systems.  PHYSICAL EXAM BP 115/80  Pulse 83  Ht 5\' 4"  (1.626 m)  Wt 155 lb (70.308 kg)  BMI 26.61 kg/m2 GENERAL:  Well appearing HEENT:  Pupils equal round and reactive, fundi not visualized, oral mucosa unremarkable NECK:  No jugular  venous distention, waveform within normal limits, carotid upstroke brisk and symmetric, loud left carotid bruit, no thyromegaly LYMPHATICS:  No cervical, inguinal adenopathy LUNGS:  Clear to auscultation bilaterally BACK:  No CVA tenderness CHEST:  Unremarkable HEART:  PMI not displaced or sustained,S1 and S2 within normal limits, no S3, no S4, no clicks, no rubs, no murmurs ABD:  Flat, positive bowel sounds normal in frequency in pitch, no bruits, no rebound, no guarding, no midline pulsatile mass, no hepatomegaly, no splenomegaly EXT:  2 plus pulses throughout, no edema, no cyanosis no clubbing   ASSESSMENT AND PLAN  Chest pain - The patient has no ongoing symptoms. No further cardiovascular testing is suggested.  Palpitations -  The patient is not particularly bothered by palpitations. She can continue with her low dose of beta blocker.  HYPERTENSION -  Her blood pressure is well controlled. She will continue the meds as listed.  Fibromuscular dysplasia -  This is now being followed closely at [facility].  We will forego any further Dopplers as they are doing the image clear.

## 2012-09-20 ENCOUNTER — Telehealth: Payer: Self-pay | Admitting: Family Medicine

## 2012-09-20 NOTE — Telephone Encounter (Signed)
Refill request for Zolpidem Tartrate 10 mg take 1 po qhs and last here on 07/22/12.

## 2012-09-20 NOTE — Telephone Encounter (Signed)
Call in #[phone] with 5 rf 

## 2012-09-21 MED ORDER — ZOLPIDEM TARTRATE 10 MG PO TABS: 10.0000 mg | ORAL_TABLET | Freq: Every evening | ORAL | Status: DC | PRN

## 2012-09-21 NOTE — Telephone Encounter (Signed)
I called in script 

## 2012-09-26 ENCOUNTER — Other Ambulatory Visit: Payer: Self-pay | Admitting: Family Medicine

## 2012-09-26 NOTE — Telephone Encounter (Signed)
Call in #60 with 5 rf 

## 2012-10-17 ENCOUNTER — Other Ambulatory Visit: Payer: Self-pay | Admitting: Family Medicine

## 2012-10-18 ENCOUNTER — Telehealth: Payer: Self-pay | Admitting: Family Medicine

## 2012-10-18 NOTE — Telephone Encounter (Signed)
Refill request for Alprazolam 1 mg take 1 po tid prn

## 2012-10-21 MED ORDER — ALPRAZOLAM 1 MG PO TABS: 1.0000 mg | ORAL_TABLET | Freq: Three times a day (TID) | ORAL | Status: DC | PRN

## 2012-10-21 NOTE — Telephone Encounter (Signed)
Rx called in to pharmacy. 

## 2012-10-21 NOTE — Telephone Encounter (Signed)
Call in #[phone] with 5 rf 

## 2012-11-11 ENCOUNTER — Other Ambulatory Visit: Payer: Self-pay | Admitting: Physician Assistant

## 2012-11-15 ENCOUNTER — Other Ambulatory Visit: Payer: Self-pay | Admitting: Physician Assistant

## 2012-11-22 ENCOUNTER — Ambulatory Visit (INDEPENDENT_AMBULATORY_CARE_PROVIDER_SITE_OTHER): Payer: BC Managed Care – PPO | Admitting: Family Medicine

## 2012-11-22 ENCOUNTER — Encounter: Payer: Self-pay | Admitting: Family Medicine

## 2012-11-22 VITALS — BP 110/70 | HR 91 | Temp 98.0°F | Resp 18 | Wt 155.0 lb

## 2012-11-22 DIAGNOSIS — J329 Chronic sinusitis, unspecified: Secondary | ICD-10-CM

## 2012-11-22 MED ORDER — HYDROCODONE-HOMATROPINE 5-1.5 MG/5ML PO SYRP: 5.0000 mL | ORAL_SOLUTION | Freq: Three times a day (TID) | ORAL | Status: DC | PRN

## 2012-11-22 MED ORDER — DOXYCYCLINE HYCLATE 100 MG PO TABS: 100.0000 mg | ORAL_TABLET | Freq: Two times a day (BID) | ORAL | Status: DC

## 2012-11-22 NOTE — Patient Instructions (Addendum)
INSTRUCTIONS FOR UPPER RESPIRATORY INFECTION:  -As we discussed, we have prescribed a new medication (DOXYCYCLINE AND A COUGH MEDICATION) for you at this appointment. We discussed the common and serious potential adverse effects of this medication and you can review these and more with the pharmacist when you pick up your medication.  Please follow the instructions for use carefully and notify us immediately if you have any problems taking this medication.  -plenty of rest and fluids  -nasal saline wash 2-3 times daily (use prepackaged nasal saline or bottled/distilled water if making your own)    -can use sinex or afrin nasal spray for drainage and nasal congestion - but do NOT use longer then 3-4 days  -can use tylenol or ibuprofen as directed for aches and sorethroat  -in the winter time, using a humidifier at night is helpful (please follow cleaning instructions)  -if you are taking a cough medication - use only as directed, may also try a teaspoon of honey to coat the throat and throat lozenges  -for sore throat, salt water gargles can help  -follow up if you have fevers, facial pain, tooth pain, difficulty breathing or are worsening or not getting better in 5-7 days

## 2012-11-22 NOTE — Progress Notes (Signed)
Chief Complaint  Patient presents with  . Cough    HPI:  Acute visit for sinus congestion: -started: has had a "cold" for 3 weeks, has gotten worse the last 4 days with L max sinus pain -symptoms:nasal congestion, sore throat, cough, L max sinus pain, L upper max tooth pain, low grade fever yesterday -denies:SOB, NVD, body aches, flu exposure -has tried:  Tylenol, musinex, vicks vapor rub -sick contacts: none known -Hx of: sinus infection -allergic to amoxicillin - vomiting, sulfa - hives   ROS: See pertinent positives and negatives per HPI.  Past Medical History  Diagnosis Date  . Palpitations     hx  . Hypertension     a. echo 7/11: mod LVH, EF 55-65%  . GERD (gastroesophageal reflux disease)   . Insomnia   . Anxiety   . Headache   . History of cardiac cath 2007    cath 11/25/05 by Dr. Jenne Campus with normal cors and EF 50%  . Chest pain     GXT echo 8/11: normal LVF, no ischemia;  h/o neg. myoview in 2005  . Carotid stenosis     Dopplers 6/12:0-39% bilateral; distal ICAs with marked tortuosity; CT angiogram recommended;  followed at [facility]; surgery to correct LICA stenosis and pseudoaneurysm too risky - > medical Rx;  dopplers 8/13: B/L 0-39% (stable)  . 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 Prescott Parma  at [facility]  . Fibromuscular dysplasia     affects left vertebral,  artery, left ICA, left renal artery;  head and neck CTA 10/24/11: stable pattern of fibromuscular dysplasia of ICA and VA with distal changes suggesting prior pseudoaneurysm and dissection; L dist ICA 60%; normal MRI 10/29/11;  followed at [facility] with serial CT scans  . Cerebral aneurysm   . Carotid artery injury     Family History  Problem Relation Age of Onset  . Arthritis      family hx  . Breast cancer      relative ,50  . Diabetes      family hx  . Hypertension      family hx  . Kidney disease      family hx  . Lung cancer      family hx  .  Coronary artery disease Mother   . Kidney disease Mother   . Heart disease Mother   . Heart attack Father 64    CABG  . Kidney failure Father   . Hypertension Father   . Heart disease Father     History   Social History  . Marital Status: Divorced    Spouse Name: N/A    Number of Children: N/A  . Years of Education: N/A   Social History Main Topics  . Smoking status: Never Smoker   . Smokeless tobacco: Never Used  . Alcohol Use: No  . Drug Use: No  . Sexually Active: None   Other Topics Concern  . None   Social History Narrative  . None    Current outpatient prescriptions:cloNIDine (CATAPRES) 0.1 MG tablet, Take 0.1 mg by mouth as needed., Disp: , Rfl: ;  ALPRAZolam (XANAX) 1 MG tablet, Take 1 tablet (1 mg total) by mouth 3 (three) times daily as needed., Disp: 90 tablet, Rfl: 5;  aspirin 81 MG tablet, Take 81 mg by mouth daily.  , Disp: , Rfl: ;  cyclobenzaprine (FLEXERIL) 10 MG tablet, Take 10 mg by mouth 3 (three) times  daily as needed., Disp: , Rfl:  doxycycline (VIBRA-TABS) 100 MG tablet, Take 1 tablet (100 mg total) by mouth 2 (two) times daily., Disp: 20 tablet, Rfl: 0;  esomeprazole (NEXIUM) 40 MG capsule, Take 1 capsule (40 mg total) by mouth daily before breakfast., Disp: 30 capsule, Rfl: 5;  hydrochlorothiazide (HYDRODIURIL) 25 MG tablet, take 1 tablet by mouth once daily, Disp: 30 tablet, Rfl: 2 HYDROcodone-acetaminophen (VICODIN) 5-500 MG per tablet, take 1 tablet by mouth every 6 hours if needed, Disp: 60 tablet, Rfl: 5;  HYDROcodone-homatropine (HYCODAN) 5-1.5 MG/5ML syrup, Take 5 mLs by mouth every 8 (eight) hours as needed for cough., Disp: 120 mL, Rfl: 0;  lisinopril (PRINIVIL,ZESTRIL) 20 MG tablet, Take 20 mg by mouth. Take 1/2 tablet at night and if B/P is elevated then take another 1/2 tablet, Disp: , Rfl:  metoprolol succinate (TOPROL-XL) 25 MG 24 hr tablet, take 1 tablet by mouth once daily, Disp: 30 tablet, Rfl: 2;  metoprolol tartrate (LOPRESSOR) 25 MG  tablet, Take 25 mg by mouth every 6 (six) hours as needed. For accelerated heart rate , Disp: , Rfl: ;  promethazine (PHENERGAN) 25 MG tablet, Take 1 tablet (25 mg total) by mouth 4 (four) times daily as needed. For nausea, Disp: 30 tablet, Rfl: 5 rizatriptan (MAXALT) 10 MG tablet, Take 1 tablet (10 mg total) by mouth as needed for migraine. May repeat in 2 hours if needed, Disp: 6 tablet, Rfl: 11;  zolpidem (AMBIEN) 10 MG tablet, Take 1 tablet (10 mg total) by mouth at bedtime as needed. For insomnia, Disp: 30 tablet, Rfl: 5  EXAM:  Filed Vitals:   11/22/12 1600  BP: 110/70  Pulse: 91  Temp: 98 F (36.7 C)  Resp: 18    There is no height on file to calculate BMI.  GENERAL: vitals reviewed and listed above, alert, oriented, appears well hydrated and in no acute distress  HEENT: atraumatic, conjunttiva clear, no obvious abnormalities on inspection of external nose and ears, normal appearance of ear canals and TMs, yellow nasal congestion, mild post oropharyngeal erythema with PND, no tonsillar edema or exudate, R max sinus TTP  NECK: no obvious masses on inspection  LUNGS: clear to auscultation bilaterally, no wheezes, rales or rhonchi, good air movement  CV: HRRR, no peripheral edema  MS: moves all extremities without noticeable abnormality  PSYCH: pleasant and cooperative, no obvious depression or anxiety  ASSESSMENT AND PLAN:  Discussed the following assessment and plan:  1. Sinusitis  HYDROcodone-homatropine (HYCODAN) 5-1.5 MG/5ML syrup, doxycycline (VIBRA-TABS) 100 MG tablet   -tx with abx and supportive care and saline irrigation - risks discussed for cough medication and antibiotic -Patient advised to return or notify a doctor immediately if symptoms worsen or persist or new concerns arise.  Patient Instructions  INSTRUCTIONS FOR UPPER RESPIRATORY INFECTION:  -As we discussed, we have prescribed a new medication (DOXYCYCLINE AND A COUGH MEDICATION) for you at this  appointment. We discussed the common and serious potential adverse effects of this medication and you can review these and more with the pharmacist when you pick up your medication.  Please follow the instructions for use carefully and notify us immediately if you have any problems taking this medication.  -plenty of rest and fluids  -nasal saline wash 2-3 times daily (use prepackaged nasal saline or bottled/distilled water if making your own)    -can use sinex or afrin nasal spray for drainage and nasal congestion - but do NOT use longer then 3-4 days  -  can use tylenol or ibuprofen as directed for aches and sorethroat  -in the winter time, using a humidifier at night is helpful (please follow cleaning instructions)  -if you are taking a cough medication - use only as directed, may also try a teaspoon of honey to coat the throat and throat lozenges  -for sore throat, salt water gargles can help  -follow up if you have fevers, facial pain, tooth pain, difficulty breathing or are worsening or not getting better in 5-7 days      KIM, Dahlia Client R.

## 2013-01-16 ENCOUNTER — Other Ambulatory Visit: Payer: Self-pay

## 2013-01-16 DIAGNOSIS — Z1231 Encounter for screening mammogram for malignant neoplasm of breast: Secondary | ICD-10-CM

## 2013-01-26 ENCOUNTER — Telehealth: Payer: Self-pay | Admitting: Family Medicine

## 2013-01-26 NOTE — Telephone Encounter (Signed)
Pt needs refill of HYDROcodone-acetaminophen (VICODIN) 5-500 MG per tablet. Pharm told pt that she needed to call and get it changed to the new 325 MG. Pt asked if MD would consider changing to HYDROcodone-acetaminophen 7.5-325.  (Only if he feels its ok)  Pt says she has been having to take them more often due to stress headaches..  Pt is going out of town on 01/27/13 (FRiday) and would like today if possible. Pharm: [facility]/ [address]

## 2013-01-27 ENCOUNTER — Telehealth: Payer: Self-pay | Admitting: Family Medicine

## 2013-01-27 MED ORDER — HYDROCODONE-ACETAMINOPHEN 5-325 MG PO TABS: 1.0000 | ORAL_TABLET | Freq: Four times a day (QID) | ORAL | Status: DC | PRN

## 2013-01-27 NOTE — Telephone Encounter (Signed)
I called in script 

## 2013-01-27 NOTE — Telephone Encounter (Signed)
Switch the Vicodin to 5/325 q 6 hours prn pain, #60 with 5 rf

## 2013-01-27 NOTE — Telephone Encounter (Signed)
Request is only to change the dose on Vicodin. It was last filled on 12/02/12 with 4 refills left.

## 2013-01-30 ENCOUNTER — Ambulatory Visit
Admission: RE | Admit: 2013-01-30 | Discharge: 2013-01-30 | Disposition: A | Discharge status: Home / Self Care | Payer: BC Managed Care – PPO | Source: Ambulatory Visit

## 2013-01-30 DIAGNOSIS — Z1231 Encounter for screening mammogram for malignant neoplasm of breast: Secondary | ICD-10-CM

## 2013-01-30 NOTE — Telephone Encounter (Signed)
We did this on 01-27-13

## 2013-02-07 ENCOUNTER — Ambulatory Visit (INDEPENDENT_AMBULATORY_CARE_PROVIDER_SITE_OTHER): Payer: BC Managed Care – PPO | Admitting: Family Medicine

## 2013-02-07 ENCOUNTER — Encounter: Payer: Self-pay | Admitting: Family Medicine

## 2013-02-07 VITALS — BP 118/70 | HR 98 | Temp 98.8°F | Wt 150.0 lb

## 2013-02-07 DIAGNOSIS — M545 Low back pain, unspecified: Secondary | ICD-10-CM

## 2013-02-07 DIAGNOSIS — M79604 Pain in right leg: Secondary | ICD-10-CM

## 2013-02-07 NOTE — Progress Notes (Signed)
  Subjective:    Patient ID: Caroline Boone, female    DOB: 10/23/1965, 47 y.o.   MRN: [id number]  HPI Here for worsening low back pain. This started about 18 months ago and it is steadily getting worse. Now the pain radiates down both legs to the feet, and she has numbness in both feet and both great toes. No weakness in the legs. She takes Tylenol most days and occasionally some Vicodin for more severe pain. No hx of trauma.    Review of Systems  Constitutional: Negative.   Musculoskeletal: Positive for back pain.  Neurological: Positive for numbness.       Objective:   Physical Exam  Constitutional: She appears well-developed and well-nourished.  Cardiovascular: Intact distal pulses.   Musculoskeletal:  Tender in the lower back with little spasm. ROM is full. Negative SLR.          Assessment & Plan:  Possible lumbar disc disease or spinal stenosis. Get an MRI soon to assess. She is already exercising daily with walking or riding an elliptical machine.

## 2013-02-12 ENCOUNTER — Ambulatory Visit
Admission: RE | Admit: 2013-02-12 | Discharge: 2013-02-12 | Disposition: A | Discharge status: Home / Self Care | Payer: BC Managed Care – PPO | Source: Ambulatory Visit | Attending: Family Medicine | Admitting: Family Medicine

## 2013-02-12 DIAGNOSIS — M545 Low back pain: Secondary | ICD-10-CM

## 2013-02-12 DIAGNOSIS — M79604 Pain in right leg: Secondary | ICD-10-CM

## 2013-02-13 NOTE — Progress Notes (Signed)
Quick Note:  I spoke with pt ______ 

## 2013-02-14 ENCOUNTER — Telehealth: Payer: Self-pay | Admitting: *Deleted

## 2013-02-14 NOTE — Telephone Encounter (Signed)
Pt called stating she received her report from her mri , but is concerned that she toe numbness and neck pain that wasn't discussed in mri

## 2013-02-14 NOTE — Telephone Encounter (Signed)
I would suggest she see a Neurologist about the pain and numbness. If she agrees, I can do a referral

## 2013-02-14 NOTE — Telephone Encounter (Signed)
States she received results from mri ,but her fingers are numb and she has a painful neck.  Any suggestions?

## 2013-02-15 NOTE — Telephone Encounter (Signed)
Called and spoke with pt and pt states she has a neurologist at [facility].  She has an upcoming appt in June and will call to see if she can be seen sooner.

## 2013-02-15 NOTE — Telephone Encounter (Signed)
Left a message for pt to return call 

## 2013-03-31 ENCOUNTER — Telehealth: Payer: Self-pay | Admitting: Cardiology

## 2013-03-31 NOTE — Telephone Encounter (Signed)
New Prob      Pt has some questions and concerns regarding her BP/medication. Pt also states she is having some discomfort around her shoulder blade and her chest. Would like to speak to nurse.

## 2013-03-31 NOTE — Telephone Encounter (Signed)
Per pt call  States BP has been really low to the point where she stayed out of work on Monday.  She reports BP as low as 95/61 with lightheadedness and fatigue.  She stopped her Lisinopril but has had her BP go back up to 157/120 and she took her Lisinopril then,  She is also complaining of chest discomfort that wraps around into her back.  Advised if this continues or gets worse she should go to the ED for evaluation.  appt given for 6/18 with Dr Antoine Poche.  She is aware this is an add on appointment

## 2013-04-05 ENCOUNTER — Ambulatory Visit: Payer: BC Managed Care – PPO | Admitting: Cardiology

## 2013-04-06 ENCOUNTER — Other Ambulatory Visit: Payer: Self-pay | Admitting: Family Medicine

## 2013-04-07 NOTE — Telephone Encounter (Signed)
Okay for 6 months 

## 2013-04-13 ENCOUNTER — Encounter: Payer: Self-pay | Admitting: Cardiology

## 2013-04-22 ENCOUNTER — Other Ambulatory Visit: Payer: Self-pay | Admitting: Physician Assistant

## 2013-06-01 ENCOUNTER — Other Ambulatory Visit: Payer: Self-pay | Admitting: Cardiology

## 2013-06-01 ENCOUNTER — Other Ambulatory Visit: Payer: Self-pay | Admitting: Family Medicine

## 2013-06-02 NOTE — Telephone Encounter (Signed)
Refill once until primary returns. 

## 2013-06-02 NOTE — Telephone Encounter (Signed)
Can we refill for a month supply? Pt was last here on 02/07/13.

## 2013-06-28 ENCOUNTER — Ambulatory Visit (INDEPENDENT_AMBULATORY_CARE_PROVIDER_SITE_OTHER): Payer: BC Managed Care – PPO | Admitting: Family Medicine

## 2013-06-28 ENCOUNTER — Telehealth: Payer: Self-pay | Admitting: Cardiology

## 2013-06-28 ENCOUNTER — Encounter: Payer: Self-pay | Admitting: Family Medicine

## 2013-06-28 VITALS — BP 112/80 | HR 101 | Temp 98.2°F | Wt 155.0 lb

## 2013-06-28 DIAGNOSIS — M25569 Pain in unspecified knee: Secondary | ICD-10-CM

## 2013-06-28 DIAGNOSIS — R0781 Pleurodynia: Secondary | ICD-10-CM

## 2013-06-28 DIAGNOSIS — R079 Chest pain, unspecified: Secondary | ICD-10-CM

## 2013-06-28 DIAGNOSIS — M25562 Pain in left knee: Secondary | ICD-10-CM

## 2013-06-28 DIAGNOSIS — R071 Chest pain on breathing: Secondary | ICD-10-CM

## 2013-06-28 DIAGNOSIS — R0989 Other specified symptoms and signs involving the circulatory and respiratory systems: Secondary | ICD-10-CM

## 2013-06-28 DIAGNOSIS — R0609 Other forms of dyspnea: Secondary | ICD-10-CM

## 2013-06-28 DIAGNOSIS — R0600 Dyspnea, unspecified: Secondary | ICD-10-CM

## 2013-06-28 LAB — D-DIMER, QUANTITATIVE: D-Dimer, Quant: 0.27 ug/mL-FEU (ref 0.00–0.48)

## 2013-06-28 LAB — BRAIN NATRIURETIC PEPTIDE: Brain Natriuretic Peptide: 11.4 pg/mL (ref 0.0–100.0)

## 2013-06-28 NOTE — Telephone Encounter (Signed)
Spoke with patient who complains of pain in her left rib cage every time she takes a deep breath.  Patient states this has occurred intermittently for a while but yesterday she experienced a heaviness under her right breast which she has never experienced before.  Patient states she felt like if she could get out of the car and take a deep breath that she would feel better so she did and this did provide some relief.  Patient states she was belching frequently after this incident and that she had not been taking Nexium for a while but that she has begun taking it again.  Patient states her BP has also been low for her (90's over 60's) so she has not taken her Lisinopril the past 2 nights.  Patient states there is an occasional pain in her throat as well and I questioned whether she was still being followed by [facility] for her carotid pseudoaneurysm and fibromuscular dysplasia.  Patient states she was last seen in July and the doctor told her that her scans revealed no change.  I discussed the patient with Norma Fredrickson, NP regarding an appointment with her on Friday as Dr. Antoine Poche is out of the office.  Lawson Fiscal advised that patient call PCP as symptoms do not seem cardiac related since patient has no hx of CAD.  I advised patient of Lori's advice and to call her PCP since Dr. Antoine Poche is not in the office this week.  Patient verbalized understanding and agreement with plan of care.

## 2013-06-28 NOTE — Progress Notes (Signed)
Subjective:    Patient ID: Caroline Boone, female    DOB: 07-01-1966, 47 y.o.   MRN: [id number]  HPI Patient is seen as a work in with chief complaint of shortness of breath with activity First episode apparently occurred sometime last week when she was at the [facility] on exercise cycle. She noticed after about 10 minutes of exercise. She had couple episodes last week and then again Saturday when walking outside in the heat. She denied any associated chest pain with her walking. She has however had some bilateral pleuritic pain over the past several days. No recent travels. No increased peripheral edema. Denies any hemoptysis. No recent cough, fever, or chills.  She denies any dyspnea at rest. Patient is an ex-smoker. She has hypertension which is treated with medication. No history of diabetes. Her mother had coronary disease in 9s and father had CABG age 19.  Patient has history of left carotid dissection reportedly with history of fibromuscular dysplasia. She had a stress echo back in 2011 which was unremarkable and echocardiogram June 2012 ejection fraction 55-60%. Cath 2007 normal coronaries.  Patient also complains some recent right knee pain. No injury. Pain has poorly localized and mostly anterior knee. She's not any ecchymosis or significant erythema. Possibly some mild warmth. No locking or giving way. No hx of gout.  Past Medical History  Diagnosis Date  . Palpitations     hx  . Hypertension     a. echo 7/11: mod LVH, EF 55-65%  . GERD (gastroesophageal reflux disease)   . Insomnia   . Anxiety   . Headache(784.0)   . History of cardiac cath 2007    cath 11/25/05 by Dr. Jenne Campus with normal cors and EF 50%  . Chest pain     GXT echo 8/11: normal LVF, no ischemia;  h/o neg. myoview in 2005  . Carotid stenosis     Dopplers 6/12:0-39% bilateral; distal ICAs with marked tortuosity; CT angiogram recommended;  followed at [facility]; surgery to correct LICA stenosis and pseudoaneurysm too  risky - > medical Rx;  dopplers 8/13: B/L 0-39% (stable)  . 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 Prescott Parma  at [facility]  . Fibromuscular dysplasia     affects left vertebral,  artery, left ICA, left renal artery;  head and neck CTA 10/24/11: stable pattern of fibromuscular dysplasia of ICA and VA with distal changes suggesting prior pseudoaneurysm and dissection; L dist ICA 60%; normal MRI 10/29/11;  followed at [facility] with serial CT scans  . Cerebral aneurysm   . Carotid artery injury    Past Surgical History  Procedure Laterality Date  . Breast enhancement surgery  1991    Bilateral breast     reports that she has never smoked. She has never used smokeless tobacco. She reports that she does not drink alcohol or use illicit drugs. family history includes Arthritis in an other family member; Breast cancer in an other family member; Coronary artery disease in her mother; Diabetes in an other family member; Heart attack (age of onset: 58) in her father; Heart disease in her father and mother; Hypertension in her father and another family member; Kidney disease in her mother and another family member; Kidney failure in her father; Lung cancer in an other family member. Allergies  Allergen Reactions  . Amoxicillin Nausea And Vomiting  . Sulfonamide Derivatives Hives   And a heard in a call you and  Review of Systems  Constitutional: Positive for fatigue. Negative for fever, chills and appetite change.  Respiratory: Positive for shortness of breath. Negative for cough and wheezing.   Cardiovascular: Positive for chest pain. Negative for palpitations and leg swelling.  Gastrointestinal: Negative for abdominal pain.  Neurological: Negative for dizziness and syncope.  Hematological: Negative for adenopathy.       Objective:   Physical Exam  Constitutional: She appears well-developed and well-nourished.  Neck: Neck supple. No  thyromegaly present.  Cardiovascular: Normal rate and regular rhythm.  Exam reveals no gallop.   Pulmonary/Chest: Effort normal and breath sounds normal. No respiratory distress. She has no wheezes. She has no rales.  Musculoskeletal: She exhibits no edema.  Right knee reveals no effusion. She has no specific areas of point tenderness. Possibly mild warmth compared to left. No ecchymosis. No significant erythema. No rashes.  Skin: No rash noted.          Assessment & Plan:  #1 recent dyspnea with exertion. EKG today reveals no acute findings. Heart and lung exam are unremarkable. Obtain BNP, d-dimer, chest x-ray. Consider consultation with cardiology. Pulse oximetry today at rest is 98% #2 right knee pain. No effusion. Nonfocal exam. Obtain knee x-ray when getting chest x-ray tomorrow

## 2013-06-28 NOTE — Patient Instructions (Addendum)
Avoid any heavy exertion for now. Follow up promptly for any fever, increased shortness or breath, or any progressive chest pain.

## 2013-06-28 NOTE — Telephone Encounter (Signed)
New Problem  Pt states she is having chest pains// ribcage area towards shoulders.Marland Kitchen abnormal pain/// papillations are frequent// medications not working.

## 2013-06-29 ENCOUNTER — Telehealth: Payer: Self-pay | Admitting: Family Medicine

## 2013-06-29 ENCOUNTER — Other Ambulatory Visit: Payer: Self-pay | Admitting: Family Medicine

## 2013-06-29 ENCOUNTER — Telehealth: Payer: Self-pay | Admitting: *Deleted

## 2013-06-29 ENCOUNTER — Ambulatory Visit (INDEPENDENT_AMBULATORY_CARE_PROVIDER_SITE_OTHER)
Admission: RE | Admit: 2013-06-29 | Discharge: 2013-06-29 | Disposition: A | Discharge status: Home / Self Care | Payer: BC Managed Care – PPO | Source: Ambulatory Visit | Attending: Family Medicine | Admitting: Family Medicine

## 2013-06-29 DIAGNOSIS — M25562 Pain in left knee: Secondary | ICD-10-CM

## 2013-06-29 DIAGNOSIS — M25561 Pain in right knee: Secondary | ICD-10-CM

## 2013-06-29 DIAGNOSIS — M25569 Pain in unspecified knee: Secondary | ICD-10-CM

## 2013-06-29 DIAGNOSIS — R0609 Other forms of dyspnea: Secondary | ICD-10-CM

## 2013-06-29 DIAGNOSIS — R0989 Other specified symptoms and signs involving the circulatory and respiratory systems: Secondary | ICD-10-CM

## 2013-06-29 DIAGNOSIS — R0600 Dyspnea, unspecified: Secondary | ICD-10-CM

## 2013-06-29 NOTE — Telephone Encounter (Signed)
noted 

## 2013-06-29 NOTE — Telephone Encounter (Signed)
[address] [place], [place] [address] p. [phone] f. [phone] To: [facility] (After Hours Triage) Fax: [phone] From: [facility] Date/ Time: 06/28/2013 8:46 PM Taken By: Eduardo Osier, CSR Caller: Shanda Bumps Facility: [facility] Patient: Caroline Boone DOB: 05-15-1966 Phone: [phone] Reason for Call: Shanda Bumps is calling from [employer] regarding a D-Dimer ordered on Caroline Boone by Bruce Burchette9/07/2013 5:22:00 PM. The results were 0.27. Regarding Appointment:

## 2013-06-29 NOTE — Telephone Encounter (Signed)
[facility] Fax: [phone] From: [facility] Date/ Time: 06/28/2013 7:14 PM Taken By: Ulice Bold, CSR Caller: Shanda Bumps Facility: [facility] Patient: Johnney Killian DOB: 1966-08-24 Phone: [phone] Reason for Call: Shanda Bumps is calling from [facility] regarding a BNP ordered on Sonnie Alamo, Melvyn Neth by Burchette9/07/2013 5:39:00 PM. The results were 11.4 Normal rang. Regarding Appointment: Appt Date: Appt Time: Unknown Provider: Reason: Details:

## 2013-07-13 ENCOUNTER — Other Ambulatory Visit: Payer: Self-pay | Admitting: Family Medicine

## 2013-07-13 NOTE — Telephone Encounter (Signed)
Last refill 06/01/13 #90 0 refill

## 2013-07-14 NOTE — Telephone Encounter (Signed)
Call in #[phone] with 5 rf 

## 2013-07-24 ENCOUNTER — Telehealth: Payer: Self-pay | Admitting: Family Medicine

## 2013-07-24 NOTE — Telephone Encounter (Signed)
Pt request refill HYDROcodone-acetaminophen (NORCO/VICODIN) 5-325 MG per tablet °

## 2013-07-25 MED ORDER — HYDROCODONE-ACETAMINOPHEN 5-325 MG PO TABS: 1.0000 | ORAL_TABLET | Freq: Four times a day (QID) | ORAL | Status: DC | PRN

## 2013-07-25 NOTE — Telephone Encounter (Signed)
done

## 2013-07-26 NOTE — Telephone Encounter (Signed)
Script is ready for pick up and I spoke with pt.  

## 2013-08-31 ENCOUNTER — Encounter: Payer: Self-pay | Admitting: Internal Medicine

## 2013-08-31 ENCOUNTER — Ambulatory Visit (INDEPENDENT_AMBULATORY_CARE_PROVIDER_SITE_OTHER): Payer: BC Managed Care – PPO | Admitting: Internal Medicine

## 2013-08-31 VITALS — BP 102/76 | Temp 98.0°F | Wt 152.0 lb

## 2013-08-31 DIAGNOSIS — H6120 Impacted cerumen, unspecified ear: Secondary | ICD-10-CM

## 2013-08-31 DIAGNOSIS — H6121 Impacted cerumen, right ear: Secondary | ICD-10-CM

## 2013-08-31 NOTE — Progress Notes (Signed)
Pre visit review using our clinic review tool, if applicable. No additional management support is needed unless otherwise documented below in the visit note. 

## 2013-08-31 NOTE — Patient Instructions (Signed)
Call or return to clinic prn if these symptoms worsen or fail to improve as anticipated.

## 2013-08-31 NOTE — Progress Notes (Signed)
Subjective:    Patient ID: Caroline Boone, female    DOB: Oct 21, 1965, 47 y.o.   MRN: [id number]  HPI Pre-visit discussion using our clinic review tool. No additional management support is needed unless otherwise documented below in the visit note.  47 year old patient who presents with a five-day history of diminished auditory acuity from the right ear. For the past day or 2 she hasn't noticed some slight increase in discomfort. No fever or drainage from the ear Past Medical History  Diagnosis Date  . Palpitations     hx  . Hypertension     a. echo 7/11: mod LVH, EF 55-65%  . GERD (gastroesophageal reflux disease)   . Insomnia   . Anxiety   . Headache(784.0)   . History of cardiac cath 2007    cath 11/25/05 by Dr. Jenne Campus with normal cors and EF 50%  . Chest pain     GXT echo 8/11: normal LVF, no ischemia;  h/o neg. myoview in 2005  . Carotid stenosis     Dopplers 6/12:0-39% bilateral; distal ICAs with marked tortuosity; CT angiogram recommended;  followed at [facility]; surgery to correct LICA stenosis and pseudoaneurysm too risky - > medical Rx;  dopplers 8/13: B/L 0-39% (stable)  . 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 Prescott Parma  at [facility]  . Fibromuscular dysplasia     affects left vertebral,  artery, left ICA, left renal artery;  head and neck CTA 10/24/11: stable pattern of fibromuscular dysplasia of ICA and VA with distal changes suggesting prior pseudoaneurysm and dissection; L dist ICA 60%; normal MRI 10/29/11;  followed at [facility] with serial CT scans  . Cerebral aneurysm   . Carotid artery injury     History   Social History  . Marital Status: Divorced    Spouse Name: N/A    Number of Children: N/A  . Years of Education: N/A   Occupational History  . Not on file.   Social History Main Topics  . Smoking status: Never Smoker   . Smokeless tobacco: Never Used  . Alcohol Use: No  . Drug Use: No  . Sexual Activity:  Not on file   Other Topics Concern  . Not on file   Social History Narrative  . No narrative on file    Past Surgical History  Procedure Laterality Date  . Breast enhancement surgery  1991    Bilateral breast     Family History  Problem Relation Age of Onset  . Arthritis      family hx  . Breast cancer      relative ,50  . Diabetes      family hx  . Hypertension      family hx  . Kidney disease      family hx  . Lung cancer      family hx  . Coronary artery disease Mother   . Kidney disease Mother   . Heart disease Mother   . Heart attack Father 7    CABG  . Kidney failure Father   . Hypertension Father   . Heart disease Father     Allergies  Allergen Reactions  . Amoxicillin Nausea And Vomiting  . Sulfonamide Derivatives Hives    Current Outpatient Prescriptions on File Prior to Visit  Medication Sig Dispense Refill  . ALPRAZolam (XANAX) 1 MG tablet take 1 tablet by mouth three times a day AS NEEDED FOR ANXIETY.  90 tablet  5  . aspirin 81 MG tablet Take 81 mg by mouth daily.        . cloNIDine (CATAPRES) 0.1 MG tablet Take 0.1 mg by mouth as needed.      . cyclobenzaprine (FLEXERIL) 10 MG tablet take 1 tablet by mouth three times a day if needed for muscle spasm  90 tablet  3  . esomeprazole (NEXIUM) 40 MG capsule Take 1 capsule (40 mg total) by mouth daily before breakfast.  30 capsule  5  . hydrochlorothiazide (HYDRODIURIL) 25 MG tablet take 1 tablet by mouth once daily  30 tablet  2  . HYDROcodone-acetaminophen (NORCO/VICODIN) 5-325 MG per tablet Take 1 tablet by mouth every 6 (six) hours as needed for pain.  60 tablet  0  . lisinopril (PRINIVIL,ZESTRIL) 20 MG tablet Take 20 mg by mouth. Take 1/2 tablet at night and if B/P is elevated then take another 1/2 tablet      . metoprolol tartrate (LOPRESSOR) 25 MG tablet Take 25 mg by mouth every 6 (six) hours as needed. For accelerated heart rate       . promethazine (PHENERGAN) 25 MG tablet Take 1 tablet (25  mg total) by mouth 4 (four) times daily as needed. For nausea  30 tablet  5  . rizatriptan (MAXALT) 10 MG tablet Take 1 tablet (10 mg total) by mouth as needed for migraine. May repeat in 2 hours if needed  6 tablet  11  . zolpidem (AMBIEN) 10 MG tablet take 1 tablet by mouth at bedtime  30 tablet  5   No current facility-administered medications on file prior to visit.    BP 102/76  Temp(Src) 98 F (36.7 C) (Oral)  Wt 152 lb (68.947 kg)      Review of Systems  HENT: Positive for ear pain and hearing loss.        Objective:   Physical Exam  Constitutional: She appears well-developed and well-nourished. No distress.  HENT:  Mouth/Throat: Oropharynx is clear and moist.  Left canal and tympanic membrane normal Right canal occluded with cerumen          Assessment & Plan:   Cerumen impaction right ear. The canal irrigated until clear

## 2013-10-31 ENCOUNTER — Encounter: Payer: Self-pay | Admitting: Family Medicine

## 2013-10-31 ENCOUNTER — Ambulatory Visit (INDEPENDENT_AMBULATORY_CARE_PROVIDER_SITE_OTHER): Payer: BC Managed Care – PPO | Admitting: Family Medicine

## 2013-10-31 VITALS — BP 102/78 | HR 105 | Temp 102.4°F | Wt 148.0 lb

## 2013-10-31 DIAGNOSIS — N390 Urinary tract infection, site not specified: Secondary | ICD-10-CM

## 2013-10-31 NOTE — Progress Notes (Signed)
Pre visit review using our clinic review tool, if applicable. No additional management support is needed unless otherwise documented below in the visit note. 

## 2013-11-01 ENCOUNTER — Inpatient Hospital Stay (HOSPITAL_COMMUNITY): Payer: BC Managed Care – PPO

## 2013-11-01 ENCOUNTER — Encounter (HOSPITAL_COMMUNITY): Payer: Self-pay | Admitting: Emergency Medicine

## 2013-11-01 ENCOUNTER — Encounter: Payer: Self-pay | Admitting: Family Medicine

## 2013-11-01 ENCOUNTER — Inpatient Hospital Stay (HOSPITAL_COMMUNITY)
Admission: EM | Admit: 2013-11-01 | Discharge: 2013-11-04 | DRG: 690 | Disposition: A | Discharge status: Home / Self Care | Payer: BC Managed Care – PPO | Attending: Internal Medicine | Admitting: Internal Medicine

## 2013-11-01 DIAGNOSIS — R5383 Other fatigue: Secondary | ICD-10-CM

## 2013-11-01 DIAGNOSIS — R002 Palpitations: Secondary | ICD-10-CM

## 2013-11-01 DIAGNOSIS — Z841 Family history of disorders of kidney and ureter: Secondary | ICD-10-CM

## 2013-11-01 DIAGNOSIS — R0602 Shortness of breath: Secondary | ICD-10-CM

## 2013-11-01 DIAGNOSIS — N12 Tubulo-interstitial nephritis, not specified as acute or chronic: Principal | ICD-10-CM

## 2013-11-01 DIAGNOSIS — G5760 Lesion of plantar nerve, unspecified lower limb: Secondary | ICD-10-CM

## 2013-11-01 DIAGNOSIS — K5289 Other specified noninfective gastroenteritis and colitis: Secondary | ICD-10-CM

## 2013-11-01 DIAGNOSIS — R079 Chest pain, unspecified: Secondary | ICD-10-CM

## 2013-11-01 DIAGNOSIS — E785 Hyperlipidemia, unspecified: Secondary | ICD-10-CM

## 2013-11-01 DIAGNOSIS — I773 Arterial fibromuscular dysplasia: Secondary | ICD-10-CM

## 2013-11-01 DIAGNOSIS — J069 Acute upper respiratory infection, unspecified: Secondary | ICD-10-CM

## 2013-11-01 DIAGNOSIS — R112 Nausea with vomiting, unspecified: Secondary | ICD-10-CM

## 2013-11-01 DIAGNOSIS — R51 Headache: Secondary | ICD-10-CM

## 2013-11-01 DIAGNOSIS — I7789 Other specified disorders of arteries and arterioles: Secondary | ICD-10-CM | POA: Diagnosis present

## 2013-11-01 DIAGNOSIS — M129 Arthropathy, unspecified: Secondary | ICD-10-CM

## 2013-11-01 DIAGNOSIS — R0989 Other specified symptoms and signs involving the circulatory and respiratory systems: Secondary | ICD-10-CM

## 2013-11-01 DIAGNOSIS — Z8249 Family history of ischemic heart disease and other diseases of the circulatory system: Secondary | ICD-10-CM

## 2013-11-01 DIAGNOSIS — Z7982 Long term (current) use of aspirin: Secondary | ICD-10-CM

## 2013-11-01 DIAGNOSIS — I10 Essential (primary) hypertension: Secondary | ICD-10-CM

## 2013-11-01 DIAGNOSIS — Z882 Allergy status to sulfonamides status: Secondary | ICD-10-CM

## 2013-11-01 DIAGNOSIS — Z881 Allergy status to other antibiotic agents status: Secondary | ICD-10-CM

## 2013-11-01 DIAGNOSIS — K219 Gastro-esophageal reflux disease without esophagitis: Secondary | ICD-10-CM

## 2013-11-01 DIAGNOSIS — J019 Acute sinusitis, unspecified: Secondary | ICD-10-CM

## 2013-11-01 DIAGNOSIS — Z833 Family history of diabetes mellitus: Secondary | ICD-10-CM

## 2013-11-01 DIAGNOSIS — Z803 Family history of malignant neoplasm of breast: Secondary | ICD-10-CM

## 2013-11-01 DIAGNOSIS — G4700 Insomnia, unspecified: Secondary | ICD-10-CM

## 2013-11-01 DIAGNOSIS — F411 Generalized anxiety disorder: Secondary | ICD-10-CM

## 2013-11-01 DIAGNOSIS — Z79899 Other long term (current) drug therapy: Secondary | ICD-10-CM

## 2013-11-01 DIAGNOSIS — Z8261 Family history of arthritis: Secondary | ICD-10-CM

## 2013-11-01 LAB — CBC WITH DIFFERENTIAL/PLATELET
BASOS ABS: 0 10*3/uL (ref 0.0–0.1)
BASOS PCT: 0 % (ref 0–1)
EOS ABS: 0.1 10*3/uL (ref 0.0–0.7)
EOS PCT: 0 % (ref 0–5)
HCT: 41.6 % (ref 36.0–46.0)
Hemoglobin: 14.2 g/dL (ref 12.0–15.0)
Lymphocytes Relative: 11 % — ABNORMAL LOW (ref 12–46)
Lymphs Abs: 1.7 10*3/uL (ref 0.7–4.0)
MCH: 31.9 pg (ref 26.0–34.0)
MCHC: 34.1 g/dL (ref 30.0–36.0)
MCV: 93.5 fL (ref 78.0–100.0)
Monocytes Absolute: 1.5 10*3/uL — ABNORMAL HIGH (ref 0.1–1.0)
Monocytes Relative: 10 % (ref 3–12)
Neutro Abs: 11.6 10*3/uL — ABNORMAL HIGH (ref 1.7–7.7)
Neutrophils Relative %: 78 % — ABNORMAL HIGH (ref 43–77)
PLATELETS: 276 10*3/uL (ref 150–400)
RBC: 4.45 MIL/uL (ref 3.87–5.11)
RDW: 12.5 % (ref 11.5–15.5)
WBC: 14.9 10*3/uL — AB (ref 4.0–10.5)

## 2013-11-01 LAB — URINE MICROSCOPIC-ADD ON

## 2013-11-01 LAB — URINALYSIS, ROUTINE W REFLEX MICROSCOPIC
Glucose, UA: NEGATIVE mg/dL
HGB URINE DIPSTICK: NEGATIVE
Ketones, ur: >80 mg/dL — AB
NITRITE: NEGATIVE
PH: 5.5 (ref 5.0–8.0)
Protein, ur: NEGATIVE mg/dL
SPECIFIC GRAVITY, URINE: 1.021 (ref 1.005–1.030)
Urobilinogen, UA: 0.2 mg/dL (ref 0.0–1.0)

## 2013-11-01 LAB — COMPREHENSIVE METABOLIC PANEL
ALT: 10 U/L (ref 0–35)
AST: 13 U/L (ref 0–37)
Albumin: 3.8 g/dL (ref 3.5–5.2)
Alkaline Phosphatase: 85 U/L (ref 39–117)
BUN: 9 mg/dL (ref 6–23)
CALCIUM: 9.6 mg/dL (ref 8.4–10.5)
CO2: 26 mEq/L (ref 19–32)
Chloride: 97 mEq/L (ref 96–112)
Creatinine, Ser: 0.68 mg/dL (ref 0.50–1.10)
GFR calc Af Amer: >90 mL/min (ref >90)
GFR calc non Af Amer: >90 mL/min (ref >90)
Glucose, Bld: 96 mg/dL (ref 70–99)
Potassium: 4.4 mEq/L (ref 3.7–5.3)
SODIUM: 138 meq/L (ref 137–147)
TOTAL PROTEIN: 7.7 g/dL (ref 6.0–8.3)
Total Bilirubin: 0.6 mg/dL (ref 0.3–1.2)

## 2013-11-01 LAB — PREGNANCY, URINE: Preg Test, Ur: NEGATIVE

## 2013-11-01 LAB — CBC
HCT: 36.5 % (ref 36.0–46.0)
Hemoglobin: 12.4 g/dL (ref 12.0–15.0)
MCH: 32 pg (ref 26.0–34.0)
MCHC: 34 g/dL (ref 30.0–36.0)
MCV: 94.1 fL (ref 78.0–100.0)
Platelets: 228 10*3/uL (ref 150–400)
RBC: 3.88 MIL/uL (ref 3.87–5.11)
RDW: 12.5 % (ref 11.5–15.5)
WBC: 14.9 10*3/uL — ABNORMAL HIGH (ref 4.0–10.5)

## 2013-11-01 MED ORDER — MORPHINE SULFATE 4 MG/ML IJ SOLN
4.0000 mg | Freq: Once | INTRAMUSCULAR | Status: AC
  Administered 2013-11-01: 4 mg via INTRAVENOUS
  Filled 2013-11-01: qty 1

## 2013-11-01 MED ORDER — ASPIRIN EC 81 MG PO TBEC
81.0000 mg | DELAYED_RELEASE_TABLET | Freq: Every day | ORAL | Status: DC
  Administered 2013-11-02 – 2013-11-04 (×3): 81 mg via ORAL
  Filled 2013-11-01 (×3): qty 1

## 2013-11-01 MED ORDER — ACETAMINOPHEN 325 MG PO TABS: 650.0000 mg | ORAL_TABLET | Freq: Four times a day (QID) | ORAL | Status: DC | PRN

## 2013-11-01 MED ORDER — METOPROLOL SUCCINATE ER 25 MG PO TB24
25.0000 mg | ORAL_TABLET | Freq: Every morning | ORAL | Status: DC
  Administered 2013-11-02 – 2013-11-04 (×3): 25 mg via ORAL
  Filled 2013-11-01 (×3): qty 1

## 2013-11-01 MED ORDER — HYDROMORPHONE HCL PF 1 MG/ML IJ SOLN
1.0000 mg | INTRAMUSCULAR | Status: DC | PRN
  Administered 2013-11-01: 1 mg via INTRAVENOUS
  Filled 2013-11-01: qty 1

## 2013-11-01 MED ORDER — SODIUM CHLORIDE 0.9 % IV SOLN
INTRAVENOUS | Status: DC
  Administered 2013-11-01 – 2013-11-04 (×7): via INTRAVENOUS

## 2013-11-01 MED ORDER — ACETAMINOPHEN 500 MG PO TABS: 1000.0000 mg | ORAL_TABLET | Freq: Four times a day (QID) | ORAL | Status: DC | PRN

## 2013-11-01 MED ORDER — HYDROCHLOROTHIAZIDE 25 MG PO TABS
25.0000 mg | ORAL_TABLET | Freq: Every day | ORAL | Status: DC
  Administered 2013-11-02 – 2013-11-04 (×3): 25 mg via ORAL
  Filled 2013-11-01 (×3): qty 1

## 2013-11-01 MED ORDER — ONDANSETRON HCL 4 MG/2ML IJ SOLN
4.0000 mg | Freq: Four times a day (QID) | INTRAMUSCULAR | Status: DC | PRN
  Administered 2013-11-02 – 2013-11-03 (×4): 4 mg via INTRAVENOUS
  Filled 2013-11-01 (×5): qty 2

## 2013-11-01 MED ORDER — ZOLPIDEM TARTRATE 5 MG PO TABS: 5.0000 mg | ORAL_TABLET | Freq: Every evening | ORAL | Status: DC | PRN

## 2013-11-01 MED ORDER — LISINOPRIL 20 MG PO TABS
20.0000 mg | ORAL_TABLET | Freq: Every day | ORAL | Status: DC
Start: 2013-11-01 — End: 2013-11-04
  Administered 2013-11-01 – 2013-11-03 (×3): 20 mg via ORAL
  Filled 2013-11-01 (×4): qty 1

## 2013-11-01 MED ORDER — HYDROCODONE-ACETAMINOPHEN 5-325 MG PO TABS
1.0000 | ORAL_TABLET | Freq: Four times a day (QID) | ORAL | Status: DC | PRN
  Administered 2013-11-02 – 2013-11-04 (×7): 1 via ORAL
  Filled 2013-11-01 (×7): qty 1

## 2013-11-01 MED ORDER — MORPHINE SULFATE 2 MG/ML IJ SOLN: 2.0000 mg | INTRAMUSCULAR | Status: DC | PRN

## 2013-11-01 MED ORDER — CLONIDINE HCL 0.1 MG PO TABS: 0.1000 mg | ORAL_TABLET | ORAL | Status: DC | PRN

## 2013-11-01 MED ORDER — DEXTROSE 5 % IV SOLN
1.0000 g | INTRAVENOUS | Status: DC
  Administered 2013-11-01: 1 g via INTRAVENOUS
  Filled 2013-11-01: qty 10

## 2013-11-01 MED ORDER — DEXTROSE 5 % IV SOLN
1.0000 g | Freq: Three times a day (TID) | INTRAVENOUS | Status: DC
  Administered 2013-11-01 – 2013-11-02 (×2): 1 g via INTRAVENOUS
  Filled 2013-11-01 (×4): qty 1

## 2013-11-01 MED ORDER — ALPRAZOLAM 0.5 MG PO TABS: 1.0000 mg | ORAL_TABLET | Freq: Three times a day (TID) | ORAL | Status: DC | PRN

## 2013-11-01 MED ORDER — ONDANSETRON HCL 4 MG/2ML IJ SOLN
4.0000 mg | Freq: Once | INTRAMUSCULAR | Status: AC
  Administered 2013-11-01: 4 mg via INTRAVENOUS
  Filled 2013-11-01: qty 2

## 2013-11-01 MED ORDER — ONDANSETRON HCL 4 MG PO TABS: 4.0000 mg | ORAL_TABLET | Freq: Four times a day (QID) | ORAL | Status: DC | PRN

## 2013-11-01 MED ORDER — CYCLOBENZAPRINE HCL 10 MG PO TABS
10.0000 mg | ORAL_TABLET | Freq: Three times a day (TID) | ORAL | Status: DC | PRN
  Administered 2013-11-03: 10 mg via ORAL
  Filled 2013-11-01: qty 1

## 2013-11-01 MED ORDER — ENOXAPARIN SODIUM 40 MG/0.4ML ~~LOC~~ SOLN
40.0000 mg | Freq: Every day | SUBCUTANEOUS | Status: DC
  Administered 2013-11-01 – 2013-11-03 (×3): 40 mg via SUBCUTANEOUS
  Filled 2013-11-01 (×4): qty 0.4

## 2013-11-01 MED ORDER — SODIUM CHLORIDE 0.9 % IV BOLUS (SEPSIS)
1000.0000 mL | Freq: Once | INTRAVENOUS | Status: AC
  Administered 2013-11-01: 1000 mL via INTRAVENOUS

## 2013-11-01 MED ORDER — ACETAMINOPHEN 650 MG RE SUPP: 650.0000 mg | Freq: Four times a day (QID) | RECTAL | Status: DC | PRN

## 2013-11-01 NOTE — Progress Notes (Signed)
   Subjective:    Patient ID: Caroline Boone, female    DOB: June 02, 1966, 48 y.o.   MRN: [id number]  HPI Here to recheck back pain and fever. She started having some dull right sided lower back pains about 2 weeks ago but had no urinary urgency or burning. No NVD. Then yesterday she developed some fever for the first time. She denies any HA or ST or cough. She works at [employer] and she ran a UA on herself today which showed 1+ leukocytes, negative nitrates, no blood, 1+ bacteria , and 16-20 WBC. She spoke to Dr. Tiburcio Basheiterding in the hallway and he gave her a rx for Keflex for a presumed UTI. She has not filled this yet. She is here it seems to get a second opinion as to whether she has the flu or not. Dr. Deiterding did order a culture on the sample.    Review of Systems  Constitutional: Positive for fever.  HENT: Negative.   Eyes: Negative.   Respiratory: Negative.   Cardiovascular: Negative.   Gastrointestinal: Negative.   Genitourinary: Negative for dysuria, urgency, frequency, hematuria, flank pain, difficulty urinating and pelvic pain.       Objective:   Physical Exam  Constitutional: She appears well-developed and well-nourished.  HENT:  Right Ear: External ear normal.  Left Ear: External ear normal.  Nose: Nose normal.  Mouth/Throat: Oropharynx is clear and moist.  Eyes: Conjunctivae are normal.  Neck: Neck supple. No thyromegaly present.  Cardiovascular: Normal rate, regular rhythm, normal heart sounds and intact distal pulses.   Pulmonary/Chest: Effort normal and breath sounds normal.  Abdominal: Soft. Bowel sounds are normal. She exhibits no distension and no mass. There is no tenderness. There is no rebound and no guarding.  No CVAT  Lymphadenopathy:    She has no cervical adenopathy.          Assessment & Plan:  A rapid influenza test here was negative. She seems to have a UTI, probably a pyelonephritis. I advised her to start on the Keflex. Drink lots of  fluids. She will follow up the culture results

## 2013-11-01 NOTE — H&P (Signed)
[facility]                                                                                    Patient Demographics  Caroline Boone, is a 48 y.o. female  CSN: [id number]  MRN: [id number]  DOB - [birth year]  Admit Date - 11/01/2013  Outpatient Primary MD for the patient is Nelwyn Salisbury, MD   With History of -  Past Medical History  Diagnosis Date  . Palpitations     hx  . Hypertension     a. echo 7/11: mod LVH, EF 55-65%  . GERD (gastroesophageal reflux disease)   . Insomnia   . Anxiety   . Headache(784.0)   . History of cardiac cath 2007    cath 11/25/05 by Dr. Jenne Campus with normal cors and EF 50%  . Chest pain     GXT echo 8/11: normal LVF, no ischemia;  h/o neg. myoview in 2005  . Carotid stenosis     Dopplers 6/12:0-39% bilateral; distal ICAs with marked tortuosity; CT angiogram recommended;  followed at [facility]; surgery to correct LICA stenosis and pseudoaneurysm too risky - > medical Rx;  dopplers 8/13: B/L 0-39% (stable)  . 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 Prescott Parma  at [facility]  . Fibromuscular dysplasia     affects left vertebral,  artery, left ICA, left renal artery;  head and neck CTA 10/24/11: stable pattern of fibromuscular dysplasia of ICA and VA with distal changes suggesting prior pseudoaneurysm and dissection; L dist ICA 60%; normal MRI 10/29/11;  followed at [facility] with serial CT scans  . Cerebral aneurysm   . Carotid artery injury       Past Surgical History  Procedure Laterality Date  . Breast enhancement surgery  1991    Bilateral breast     in for   Chief Complaint  Patient presents with  . Flank Pain  . Fever     HPI  Caroline Boone  is a 48 y.o. female, with a history of FMD and left carotid dissection, presenting with back pain for a few weeks duration however started having fever yesterday and was started on by mouth antibiotics for UTI by nephrology. Cultures were done  yesterday. Patient reports episodes of nausea vomiting in addition to the fever , mild dysuria but no blood in her urine. CT of the abdomen in the emergency room came back negative and I was called to admit for pyelonephritis.    Review of Systems    In addition to the HPI above,  No Headache, No changes with Vision or hearing, No problems swallowing food or Liquids, No Chest pain, Cough or Shortness of Breath, No Abdominal pain, Bowel movements are regular, No Blood in stool or Urine, No dysuria, No new skin rashes or bruises, No new joints pains-aches,  No new weakness, tingling, numbness in any extremity, No recent weight gain or loss, No polyuria, polydypsia or polyphagia, No significant Mental Stressors.  A full 10 point Review of Systems was done, except as stated above, all other Review of Systems were negative.   Social History  History  Substance Use Topics  . Smoking status: Never Smoker   . Smokeless tobacco: Never Used  . Alcohol Use: No     Family History Family History  Problem Relation Age of Onset  . Arthritis      family hx  . Breast cancer      relative ,50  . Diabetes      family hx  . Hypertension      family hx  . Kidney disease      family hx  . Lung cancer      family hx  . Coronary artery disease Mother   . Kidney disease Mother   . Heart disease Mother   . Heart attack Father [id number]    CABG  . Kidney failure Father   . Hypertension Father   . Heart disease Father      Prior to Admission medications   Medication Sig Start Date End Date Taking? Authorizing Provider  acetaminophen (TYLENOL) 500 MG tablet Take 1,000 mg by mouth every 6 (six) hours as needed for moderate pain.   Yes Historical Provider, MD  ALPRAZolam Prudy Feeler(XANAX) 1 MG tablet Take 1 mg by mouth 3 (three) times daily as needed for anxiety.   Yes Historical Provider, MD  aspirin 81 MG tablet Take 81 mg by mouth daily.     Yes Historical Provider, MD  cephALEXin (KEFLEX) 500 MG  capsule Take 500 mg by mouth 2 (two) times daily. Started 10/31/13, for 10 days, ending 11/09/13.   Yes Historical Provider, MD  cloNIDine (CATAPRES) 0.1 MG tablet Take 0.1 mg by mouth as needed (for high BP; takes along with lisinopril as needed).    Yes Historical Provider, MD  cyclobenzaprine (FLEXERIL) 10 MG tablet take 1 tablet by mouth three times a day if needed for muscle spasm 06/29/13  Yes Nelwyn SalisburyStephen A Fry, MD  hydrochlorothiazide (HYDRODIURIL) 25 MG tablet take 1 tablet by mouth once daily 11/15/12  Yes Beatrice LecherScott T Weaver, PA-C  HYDROcodone-acetaminophen (NORCO/VICODIN) 5-325 MG per tablet Take 1 tablet by mouth every 6 (six) hours as needed for pain. 07/25/13  Yes Nelwyn SalisburyStephen A Fry, MD  lisinopril (PRINIVIL,ZESTRIL) 20 MG tablet Take 20-40 mg by mouth daily as needed (takes 20mg  at bedtime everyday, then another 20mg  as needed for high BP).    Yes Historical Provider, MD  metoprolol succinate (TOPROL-XL) 25 MG 24 hr tablet Take 25 mg by mouth every morning.    Yes Historical Provider, MD  zolpidem (AMBIEN) 10 MG tablet Take 10 mg by mouth at bedtime as needed for sleep.   Yes Historical Provider, MD    Allergies  Allergen Reactions  . Amoxicillin Nausea And Vomiting  . Sulfonamide Derivatives Hives    Physical Exam  Vitals  Blood pressure 113/64, pulse 105, temperature 99.9 F (37.7 C), temperature source Oral, resp. rate 21, height 5\' 2"  (1.575 m), weight 66.679 kg (147 lb), SpO2 98.00%.   1. General a very pleasant white American female in no significant distress  2. Normal affect and insight, Not Suicidal or Homicidal, Awake Alert, Oriented X 3.  3. No F.N deficits, ALL C.Nerves Intact, Strength 5/5 all 4 extremities, Sensation intact all 4 extremities, Plantars down going.  4. Ears and Eyes appear Normal, Conjunctivae clear, PERRLA. Moist Oral Mucosa.  5. Supple Neck, No JVD, No cervical lymphadenopathy appriciated, No Carotid Bruits.  6. Symmetrical Chest wall movement, Good air  movement bilaterally, CTAB.  7. RRR, No Gallops, Rubs or Murmurs, No Parasternal Heave.  8. Positive Bowel Sounds, Abdomen Soft, Non tender, No organomegaly appriciated,No rebound -guarding or rigidity.  9.  No Cyanosis, Normal Skin Turgor, No Skin Rash or Bruise.  10. Good muscle tone,  joints appear normal , no effusions, Normal ROM.  11. No Palpable Lymph Nodes in Neck or Axillae    Data Review  CBC  Recent Labs Lab 11/01/13 1511  WBC 14.9*  HGB 14.2  HCT 41.6  PLT 276  MCV 93.5  MCH 31.9  MCHC 34.1  RDW 12.5  LYMPHSABS 1.7  MONOABS 1.5*  EOSABS 0.1  BASOSABS 0.0   ------------------------------------------------------------------------------------------------------------------  Chemistries   Recent Labs Lab 11/01/13 1511  NA 138  K 4.4  CL 97  CO2 26  GLUCOSE 96  BUN 9  CREATININE 0.68  CALCIUM 9.6  AST 13  ALT 10  ALKPHOS 85  BILITOT 0.6    Urinalysis    Component Value Date/Time   COLORURINE YELLOW 11/01/2013 1727   APPEARANCEUR HAZY* 11/01/2013 1727   LABSPEC 1.021 11/01/2013 1727   PHURINE 5.5 11/01/2013 1727   GLUCOSEU NEGATIVE 11/01/2013 1727   HGBUR NEGATIVE 11/01/2013 1727   BILIRUBINUR SMALL* 11/01/2013 1727   KETONESUR >80* 11/01/2013 1727   PROTEINUR NEGATIVE 11/01/2013 1727   UROBILINOGEN 0.2 11/01/2013 1727   NITRITE NEGATIVE 11/01/2013 1727   LEUKOCYTESUR SMALL* 11/01/2013 1727    ----------------------------------------------------------------------------------------------------------------   Imaging results:   Ct Abdomen Pelvis Wo Contrast  11/01/2013   CLINICAL DATA:  Fever, right flank pain.  EXAM: CT ABDOMEN AND PELVIS WITHOUT CONTRAST  TECHNIQUE: Multidetector CT imaging of the abdomen and pelvis was performed following the standard protocol without intravenous contrast.  COMPARISON:  Ultrasound 05/17/2012  FINDINGS: Visualized lung bases clear. Bilateral breast implants partially visualized. Unremarkable liver, spleen, adrenal  glands, kidneys. No nephrolithiasis or hydronephrosis. Unenhanced CT was performed per clinician order. Lack of IV contrast limits sensitivity and specificity, especially for evaluation of abdominal/pelvic solid viscera.  Gallbladder is distended. Unremarkable aorta. Stomach, small bowel, and colon are nondilated. Normal appendix. Urinary bladder incompletely distended. Uterus and adnexal regions unremarkable. Left pelvic phleboliths. No ascites. No free air. Mild thoracolumbar dextroscoliosis apex T12 without underlying vertebral anomaly.  IMPRESSION: 1. Distended gallbladder.  Correlate with NPO status. 2. Normal appendix. 3. No acute abdominal process.   Electronically Signed   By: Oley Balm M.D.   On: 11/01/2013 20:12    Assessment & Plan  1. Pyelonephritis 2. History of FMD 3. History of Left carotid dissection  Plan  IV Claforan IV fluids Check cultures     DVT Prophylaxis Lovenox  AM Labs Ordered, also please review Full Orders  Family Communication: Admission, patients condition and plan of care including tests being ordered have been discussed with the patient who indicates understanding and agrees with the plan and Code Status.  Code Status full  Disposition Plan: Home Time spent in minutes : 31 minutes  Condition GUARDED

## 2013-11-01 NOTE — ED Notes (Signed)
Pt states she has had fever and right sided flank pain x 2-3 days. 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 Keflex started yesterday. Took tylenol about 1 hour prior to arrival. Reports nausea, no vomiting.

## 2013-11-01 NOTE — ED Provider Notes (Signed)
CSN: [id number]     Arrival date & time 11/01/13  1455 History   First MD Initiated Contact with Patient 11/01/13 1627     Chief Complaint  Patient presents with  . Flank Pain  . Fever   (Consider location/radiation/quality/duration/timing/severity/associated sxs/prior Treatment) Patient is a 48 y.o. female presenting with flank pain and fever. The history is provided by the patient.  Flank Pain  Fever  patient here complaining of right-sided flank pain x3 days. Diagnosed with UTI yesterday by her Dr. and placed on Keflex. Notes fevers at home treated with Tylenol. Did have emesis times one here. Denies any hematuria. No diarrhea. Pain is been consistent and described as sharp. Nothing makes it better or worse. No prior history of same.  Past Medical History  Diagnosis Date  . Palpitations     hx  . Hypertension     a. echo 7/11: mod LVH, EF 55-65%  . GERD (gastroesophageal reflux disease)   . Insomnia   . Anxiety   . Headache(784.0)   . History of cardiac cath 2007    cath 11/25/05 by Dr. Jenne Campus with normal cors and EF 50%  . Chest pain     GXT echo 8/11: normal LVF, no ischemia;  h/o neg. myoview in 2005  . Carotid stenosis     Dopplers 6/12:0-39% bilateral; distal ICAs with marked tortuosity; CT angiogram recommended;  followed at [facility]; surgery to correct LICA stenosis and pseudoaneurysm too risky - > medical Rx;  dopplers 8/13: B/L 0-39% (stable)  . 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 Prescott Parma  at [facility]  . Fibromuscular dysplasia     affects left vertebral,  artery, left ICA, left renal artery;  head and neck CTA 10/24/11: stable pattern of fibromuscular dysplasia of ICA and VA with distal changes suggesting prior pseudoaneurysm and dissection; L dist ICA 60%; normal MRI 10/29/11;  followed at [facility] with serial CT scans  . Cerebral aneurysm   . Carotid artery injury    Past Surgical History  Procedure Laterality Date   . Breast enhancement surgery  1991    Bilateral breast    Family History  Problem Relation Age of Onset  . Arthritis      family hx  . Breast cancer      relative ,50  . Diabetes      family hx  . Hypertension      family hx  . Kidney disease      family hx  . Lung cancer      family hx  . Coronary artery disease Mother   . Kidney disease Mother   . Heart disease Mother   . Heart attack Father 61    CABG  . Kidney failure Father   . Hypertension Father   . Heart disease Father    History  Substance Use Topics  . Smoking status: Never Smoker   . Smokeless tobacco: Never Used  . Alcohol Use: No   OB History   Grav Para Term Preterm Abortions TAB SAB Ect Mult Living                 Review of Systems  Constitutional: Positive for fever.  Genitourinary: Positive for flank pain.  All other systems reviewed and are negative.    Allergies  Amoxicillin and Sulfonamide derivatives  Home Medications   Current Outpatient Rx  Name  Route  Sig  Dispense  Refill  .  ALPRAZolam (XANAX) 1 MG tablet      take 1 tablet by mouth three times a day AS NEEDED FOR ANXIETY.   90 tablet   5   . ALPRAZolam (XANAX) 1 MG tablet   Oral   Take 1 mg by mouth 3 (three) times daily as needed for anxiety.         Marland Kitchen. aspirin 81 MG tablet   Oral   Take 81 mg by mouth daily.           . cephALEXin (KEFLEX) 500 MG capsule   Oral   Take 500 mg by mouth 2 (two) times daily.         . cloNIDine (CATAPRES) 0.1 MG tablet   Oral   Take 0.1 mg by mouth as needed (for high BP; takes along with lisinopril as needed).          . cyclobenzaprine (FLEXERIL) 10 MG tablet      take 1 tablet by mouth three times a day if needed for muscle spasm   90 tablet   3   . hydrochlorothiazide (HYDRODIURIL) 25 MG tablet      take 1 tablet by mouth once daily   30 tablet   2   . HYDROcodone-acetaminophen (NORCO/VICODIN) 5-325 MG per tablet   Oral   Take 1 tablet by mouth every 6 (six)  hours as needed for pain.   60 tablet   0     May fill on 09-24-13   . lisinopril (PRINIVIL,ZESTRIL) 20 MG tablet   Oral   Take 20-40 mg by mouth daily as needed (takes 20mg  at bedtime everyday, then another 20mg  as needed for high BP). Take 1/2 tablet at night and if B/P is elevated then take another 1/2 tablet         . metoprolol succinate (TOPROL-XL) 25 MG 24 hr tablet   Oral   Take 25 mg by mouth every morning.          . zolpidem (AMBIEN) 10 MG tablet      take 1 tablet by mouth at bedtime   30 tablet   5   . promethazine (PHENERGAN) 25 MG tablet   Oral   Take 1 tablet (25 mg total) by mouth 4 (four) times daily as needed. For nausea   30 tablet   5   . rizatriptan (MAXALT) 10 MG tablet   Oral   Take 1 tablet (10 mg total) by mouth as needed for migraine. May repeat in 2 hours if needed   6 tablet   11    BP 114/69  Pulse 130  Temp(Src) 99.9 F (37.7 C) (Oral)  Resp 20  Ht 5\' 2"  (1.575 m)  Wt 147 lb (66.679 kg)  BMI 26.88 kg/m2  SpO2 98% Physical Exam  Nursing note and vitals reviewed. Constitutional: She is oriented to person, place, and time. She appears well-developed and well-nourished.  Non-toxic appearance. No distress.  HENT:  Head: Normocephalic and atraumatic.  Eyes: Conjunctivae, EOM and lids are normal. Pupils are equal, round, and reactive to light.  Neck: Normal range of motion. Neck supple. No tracheal deviation present. No mass present.  Cardiovascular: Regular rhythm and normal heart sounds.  Tachycardia present.  Exam reveals no gallop.   No murmur heard. Pulmonary/Chest: Effort normal and breath sounds normal. No stridor. No respiratory distress. She has no decreased breath sounds. She has no wheezes. She has no rhonchi. She has no rales.  Abdominal: Soft. Normal  appearance and bowel sounds are normal. She exhibits no distension. There is no tenderness. There is CVA tenderness. There is no rebound.  Musculoskeletal: Normal range of  motion. She exhibits no edema and no tenderness.  Neurological: She is alert and oriented to person, place, and time. She has normal strength. No cranial nerve deficit or sensory deficit. GCS eye subscore is 4. GCS verbal subscore is 5. GCS motor subscore is 6.  Skin: Skin is warm and dry. No abrasion and no rash noted.  Psychiatric: She has a normal mood and affect. Her speech is normal and behavior is normal.    ED Course  Procedures (including critical care time) Labs Review Labs Reviewed  CBC WITH DIFFERENTIAL - Abnormal; Notable for the following:    WBC 14.9 (*)    Neutrophils Relative % 78 (*)    Neutro Abs 11.6 (*)    Lymphocytes Relative 11 (*)    Monocytes Absolute 1.5 (*)    All other components within normal limits  URINE CULTURE  COMPREHENSIVE METABOLIC PANEL  URINALYSIS, ROUTINE W REFLEX MICROSCOPIC   Imaging Review No results found.  EKG Interpretation   None       MDM  No diagnosis found. Patient given pain medication and IV fluids here along with antibiotics. And anti-emetics. Patient will be admitted for treatment of her pyelonephritis    Toy Baker, MD 11/01/13 1902

## 2013-11-02 DIAGNOSIS — R51 Headache: Secondary | ICD-10-CM

## 2013-11-02 DIAGNOSIS — R5383 Other fatigue: Secondary | ICD-10-CM

## 2013-11-02 DIAGNOSIS — R5381 Other malaise: Secondary | ICD-10-CM

## 2013-11-02 LAB — URINE CULTURE
COLONY COUNT: NO GROWTH
CULTURE: NO GROWTH

## 2013-11-02 LAB — CREATININE, SERUM
Creatinine, Ser: 0.57 mg/dL (ref 0.50–1.10)
GFR calc Af Amer: >90 mL/min (ref >90)
GFR calc non Af Amer: >90 mL/min (ref >90)

## 2013-11-02 MED ORDER — CIPROFLOXACIN HCL 500 MG PO TABS
500.0000 mg | ORAL_TABLET | Freq: Two times a day (BID) | ORAL | Status: DC
  Administered 2013-11-02 – 2013-11-04 (×4): 500 mg via ORAL
  Filled 2013-11-02 (×6): qty 1

## 2013-11-02 MED ORDER — DEXTROSE 5 % IV SOLN
1.0000 g | INTRAVENOUS | Status: DC
  Administered 2013-11-02: 1 g via INTRAVENOUS
  Filled 2013-11-02: qty 10

## 2013-11-02 MED ORDER — HYDROMORPHONE HCL PF 1 MG/ML IJ SOLN
1.0000 mg | INTRAMUSCULAR | Status: DC | PRN
  Administered 2013-11-02 – 2013-11-04 (×4): 1 mg via INTRAVENOUS
  Filled 2013-11-02 (×6): qty 1

## 2013-11-02 MED ORDER — PROMETHAZINE HCL 25 MG/ML IJ SOLN
12.5000 mg | Freq: Four times a day (QID) | INTRAMUSCULAR | Status: DC | PRN
  Administered 2013-11-02 – 2013-11-04 (×6): 12.5 mg via INTRAVENOUS
  Filled 2013-11-02 (×6): qty 1

## 2013-11-02 MED ORDER — FAMOTIDINE 20 MG PO TABS
20.0000 mg | ORAL_TABLET | Freq: Two times a day (BID) | ORAL | Status: DC | PRN
  Administered 2013-11-03 (×2): 20 mg via ORAL
  Filled 2013-11-02 (×3): qty 1

## 2013-11-02 NOTE — Progress Notes (Signed)
[facility] PROGRESS NOTE  Caroline Boone ZOX:[id number] DOB: Jun 01, 1966 DOA: 11/01/2013 PCP: Nelwyn Salisbury, MD  Assessment/Plan: Pyelonephritis -Urine Cx from earlier this week at [facility] is growing pansensitive E. Coli. -Suspect the reason for her somewhat benign U/A  in the hospital is that she has already been partially treated with Keflex. -Will change antibiotics to PO cipro.  Nausea -Suspect related to pyelo or to the antibiotics as she only developed nausea after receiving it. -Treat with zofran/phenergan.  Headache -Likely due to her acute illness. -Had a flu swab at her PCPs office earlier this week that was negative.  Code Status: Full Code Family Communication: Significant other at bedside updated on plan of care.  Disposition Plan: Home when stable; anticipate 24-48 hours.   Consultants:  None   Antibiotics:  Cipro   Subjective: C/o significant HA and nausea.  Objective: Filed Vitals:   11/01/13 2211 11/02/13 0040 11/02/13 0614 11/02/13 0900  BP: 130/69 118/63 121/72 120/72  Pulse: 103  94 92  Temp: 99.3 F (37.4 C)  97.9 F (36.6 C) 98.6 F (37 C)  TempSrc: Oral  Oral Oral  Resp: 16  18 18   Height:      Weight: 66.679 kg (147 lb)     SpO2:   100% 100%    Intake/Output Summary (Last 24 hours) at 11/02/13 1402 Last data filed at 11/02/13 8119  Gross per 24 hour  Intake 2236.25 ml  Output      1 ml  Net 2235.25 ml   Filed Weights   11/01/13 1458 11/01/13 2211  Weight: 66.679 kg (147 lb) 66.679 kg (147 lb)    Exam:   General:  AA Ox3  Cardiovascular: RRR  Respiratory: CTA B  Abdomen: S/NT/ND/+BS  Extremities: no C/C/E   Neurologic:  Non-focal.  Data Reviewed: Basic Metabolic Panel:  Recent Labs Lab 11/01/13 1511 11/01/13 2255  NA 138  --   K 4.4  --   CL 97  --   CO2 26  --   GLUCOSE 96  --   BUN 9  --   CREATININE 0.68 0.57  CALCIUM 9.6  --    Liver Function Tests:  Recent Labs Lab 11/01/13 1511  AST 13   ALT 10  ALKPHOS 85  BILITOT 0.6  PROT 7.7  ALBUMIN 3.8   No results found for this basename: LIPASE, AMYLASE,  in the last 168 hours No results found for this basename: AMMONIA,  in the last 168 hours CBC:  Recent Labs Lab 11/01/13 1511 11/01/13 2255  WBC 14.9* 14.9*  NEUTROABS 11.6*  --   HGB 14.2 12.4  HCT 41.6 36.5  MCV 93.5 94.1  PLT 276 228   Cardiac Enzymes: No results found for this basename: CKTOTAL, CKMB, CKMBINDEX, TROPONINI,  in the last 168 hours BNP (last 3 results) No results found for this basename: PROBNP,  in the last 8760 hours CBG: No results found for this basename: GLUCAP,  in the last 168 hours  No results found for this or any previous visit (from the past 240 hour(s)).   Studies: Ct Abdomen Pelvis Wo Contrast  11/01/2013   CLINICAL DATA:  Fever, right flank pain.  EXAM: CT ABDOMEN AND PELVIS WITHOUT CONTRAST  TECHNIQUE: Multidetector CT imaging of the abdomen and pelvis was performed following the standard protocol without intravenous contrast.  COMPARISON:  Ultrasound 05/17/2012  FINDINGS: Visualized lung bases clear. Bilateral breast implants partially visualized. Unremarkable liver, spleen, adrenal glands, kidneys. No nephrolithiasis or  hydronephrosis. Unenhanced CT was performed per clinician order. Lack of IV contrast limits sensitivity and specificity, especially for evaluation of abdominal/pelvic solid viscera.  Gallbladder is distended. Unremarkable aorta. Stomach, small bowel, and colon are nondilated. Normal appendix. Urinary bladder incompletely distended. Uterus and adnexal regions unremarkable. Left pelvic phleboliths. No ascites. No free air. Mild thoracolumbar dextroscoliosis apex T12 without underlying vertebral anomaly.  IMPRESSION: 1. Distended gallbladder.  Correlate with NPO status. 2. Normal appendix. 3. No acute abdominal process.   Electronically Signed   By: Oley Balmaniel  Hassell M.D.   On: 11/01/2013 20:12    Scheduled Meds: . aspirin  EC  81 mg Oral Daily  . cefTRIAXone (ROCEPHIN)  IV  1 g Intravenous Q24H  . enoxaparin (LOVENOX) injection  40 mg Subcutaneous QHS  . hydrochlorothiazide  25 mg Oral Daily  . lisinopril  20 mg Oral QHS  . metoprolol succinate  25 mg Oral q morning - 10a   Continuous Infusions: . sodium chloride 125 mL/hr at 11/02/13 0537    Principal Problem:   Pyelonephritis Active Problems:   Nausea with vomiting    Time spent: 35 minutes. Greater than 50% of this time was spent in direct contact with the patient coordinating care.    Chaya JanHERNANDEZ ACOSTA,Sharonann Malbrough  [facility] Pager [phone]  If 7PM-7AM, please contact night-coverage at [email or web address], password [email or web address] 11/02/2013, 2:02 PM  LOS: 1 day

## 2013-11-02 NOTE — Progress Notes (Signed)
Utilization review completed. Eion Timbrook, RN, BSN. 

## 2013-11-02 NOTE — Progress Notes (Signed)
New Admission Note:  Arrival Method: Patient arrived on floor with RN via bed @2200  Mental Orientation: A&OX4 Telemetry: n/a Assessment: Completed Skin: dry and intact Pain: 7 out of 10, MD notified Tubes: n/a Safety Measures: Safety Fall Prevention Plan was given, discussed and signed. Admission: initiated [facility] Orientation: Patient has been orientated to the room, unit and the staff. Family: at bedside Orders have been reviewed and implemented. Will continue to monitor the patient. Call light has been placed within reach and bed alarm has been activated.   Tempie DonningMercy Lorynn Moeser BSN, RN  Phone Number: [phone] [facility]

## 2013-11-03 DIAGNOSIS — R112 Nausea with vomiting, unspecified: Secondary | ICD-10-CM

## 2013-11-03 LAB — BASIC METABOLIC PANEL
BUN: 6 mg/dL (ref 6–23)
CALCIUM: 8.4 mg/dL (ref 8.4–10.5)
CO2: 22 mEq/L (ref 19–32)
CREATININE: 0.44 mg/dL — AB (ref 0.50–1.10)
Chloride: 105 mEq/L (ref 96–112)
GLUCOSE: 129 mg/dL — AB (ref 70–99)
Potassium: 3.7 mEq/L (ref 3.7–5.3)
Sodium: 141 mEq/L (ref 137–147)

## 2013-11-03 LAB — CBC
HEMATOCRIT: 34 % — AB (ref 36.0–46.0)
HEMOGLOBIN: 11.5 g/dL — AB (ref 12.0–15.0)
MCH: 31.2 pg (ref 26.0–34.0)
MCHC: 33.8 g/dL (ref 30.0–36.0)
MCV: 92.1 fL (ref 78.0–100.0)
Platelets: 226 10*3/uL (ref 150–400)
RBC: 3.69 MIL/uL — ABNORMAL LOW (ref 3.87–5.11)
RDW: 12 % (ref 11.5–15.5)
WBC: 8.1 10*3/uL (ref 4.0–10.5)

## 2013-11-03 MED ORDER — ALUM & MAG HYDROXIDE-SIMETH 200-200-20 MG/5ML PO SUSP
15.0000 mL | Freq: Four times a day (QID) | ORAL | Status: DC | PRN
  Administered 2013-11-03: 15 mL via ORAL
  Filled 2013-11-03: qty 30

## 2013-11-03 NOTE — Progress Notes (Addendum)
[facility] PROGRESS NOTE  Caroline Boone WGN:[id number] DOB: 1966/01/20 DOA: 11/01/2013 PCP: Nelwyn Salisbury, MD  Assessment/Plan: Pyelonephritis -Urine Cx from earlier this week at [facility] is growing pansensitive E. Coli. -Suspect the reason for her somewhat benign U/A  in the hospital is that she has already been partially treated with Keflex. -Will change antibiotics to PO cipro. -Leukocytosis has resolved.  Nausea -Suspect related to pyelo or to the antibiotics as she only developed nausea after receiving it. -Treat with zofran/phenergan.  Headache -Likely due to her acute illness. -Had a flu swab at her PCPs office earlier this week that was negative.  Code Status: Full Code Family Communication: Patient only. Disposition Plan: Home when stable; anticipate 24-48 hours.   Consultants:  None   Antibiotics:  Cipro   Subjective: States her HA and nausea are much improved from yesterday. Would like to stay one more day to get better control of her nausea.  Objective: Filed Vitals:   11/02/13 2032 11/02/13 2154 11/03/13 0546 11/03/13 1014  BP: 125/69 109/64 117/69 119/81  Pulse: 84  77 81  Temp: 99.1 F (37.3 C)  98.3 F (36.8 C) 97.9 F (36.6 C)  TempSrc: Oral  Oral Oral  Resp: 18  18 16   Height: 5\' 2"  (1.575 m)     Weight: 66.679 kg (147 lb)     SpO2: 100%  100% 99%    Intake/Output Summary (Last 24 hours) at 11/03/13 1404 Last data filed at 11/03/13 0925  Gross per 24 hour  Intake    480 ml  Output      0 ml  Net    480 ml   Filed Weights   11/01/13 1458 11/01/13 2211 11/02/13 2032  Weight: 66.679 kg (147 lb) 66.679 kg (147 lb) 66.679 kg (147 lb)    Exam:   General:  AA Ox3  Cardiovascular: RRR  Respiratory: CTA B  Abdomen: S/NT/ND/+BS  Extremities: no C/C/E   Neurologic:  Non-focal.  Data Reviewed: Basic Metabolic Panel:  Recent Labs Lab 11/01/13 1511 11/01/13 2255 11/03/13 0431  NA 138  --  141  K 4.4  --  3.7  CL 97  --   105  CO2 26  --  22  GLUCOSE 96  --  129*  BUN 9  --  6  CREATININE 0.68 0.57 0.44*  CALCIUM 9.6  --  8.4   Liver Function Tests:  Recent Labs Lab 11/01/13 1511  AST 13  ALT 10  ALKPHOS 85  BILITOT 0.6  PROT 7.7  ALBUMIN 3.8   No results found for this basename: LIPASE, AMYLASE,  in the last 168 hours No results found for this basename: AMMONIA,  in the last 168 hours CBC:  Recent Labs Lab 11/01/13 1511 11/01/13 2255 11/03/13 0431  WBC 14.9* 14.9* 8.1  NEUTROABS 11.6*  --   --   HGB 14.2 12.4 11.5*  HCT 41.6 36.5 34.0*  MCV 93.5 94.1 92.1  PLT 276 228 226   Cardiac Enzymes: No results found for this basename: CKTOTAL, CKMB, CKMBINDEX, TROPONINI,  in the last 168 hours BNP (last 3 results) No results found for this basename: PROBNP,  in the last 8760 hours CBG: No results found for this basename: GLUCAP,  in the last 168 hours  Recent Results (from the past 240 hour(s))  URINE CULTURE     Status: None   Collection Time    11/01/13  5:27 PM      Result Value Range Status  Specimen Description URINE, CLEAN CATCH   Final   Special Requests NONE   Final   Culture  Setup Time     Final   Value: 11/01/2013 22:08     Performed at [facility] Count     Final   Value: NO GROWTH     Performed at [facility]   Culture     Final   Value: NO GROWTH     Performed at [facility]   Report Status 11/02/2013 FINAL   Final  CULTURE, BLOOD (ROUTINE X 2)     Status: None   Collection Time    11/01/13 10:02 PM      Result Value Range Status   Specimen Description BLOOD RIGHT ARM   Final   Special Requests BOTTLES DRAWN AEROBIC ONLY 5CC   Final   Culture  Setup Time     Final   Value: 11/02/2013 03:08     Performed at [facility]   Culture     Final   Value:        BLOOD CULTURE RECEIVED NO GROWTH TO DATE CULTURE WILL BE HELD FOR 5 DAYS BEFORE ISSUING A FINAL NEGATIVE REPORT     Performed at [facility]   Report  Status PENDING   Incomplete  CULTURE, BLOOD (ROUTINE X 2)     Status: None   Collection Time    11/01/13 11:03 PM      Result Value Range Status   Specimen Description BLOOD RIGHT HAND   Final   Special Requests BOTTLES DRAWN AEROBIC ONLY 5CC   Final   Culture  Setup Time     Final   Value: 11/02/2013 03:51     Performed at [facility]   Culture     Final   Value:        BLOOD CULTURE RECEIVED NO GROWTH TO DATE CULTURE WILL BE HELD FOR 5 DAYS BEFORE ISSUING A FINAL NEGATIVE REPORT     Performed at [facility]   Report Status PENDING   Incomplete     Studies: Ct Abdomen Pelvis Wo Contrast  11/01/2013   CLINICAL DATA:  Fever, right flank pain.  EXAM: CT ABDOMEN AND PELVIS WITHOUT CONTRAST  TECHNIQUE: Multidetector CT imaging of the abdomen and pelvis was performed following the standard protocol without intravenous contrast.  COMPARISON:  Ultrasound 05/17/2012  FINDINGS: Visualized lung bases clear. Bilateral breast implants partially visualized. Unremarkable liver, spleen, adrenal glands, kidneys. No nephrolithiasis or hydronephrosis. Unenhanced CT was performed per clinician order. Lack of IV contrast limits sensitivity and specificity, especially for evaluation of abdominal/pelvic solid viscera.  Gallbladder is distended. Unremarkable aorta. Stomach, small bowel, and colon are nondilated. Normal appendix. Urinary bladder incompletely distended. Uterus and adnexal regions unremarkable. Left pelvic phleboliths. No ascites. No free air. Mild thoracolumbar dextroscoliosis apex T12 without underlying vertebral anomaly.  IMPRESSION: 1. Distended gallbladder.  Correlate with NPO status. 2. Normal appendix. 3. No acute abdominal process.   Electronically Signed   By: Oley Balm M.D.   On: 11/01/2013 20:12    Scheduled Meds: . aspirin EC  81 mg Oral Daily  . ciprofloxacin  500 mg Oral BID  . enoxaparin (LOVENOX) injection  40 mg Subcutaneous QHS  . hydrochlorothiazide  25 mg  Oral Daily  . lisinopril  20 mg Oral QHS  . metoprolol succinate  25 mg Oral q morning - 10a   Continuous Infusions: . sodium chloride  125 mL/hr at 11/03/13 [id number]    Principal Problem:   Pyelonephritis Active Problems:   Nausea with vomiting    Time spent: 25 minutes. Greater than 50% of this time was spent in direct contact with the patient coordinating care.    Chaya JanHERNANDEZ ACOSTA,Lio Wehrly  [facility] Pager [phone]  If 7PM-7AM, please contact night-coverage at [email or web address], password [email or web address] 11/03/2013, 2:04 PM  LOS: 2 days

## 2013-11-04 MED ORDER — ONDANSETRON HCL 4 MG PO TABS: 4.0000 mg | ORAL_TABLET | Freq: Four times a day (QID) | ORAL | Status: DC | PRN

## 2013-11-04 MED ORDER — PROMETHAZINE HCL 12.5 MG PO TABS: 12.5000 mg | ORAL_TABLET | Freq: Four times a day (QID) | ORAL | Status: DC | PRN

## 2013-11-04 MED ORDER — CIPROFLOXACIN HCL 500 MG PO TABS: 500.0000 mg | ORAL_TABLET | Freq: Two times a day (BID) | ORAL | Status: DC

## 2013-11-04 NOTE — Discharge Summary (Signed)
Physician Discharge Summary  Caroline Boone RUE:[id number] DOB: November 26, 1965 DOA: 11/01/2013  PCP: Nelwyn Salisbury, MD  Admit date: 11/01/2013 Discharge date: 11/04/2013  Time spent: 45 minutes  Recommendations for Outpatient Follow-up:  -Will be discharged home today. -Advised to follow up with PCP in 2  Weeks.   Discharge Diagnoses:  Principal Problem:   Pyelonephritis Active Problems:   Nausea with vomiting   Discharge Condition: Stable and improved  Filed Weights   11/01/13 2211 11/02/13 2032 11/03/13 2145  Weight: 66.679 kg (147 lb) 66.679 kg (147 lb) 67.2 kg (148 lb 2.4 oz)    History of present illness:  Caroline Boone is a 48 y.o. female, with a history of FMD and left carotid dissection, presenting with back pain for a few weeks duration however started having fever yesterday and was started on by mouth antibiotics for UTI by nephrology.  Cultures were done yesterday.  Patient reports episodes of nausea vomiting in addition to the fever , mild dysuria but no blood in her urine.  CT of the abdomen in the emergency room came back negative and hospitalist admission was requested.  Hospital Course:   Pyelonephritis  -Urine Cx from earlier this week at [facility] is growing pansensitive E. Coli.  -Suspect the reason for her somewhat benign U/A and negative urine cx in the hospital is that she has already been partially treated with Keflex.  -Will change antibiotics to PO cipro for 10 more days on DC. -Leukocytosis has resolved.   Nausea  -Suspect related to pyelo or to the antibiotics as she only developed nausea after receiving it.  -Treat with zofran/phenergan.  -Resolved by time of DC.  Headache  -Likely due to her acute illness.  -Had a flu swab at her PCPs office earlier this week that was negative. -Resolved by time of DC   Procedures:  None   Consultations:  None  Discharge Instructions  Discharge Orders   Future Orders Complete By Expires   Diet - low  sodium heart healthy  As directed    Discontinue IV  As directed    Increase activity slowly  As directed        Medication List    STOP taking these medications       cephALEXin 500 MG capsule  Commonly known as:  KEFLEX      TAKE these medications       acetaminophen 500 MG tablet  Commonly known as:  TYLENOL  Take 1,000 mg by mouth every 6 (six) hours as needed for moderate pain.     ALPRAZolam 1 MG tablet  Commonly known as:  XANAX  Take 1 mg by mouth 3 (three) times daily as needed for anxiety.     aspirin 81 MG tablet  Take 81 mg by mouth daily.     ciprofloxacin 500 MG tablet  Commonly known as:  CIPRO  Take 1 tablet (500 mg total) by mouth 2 (two) times daily.     cloNIDine 0.1 MG tablet  Commonly known as:  CATAPRES  Take 0.1 mg by mouth as needed (for high BP; takes along with lisinopril as needed).     cyclobenzaprine 10 MG tablet  Commonly known as:  FLEXERIL  take 1 tablet by mouth three times a day if needed for muscle spasm     hydrochlorothiazide 25 MG tablet  Commonly known as:  HYDRODIURIL  take 1 tablet by mouth once daily     HYDROcodone-acetaminophen 5-325 MG per tablet  Commonly  known as:  NORCO/VICODIN  Take 1 tablet by mouth every 6 (six) hours as needed for pain.     lisinopril 20 MG tablet  Commonly known as:  PRINIVIL,ZESTRIL  Take 20-40 mg by mouth daily as needed (takes 20mg  at bedtime everyday, then another 20mg  as needed for high BP).     metoprolol succinate 25 MG 24 hr tablet  Commonly known as:  TOPROL-XL  Take 25 mg by mouth every morning.     promethazine 12.5 MG tablet  Commonly known as:  PHENERGAN  Take 1 tablet (12.5 mg total) by mouth every 6 (six) hours as needed for nausea or vomiting.     zolpidem 10 MG tablet  Commonly known as:  AMBIEN  Take 10 mg by mouth at bedtime as needed for sleep.       Allergies  Allergen Reactions  . Amoxicillin Nausea And Vomiting  . Sulfonamide Derivatives Hives        Follow-up Information   Follow up with FRY,STEPHEN A, MD. Schedule an appointment as soon as possible for a visit in 2 weeks.   Specialty:  Family Medicine   Contact information:   [address] Christena Flake [place] [address] [phone]        The results of significant diagnostics from this hospitalization (including imaging, microbiology, ancillary and laboratory) are listed below for reference.    Significant Diagnostic Studies: Ct Abdomen Pelvis Wo Contrast  11/01/2013   CLINICAL DATA:  Fever, right flank pain.  EXAM: CT ABDOMEN AND PELVIS WITHOUT CONTRAST  TECHNIQUE: Multidetector CT imaging of the abdomen and pelvis was performed following the standard protocol without intravenous contrast.  COMPARISON:  Ultrasound 05/17/2012  FINDINGS: Visualized lung bases clear. Bilateral breast implants partially visualized. Unremarkable liver, spleen, adrenal glands, kidneys. No nephrolithiasis or hydronephrosis. Unenhanced CT was performed per clinician order. Lack of IV contrast limits sensitivity and specificity, especially for evaluation of abdominal/pelvic solid viscera.  Gallbladder is distended. Unremarkable aorta. Stomach, small bowel, and colon are nondilated. Normal appendix. Urinary bladder incompletely distended. Uterus and adnexal regions unremarkable. Left pelvic phleboliths. No ascites. No free air. Mild thoracolumbar dextroscoliosis apex T12 without underlying vertebral anomaly.  IMPRESSION: 1. Distended gallbladder.  Correlate with NPO status. 2. Normal appendix. 3. No acute abdominal process.   Electronically Signed   By: Oley Balm M.D.   On: 11/01/2013 20:12    Microbiology: Recent Results (from the past 240 hour(s))  URINE CULTURE     Status: None   Collection Time    11/01/13  5:27 PM      Result Value Range Status   Specimen Description URINE, CLEAN CATCH   Final   Special Requests NONE   Final   Culture  Setup Time     Final   Value: 11/01/2013 22:08      Performed at [facility] Count     Final   Value: NO GROWTH     Performed at [facility]   Culture     Final   Value: NO GROWTH     Performed at [facility]   Report Status 11/02/2013 FINAL   Final  CULTURE, BLOOD (ROUTINE X 2)     Status: None   Collection Time    11/01/13 10:02 PM      Result Value Range Status   Specimen Description BLOOD RIGHT ARM   Final   Special Requests BOTTLES DRAWN AEROBIC ONLY 5CC   Final  Culture  Setup Time     Final   Value: 11/02/2013 03:08     [facility]formed at [facility]   Culture     Final   Value:        BLOOD CULTURE RECEIVED NO GROWTH TO DATE CULTURE WILL BE HELD FOR 5 DAYS BEFORE ISSUING A FINAL NEGATIVE REPORT     Performed at [facility]   Report Status PENDING   Incomplete  CULTURE, BLOOD (ROUTINE X 2)     Status: None   Collection Time    11/01/13 11:03 PM      Result Value Range Status   Specimen Description BLOOD RIGHT HAND   Final   Special Requests BOTTLES DRAWN AEROBIC ONLY 5CC   Final   Culture  Setup Time     Final   Value: 11/02/2013 03:51     Performed at [facility]   Culture     Final   Value:        BLOOD CULTURE RECEIVED NO GROWTH TO DATE CULTURE WILL BE HELD FOR 5 DAYS BEFORE ISSUING A FINAL NEGATIVE REPORT     Performed at [facility]   Report Status PENDING   Incomplete     Labs: Basic Metabolic Panel:  Recent Labs Lab 11/01/13 1511 11/01/13 2255 11/03/13 0431  NA 138  --  141  K 4.4  --  3.7  CL 97  --  105  CO2 26  --  22  GLUCOSE 96  --  129*  BUN 9  --  6  CREATININE 0.68 0.57 0.44*  CALCIUM 9.6  --  8.4   Liver Function Tests:  Recent Labs Lab 11/01/13 1511  AST 13  ALT 10  ALKPHOS 85  BILITOT 0.6  PROT 7.7  ALBUMIN 3.8   No results found for this basename: LIPASE, AMYLASE,  in the last 168 hours No results found for this basename: AMMONIA,  in the last 168 hours CBC:  Recent Labs Lab 11/01/13 1511 11/01/13 2255  11/03/13 0431  WBC 14.9* 14.9* 8.1  NEUTROABS 11.6*  --   --   HGB 14.2 12.4 11.5*  HCT 41.6 36.5 34.0*  MCV 93.5 94.1 92.1  PLT 276 228 226   Cardiac Enzymes: No results found for this basename: CKTOTAL, CKMB, CKMBINDEX, TROPONINI,  in the last 168 hours BNP: BNP (last 3 results) No results found for this basename: PROBNP,  in the last 8760 hours CBG: No results found for this basename: GLUCAP,  in the last 168 hours     Signed:  Chaya JanHERNANDEZ ACOSTA,Sabreena Vogan  [facility] Pager: [phone] 11/04/2013, 6:30 PM

## 2013-11-04 NOTE — Progress Notes (Signed)
11/04/13 1200 nsg Pt to d/c home ; preferred to walk with husband on d/c.

## 2013-11-05 ENCOUNTER — Telehealth: Payer: Self-pay | Admitting: Family Medicine

## 2013-11-05 NOTE — Telephone Encounter (Signed)
[facility] requesting refill of zolpidem (AMBIEN) 10 MG tablet #30, last filled 10/01/13

## 2013-11-06 MED ORDER — ZOLPIDEM TARTRATE 10 MG PO TABS: 10.0000 mg | ORAL_TABLET | Freq: Every evening | ORAL | Status: DC | PRN

## 2013-11-06 NOTE — Telephone Encounter (Signed)
Call in #[phone] with 5 rf 

## 2013-11-06 NOTE — Telephone Encounter (Signed)
I called in script 

## 2013-11-08 LAB — CULTURE, BLOOD (ROUTINE X 2)
CULTURE: NO GROWTH
CULTURE: NO GROWTH

## 2013-11-29 ENCOUNTER — Other Ambulatory Visit: Payer: Self-pay | Admitting: Physician Assistant

## 2013-11-30 ENCOUNTER — Telehealth: Payer: Self-pay | Admitting: Family Medicine

## 2013-11-30 ENCOUNTER — Other Ambulatory Visit: Payer: Self-pay | Admitting: Physician Assistant

## 2013-11-30 NOTE — Telephone Encounter (Signed)
Pt request rx HYDROcodone-acetaminophen (NORCO/VICODIN) 5-325 MG per tablet ° °

## 2013-12-01 MED ORDER — HYDROCODONE-ACETAMINOPHEN 5-325 MG PO TABS: 1.0000 | ORAL_TABLET | Freq: Four times a day (QID) | ORAL | Status: DC | PRN

## 2013-12-01 NOTE — Telephone Encounter (Signed)
Done for one month only. She needs testing and a contract  

## 2013-12-01 NOTE — Telephone Encounter (Signed)
Script is ready for pick up and I spoke with pt.  

## 2014-01-17 ENCOUNTER — Other Ambulatory Visit: Payer: Self-pay

## 2014-01-29 ENCOUNTER — Telehealth: Payer: Self-pay | Admitting: Family Medicine

## 2014-01-29 ENCOUNTER — Other Ambulatory Visit: Payer: Self-pay | Admitting: Obstetrics

## 2014-01-29 DIAGNOSIS — N6320 Unspecified lump in the left breast, unspecified quadrant: Secondary | ICD-10-CM

## 2014-01-29 NOTE — Telephone Encounter (Signed)
Pt requesting re-fill on HYDROcodone-acetaminophen (NORCO/VICODIN) 5-325 MG per tablet

## 2014-01-30 MED ORDER — HYDROCODONE-ACETAMINOPHEN 5-325 MG PO TABS: 1.0000 | ORAL_TABLET | Freq: Four times a day (QID) | ORAL | Status: DC | PRN

## 2014-01-30 NOTE — Telephone Encounter (Signed)
done

## 2014-01-31 NOTE — Telephone Encounter (Signed)
Called and spoke with pt and pt is aware.  Pt will give sample today.

## 2014-02-07 ENCOUNTER — Ambulatory Visit
Admission: RE | Admit: 2014-02-07 | Discharge: 2014-02-07 | Disposition: A | Discharge status: Home / Self Care | Payer: BC Managed Care – PPO | Source: Ambulatory Visit | Attending: Obstetrics | Admitting: Obstetrics

## 2014-02-07 ENCOUNTER — Other Ambulatory Visit: Payer: Self-pay | Admitting: Obstetrics

## 2014-02-07 ENCOUNTER — Encounter (INDEPENDENT_AMBULATORY_CARE_PROVIDER_SITE_OTHER): Payer: Self-pay

## 2014-02-07 DIAGNOSIS — N6310 Unspecified lump in the right breast, unspecified quadrant: Secondary | ICD-10-CM

## 2014-02-07 DIAGNOSIS — N6320 Unspecified lump in the left breast, unspecified quadrant: Secondary | ICD-10-CM

## 2014-02-26 ENCOUNTER — Other Ambulatory Visit: Payer: Self-pay | Admitting: Family Medicine

## 2014-02-27 NOTE — Telephone Encounter (Signed)
Call in #[phone] with 5 rf 

## 2014-03-05 ENCOUNTER — Telehealth: Payer: Self-pay | Admitting: Family Medicine

## 2014-03-05 NOTE — Telephone Encounter (Signed)
[facility] - [place], [place] - [address] is requesting 90 day re-fill oALPRAZolam (XANAX) 1 MG tablet

## 2014-03-06 NOTE — Telephone Encounter (Signed)
Call in #[phone] with one rf 

## 2014-03-07 MED ORDER — ALPRAZOLAM 1 MG PO TABS: ORAL_TABLET | ORAL | Status: DC

## 2014-03-07 NOTE — Telephone Encounter (Signed)
I called in script 

## 2014-03-30 ENCOUNTER — Telehealth: Payer: Self-pay | Admitting: Family Medicine

## 2014-03-30 NOTE — Telephone Encounter (Signed)
Pt would like md to accept her son as new pt. Can I sch?

## 2014-03-30 NOTE — Telephone Encounter (Signed)
Sorry but I am too full. I might suggest Caroline Boone instead

## 2014-03-30 NOTE — Telephone Encounter (Signed)
Pt mom is aware 

## 2014-04-10 ENCOUNTER — Telehealth: Payer: Self-pay | Admitting: Family Medicine

## 2014-04-10 NOTE — Telephone Encounter (Signed)
Ok to refill 30#  Further refills  And amount per dr Clent RidgesFry.  Appears no contract  I or screen n record  Please arrange for contract and tox screen or as per dr Clent RidgesFry

## 2014-04-10 NOTE — Telephone Encounter (Signed)
Pt needs re-fill on HYDROcodone-acetaminophen (NORCO/VICODIN) 5-325 MG per tablet ° °

## 2014-04-11 MED ORDER — HYDROCODONE-ACETAMINOPHEN 5-325 MG PO TABS: 1.0000 | ORAL_TABLET | Freq: Four times a day (QID) | ORAL | Status: DC | PRN

## 2014-04-11 NOTE — Telephone Encounter (Signed)
Script is ready for pick up, contract printed and I left a voice message for pt. 

## 2014-04-23 ENCOUNTER — Other Ambulatory Visit: Payer: Self-pay | Admitting: Family Medicine

## 2014-04-25 NOTE — Telephone Encounter (Signed)
Call in #[phone] with 5 rf 

## 2014-04-26 ENCOUNTER — Telehealth: Payer: Self-pay | Admitting: Family Medicine

## 2014-04-26 NOTE — Telephone Encounter (Signed)
Pt called and requested that her med list be faxed over [facility] phone number [phone]

## 2014-04-27 HISTORY — PX: COLONOSCOPY: SHX174

## 2014-04-27 LAB — HM COLONOSCOPY

## 2014-04-30 ENCOUNTER — Encounter: Payer: Self-pay | Admitting: Family Medicine

## 2014-04-30 NOTE — Telephone Encounter (Signed)
I faxed a copy of medication list to [phone].

## 2014-05-03 ENCOUNTER — Telehealth: Payer: Self-pay | Admitting: Family Medicine

## 2014-05-03 NOTE — Telephone Encounter (Signed)
Pt is needing new rx HYDROcodone-acetaminophen (NORCO/VICODIN) 5-325 MG per tablet, pt states that a partial rx was called in, in June however pt is going out of town on Tuesday of next week so she was sure how to got about getting the remaining meds/ please call when available for pick up

## 2014-05-04 MED ORDER — HYDROCODONE-ACETAMINOPHEN 5-325 MG PO TABS: 1.0000 | ORAL_TABLET | Freq: Four times a day (QID) | ORAL | Status: DC | PRN

## 2014-05-04 NOTE — Telephone Encounter (Signed)
Script is ready for pick up and I spoke with pt.  

## 2014-05-04 NOTE — Telephone Encounter (Signed)
done

## 2014-05-08 ENCOUNTER — Encounter: Payer: Self-pay | Admitting: Family Medicine

## 2014-06-24 ENCOUNTER — Encounter (HOSPITAL_BASED_OUTPATIENT_CLINIC_OR_DEPARTMENT_OTHER): Payer: Self-pay | Admitting: Emergency Medicine

## 2014-06-24 ENCOUNTER — Emergency Department (HOSPITAL_BASED_OUTPATIENT_CLINIC_OR_DEPARTMENT_OTHER)
Admission: EM | Admit: 2014-06-24 | Discharge: 2014-06-24 | Disposition: A | Discharge status: Home / Self Care | Payer: BC Managed Care – PPO | Attending: Emergency Medicine | Admitting: Emergency Medicine

## 2014-06-24 ENCOUNTER — Emergency Department (HOSPITAL_BASED_OUTPATIENT_CLINIC_OR_DEPARTMENT_OTHER): Payer: BC Managed Care – PPO

## 2014-06-24 DIAGNOSIS — Z8719 Personal history of other diseases of the digestive system: Secondary | ICD-10-CM | POA: Diagnosis not present

## 2014-06-24 DIAGNOSIS — Z880 Allergy status to penicillin: Secondary | ICD-10-CM | POA: Diagnosis not present

## 2014-06-24 DIAGNOSIS — Z79899 Other long term (current) drug therapy: Secondary | ICD-10-CM | POA: Insufficient documentation

## 2014-06-24 DIAGNOSIS — Z9889 Other specified postprocedural states: Secondary | ICD-10-CM | POA: Insufficient documentation

## 2014-06-24 DIAGNOSIS — J0100 Acute maxillary sinusitis, unspecified: Secondary | ICD-10-CM | POA: Diagnosis not present

## 2014-06-24 DIAGNOSIS — I10 Essential (primary) hypertension: Secondary | ICD-10-CM | POA: Insufficient documentation

## 2014-06-24 DIAGNOSIS — F411 Generalized anxiety disorder: Secondary | ICD-10-CM | POA: Insufficient documentation

## 2014-06-24 DIAGNOSIS — Z7982 Long term (current) use of aspirin: Secondary | ICD-10-CM | POA: Insufficient documentation

## 2014-06-24 DIAGNOSIS — Z792 Long term (current) use of antibiotics: Secondary | ICD-10-CM | POA: Insufficient documentation

## 2014-06-24 DIAGNOSIS — Z87828 Personal history of other (healed) physical injury and trauma: Secondary | ICD-10-CM | POA: Insufficient documentation

## 2014-06-24 DIAGNOSIS — J0101 Acute recurrent maxillary sinusitis: Secondary | ICD-10-CM

## 2014-06-24 DIAGNOSIS — R002 Palpitations: Secondary | ICD-10-CM | POA: Insufficient documentation

## 2014-06-24 MED ORDER — AZITHROMYCIN 250 MG PO TABS
ORAL_TABLET | ORAL | Status: DC
Start: 2014-06-24 — End: 2014-07-23

## 2014-06-24 MED ORDER — ACETAMINOPHEN 500 MG PO TABS: 1000.0000 mg | ORAL_TABLET | Freq: Once | ORAL | Status: DC

## 2014-06-24 NOTE — Discharge Instructions (Signed)
Sinusitis °Sinusitis is redness, soreness, and inflammation of the paranasal sinuses. Paranasal sinuses are air pockets within the bones of your face (beneath the eyes, the middle of the forehead, or above the eyes). In healthy paranasal sinuses, mucus is able to drain out, and air is able to circulate through them by way of your nose. However, when your paranasal sinuses are inflamed, mucus and air can become trapped. This can allow bacteria and other germs to grow and cause infection. °Sinusitis can develop quickly and last only a short time (acute) or continue over a long period (chronic). Sinusitis that lasts for more than 12 weeks is considered chronic.  °CAUSES  °Causes of sinusitis include: °· Allergies. °· Structural abnormalities, such as displacement of the cartilage that separates your nostrils (deviated septum), which can decrease the air flow through your nose and sinuses and affect sinus drainage. °· Functional abnormalities, such as when the small hairs (cilia) that line your sinuses and help remove mucus do not work properly or are not present. °SIGNS AND SYMPTOMS  °Symptoms of acute and chronic sinusitis are the same. The primary symptoms are pain and pressure around the affected sinuses. Other symptoms include: °· Upper toothache. °· Earache. °· Headache. °· Bad breath. °· Decreased sense of smell and taste. °· A cough, which worsens when you are lying flat. °· Fatigue. °· Fever. °· Thick drainage from your nose, which often is green and may contain pus (purulent). °· Swelling and warmth over the affected sinuses. °DIAGNOSIS  °Your health care provider will perform a physical exam. During the exam, your health care provider may: °· Look in your nose for signs of abnormal growths in your nostrils (nasal polyps). °· Tap over the affected sinus to check for signs of infection. °· View the inside of your sinuses (endoscopy) using an imaging device that has a light attached (endoscope). °If your health  care provider suspects that you have chronic sinusitis, one or more of the following tests may be recommended: °· Allergy tests. °· Nasal culture. A sample of mucus is taken from your nose, sent to a lab, and screened for bacteria. °· Nasal cytology. A sample of mucus is taken from your nose and examined by your health care provider to determine if your sinusitis is related to an allergy. °TREATMENT  °Most cases of acute sinusitis are related to a viral infection and will resolve on their own within 10 days. Sometimes medicines are prescribed to help relieve symptoms (pain medicine, decongestants, nasal steroid sprays, or saline sprays).  °However, for sinusitis related to a bacterial infection, your health care provider will prescribe antibiotic medicines. These are medicines that will help kill the bacteria causing the infection.  °Rarely, sinusitis is caused by a fungal infection. In theses cases, your health care provider will prescribe antifungal medicine. °For some cases of chronic sinusitis, surgery is needed. Generally, these are cases in which sinusitis recurs more than 3 times per year, despite other treatments. °HOME CARE INSTRUCTIONS  °· Drink plenty of water. Water helps thin the mucus so your sinuses can drain more easily. °· Use a humidifier. °· Inhale steam 3 to 4 times a day (for example, sit in the bathroom with the shower running). °· Apply a warm, moist washcloth to your face 3 to 4 times a day, or as directed by your health care provider. °· Use saline nasal sprays to help moisten and clean your sinuses. °· Take medicines only as directed by your health care provider. °·   If you were prescribed either an antibiotic or antifungal medicine, finish it all even if you start to feel better. SEEK IMMEDIATE MEDICAL CARE IF:  You have increasing pain or severe headaches.  You have nausea, vomiting, or drowsiness.  You have swelling around your face.  You have vision problems.  You have a stiff  neck.  You have difficulty breathing. MAKE SURE YOU:   Understand these instructions.  Will watch your condition.  Will get help right away if you are not doing well or get worse. Document Released: 10/05/2005 Document Revised: 02/19/2014 Document Reviewed: 10/20/2011 [facility] Patient Information 2015 [place], [place]. This information is not intended to replace advice given to you by your health care provider. Make sure you discuss any questions you have with your health care provider.   Palpitations A palpitation is the feeling that your heartbeat is irregular or is faster than normal. It may feel like your heart is fluttering or skipping a beat. Palpitations are usually not a serious problem. However, in some cases, you may need further medical evaluation. CAUSES  Palpitations can be caused by:  Smoking.  Caffeine or other stimulants, such as diet pills or energy drinks.  Alcohol.  Stress and anxiety.  Strenuous physical activity.  Fatigue.  Certain medicines.  Heart disease, especially if you have a history of irregular heart rhythms (arrhythmias), such as atrial fibrillation, atrial flutter, or supraventricular tachycardia.  An improperly working pacemaker or defibrillator. DIAGNOSIS  To find the cause of your palpitations, your health care provider will take your medical history and perform a physical exam. Your health care provider may also have you take a test called an ambulatory electrocardiogram (ECG). An ECG records your heartbeat patterns over a 24-hour period. You may also have other tests, such as:  Transthoracic echocardiogram (TTE). During echocardiography, sound waves are used to evaluate how blood flows through your heart.  Transesophageal echocardiogram (TEE).  Cardiac monitoring. This allows your health care provider to monitor your heart rate and rhythm in real time.  Holter monitor. This is a portable device that records your heartbeat and can help  diagnose heart arrhythmias. It allows your health care provider to track your heart activity for several days, if needed.  Stress tests by exercise or by giving medicine that makes the heart beat faster. TREATMENT  Treatment of palpitations depends on the cause of your symptoms and can vary greatly. Most cases of palpitations do not require any treatment other than time, relaxation, and monitoring your symptoms. Other causes, such as atrial fibrillation, atrial flutter, or supraventricular tachycardia, usually require further treatment. HOME CARE INSTRUCTIONS   Avoid:  Caffeinated coffee, tea, soft drinks, diet pills, and energy drinks.  Chocolate.  Alcohol.  Stop smoking if you smoke.  Reduce your stress and anxiety. Things that can help you relax include:  A method of controlling things in your body, such as your heartbeats, with your mind (biofeedback).  Yoga.  Meditation.  Physical activity such as swimming, jogging, or walking.  Get plenty of rest and sleep. SEEK MEDICAL CARE IF:   You continue to have a fast or irregular heartbeat beyond 24 hours.  Your palpitations occur more often. SEEK IMMEDIATE MEDICAL CARE IF:  You have chest pain or shortness of breath.  You have a severe headache.  You feel dizzy or you faint. MAKE SURE YOU:  Understand these instructions.  Will watch your condition.  Will get help right away if you are not doing well or get worse.  Document Released: 10/02/2000 Document Revised: 10/10/2013 Document Reviewed: 12/04/2011 °[employer]® Patient Information ©2015 [employer]. This information is not intended to replace advice given to you by your health care provider. Make sure you discuss any questions you have with your health care provider. ° ° °

## 2014-06-24 NOTE — ED Notes (Signed)
Pt reports palpitations since Wednesday. Has been taking Sudafed for congestion/sinusitis

## 2014-06-24 NOTE — ED Notes (Signed)
PT refused IV, tele monitor,  xray and blood to be drawn at this time. DR Criss Alvine notified.

## 2014-06-24 NOTE — ED Provider Notes (Signed)
CSN: [id number]     Arrival date & time 06/24/14  2053 History  This chart was scribed for Audree Camel, MD by Julian Hy, ED Scribe. The patient was seen in MH02/MH02. The patient's care was started at 10:18 PM.    Chief Complaint  Patient presents with  . Palpitations   The history is provided by the patient. No language interpreter was used.   HPI Comments: Caroline Boone is a 48 y.o. female who presents to the Emergency Department complaining of new, intermittent, gradually worsening palpitations onset 3 days ago. Pt notes her BP at home was highest at 139/95. Pt took lisinopril with some relief. Pt states it feels like her palpitations last about one minute at one time. Pt left her last palpations approximately 4 hours ago. Pt notes she took sudafed approximately 5.5 hours ago and started feeling palpitations after walking up a flight of steps.  Pt denies SOB, chest heaviness, neck pain, or chest pain. Pt drinks Diet [facility] but this is infrequently. Pt denies drinking coffee. Pt denies illicit drug usage. Pt denies being a smoker.   Pt also complains of congestion, clear and now dark nasal congestion, teeth pain, dry cough, headache, chills, and rhinorrhea. Pt used Mucinex and Sudafed with minimal relief. Pt denies fevers, body aches, abdominal pain, or back pain.   Past Medical History  Diagnosis Date  . Palpitations     hx  . Hypertension     a. echo 7/11: mod LVH, EF 55-65%  . GERD (gastroesophageal reflux disease)   . Insomnia   . Anxiety   . Headache(784.0)   . History of cardiac cath 2007    cath 11/25/05 by Dr. Jenne Campus with normal cors and EF 50%  . Chest pain     GXT echo 8/11: normal LVF, no ischemia;  h/o neg. myoview in 2005  . Carotid stenosis     Dopplers 6/12:0-39% bilateral; distal ICAs with marked tortuosity; CT angiogram recommended;  followed at [facility]; surgery to correct LICA stenosis and pseudoaneurysm too risky - > medical Rx;  dopplers 8/13: B/L 0-39%  (stable)  . 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 Prescott Parma  at [facility]  . Fibromuscular dysplasia     affects left vertebral,  artery, left ICA, left renal artery;  head and neck CTA 10/24/11: stable pattern of fibromuscular dysplasia of ICA and VA with distal changes suggesting prior pseudoaneurysm and dissection; L dist ICA 60%; normal MRI 10/29/11;  followed at [facility] with serial CT scans  . Cerebral aneurysm   . Carotid artery injury    Past Surgical History  Procedure Laterality Date  . Breast enhancement surgery  1991    Bilateral breast    Family History  Problem Relation Age of Onset  . Arthritis      family hx  . Breast cancer      relative ,50  . Diabetes      family hx  . Hypertension      family hx  . Kidney disease      family hx  . Lung cancer      family hx  . Coronary artery disease Mother   . Kidney disease Mother   . Heart disease Mother   . Heart attack Father 45    CABG  . Kidney failure Father   . Hypertension Father   . Heart disease Father    History  Substance Use Topics  . Smoking status: Never Smoker   . Smokeless tobacco: Never Used  . Alcohol Use: No   OB History   Grav Para Term Preterm Abortions TAB SAB Ect Mult Living                 Review of Systems  Constitutional: Positive for chills. Negative for fever.  HENT: Positive for congestion, rhinorrhea and sinus pressure. Negative for sore throat.   Respiratory: Positive for cough. Negative for shortness of breath.   Cardiovascular: Positive for palpitations. Negative for chest pain.  Gastrointestinal: Negative for abdominal pain.  Musculoskeletal: Negative for myalgias and neck pain.  Neurological: Positive for headaches.  All other systems reviewed and are negative.     Allergies  Amoxicillin and Sulfonamide derivatives  Home Medications   Prior to Admission medications   Medication Sig Start Date End Date Taking?  Authorizing Provider  acetaminophen (TYLENOL) 500 MG tablet Take 1,000 mg by mouth every 6 (six) hours as needed for moderate pain.    Historical Provider, MD  ALPRAZolam (XANAX) 1 MG tablet TAKE 1 TABLET 3 TIMES DAILY. 03/07/14   Nelwyn Salisbury, MD  aspirin 81 MG tablet Take 81 mg by mouth daily.      Historical Provider, MD  ciprofloxacin (CIPRO) 500 MG tablet Take 1 tablet (500 mg total) by mouth 2 (two) times daily. 11/04/13   Henderson Cloud, MD  cloNIDine (CATAPRES) 0.1 MG tablet Take 0.1 mg by mouth as needed (for high BP; takes along with lisinopril as needed).     Historical Provider, MD  cyclobenzaprine (FLEXERIL) 10 MG tablet take 1 tablet by mouth three times a day if needed for muscle spasm 06/29/13   Nelwyn Salisbury, MD  hydrochlorothiazide (HYDRODIURIL) 25 MG tablet TAKE 1 TABLET BY MOUTH ONCE DAILY 11/30/13   Rollene Rotunda, MD  HYDROcodone-acetaminophen (NORCO/VICODIN) 5-325 MG per tablet Take 1 tablet by mouth every 6 (six) hours as needed for moderate pain. 05/04/14   Nelwyn Salisbury, MD  lisinopril (PRINIVIL,ZESTRIL) 20 MG tablet Take 20-40 mg by mouth daily as needed (takes  at bedtime everyday, then another  as needed for high BP).     Historical Provider, MD  metoprolol succinate (TOPROL-XL) 25 MG 24 hr tablet Take 25 mg by mouth every morning.     Historical Provider, MD  metoprolol succinate (TOPROL-XL) 25 MG 24 hr tablet take 1 tablet by mouth once daily    Rollene Rotunda, MD  promethazine (PHENERGAN) 12.5 MG tablet Take 1 tablet (12.5 mg total) by mouth every 6 (six) hours as needed for nausea or vomiting. 11/04/13   Henderson Cloud, MD  zolpidem (AMBIEN) 10 MG tablet take 1 tablet by mouth at bedtime if needed    Nelwyn Salisbury, MD   Triage Vitals: BP 116/75  Pulse 82  Temp(Src) 98.2 F (36.8 C)  Resp 20  Ht  (1.575 m)  Wt 148 lb (67.132 kg)  BMI 27.06 kg/m2  SpO2 100% Physical Exam  Nursing note and vitals reviewed. Constitutional: She is  oriented to person, place, and time. She appears well-developed and well-nourished. No distress.  HENT:  Head: Normocephalic and atraumatic.  Right Ear: External ear normal.  Left Ear: External ear normal.  Bilateral maxillary sinus tenderness. Nasal congestion.  Eyes: Right eye exhibits no discharge. Left eye exhibits no discharge.  Neck: Neck supple. No tracheal deviation present. No thyromegaly present.  Cardiovascular: Normal rate, regular rhythm and  normal heart sounds.   Pulmonary/Chest: Effort normal and breath sounds normal. No respiratory distress.  Abdominal: She exhibits no distension. There is no tenderness.  Neurological: She is alert and oriented to person, place, and time.  Skin: Skin is warm and dry.  Psychiatric: She has a normal mood and affect. Her behavior is normal.    ED Course  Procedures (including critical care time) DIAGNOSTIC STUDIES: Oxygen Saturation is 100% on Ra, normal by my interpretation.    COORDINATION OF CARE: 10:26 PM- Patient informed of current plan for treatment and evaluation and agrees with plan at this time.    Labs Review Labs Reviewed - No data to display  Imaging Review No results found.   EKG Interpretation   Date/Time:  Sunday June 24 2014 21:05:41 EDT Ventricular Rate:  88 PR Interval:  134 QRS Duration: 80 QT Interval:  386 QTC Calculation: 467 R Axis:   47 Text Interpretation:  Normal sinus rhythm Normal ECG No significant change  since last tracing Confirmed by Nekia Maxham  MD, Alyssa Mancera ([id number]) on 06/24/2014  10:18:59 PM      MDM   Final diagnoses:  Acute recurrent maxillary sinusitis  Palpitations    Patient's primary reason for visit is for antibiotics and treatment of her acute sinusitis. I questioned her about her palpitations and recommended labwork to r/o obvious causes of her palpitations, which she declines. She states her primary doctor can perform labwork and she is unconcerned about this. I discussed  possible etiologies and concerns and that these could get worse if present and are unrecognized. She understands this and wants to be discharged home. Will d/c with zpack (patient requesting this given prior good history with) and f/u with PCP. Discharged in good condition.  This chart was scribed in my presence and reviewed by me personally.   Audree Camel, MD 06/25/14 [phone]

## 2014-07-23 ENCOUNTER — Emergency Department (HOSPITAL_BASED_OUTPATIENT_CLINIC_OR_DEPARTMENT_OTHER)
Admission: EM | Admit: 2014-07-23 | Discharge: 2014-07-23 | Disposition: A | Discharge status: Home / Self Care | Payer: BC Managed Care – PPO | Attending: Emergency Medicine | Admitting: Emergency Medicine

## 2014-07-23 ENCOUNTER — Emergency Department (HOSPITAL_BASED_OUTPATIENT_CLINIC_OR_DEPARTMENT_OTHER): Payer: BC Managed Care – PPO

## 2014-07-23 ENCOUNTER — Encounter (HOSPITAL_BASED_OUTPATIENT_CLINIC_OR_DEPARTMENT_OTHER): Payer: Self-pay | Admitting: Emergency Medicine

## 2014-07-23 DIAGNOSIS — F419 Anxiety disorder, unspecified: Secondary | ICD-10-CM | POA: Diagnosis not present

## 2014-07-23 DIAGNOSIS — R109 Unspecified abdominal pain: Secondary | ICD-10-CM | POA: Insufficient documentation

## 2014-07-23 DIAGNOSIS — Z880 Allergy status to penicillin: Secondary | ICD-10-CM | POA: Insufficient documentation

## 2014-07-23 DIAGNOSIS — Z9889 Other specified postprocedural states: Secondary | ICD-10-CM | POA: Diagnosis not present

## 2014-07-23 DIAGNOSIS — M545 Low back pain: Secondary | ICD-10-CM | POA: Diagnosis not present

## 2014-07-23 DIAGNOSIS — Z79899 Other long term (current) drug therapy: Secondary | ICD-10-CM | POA: Insufficient documentation

## 2014-07-23 DIAGNOSIS — Z8719 Personal history of other diseases of the digestive system: Secondary | ICD-10-CM | POA: Insufficient documentation

## 2014-07-23 DIAGNOSIS — I10 Essential (primary) hypertension: Secondary | ICD-10-CM | POA: Insufficient documentation

## 2014-07-23 DIAGNOSIS — M791 Myalgia: Secondary | ICD-10-CM | POA: Insufficient documentation

## 2014-07-23 DIAGNOSIS — R112 Nausea with vomiting, unspecified: Secondary | ICD-10-CM | POA: Insufficient documentation

## 2014-07-23 DIAGNOSIS — Z9089 Acquired absence of other organs: Secondary | ICD-10-CM | POA: Diagnosis not present

## 2014-07-23 DIAGNOSIS — Z7982 Long term (current) use of aspirin: Secondary | ICD-10-CM | POA: Insufficient documentation

## 2014-07-23 LAB — BASIC METABOLIC PANEL
ANION GAP: 11 (ref 5–15)
BUN: 11 mg/dL (ref 6–23)
CALCIUM: 9.3 mg/dL (ref 8.4–10.5)
CHLORIDE: 99 meq/L (ref 96–112)
CO2: 30 mEq/L (ref 19–32)
Creatinine, Ser: 0.6 mg/dL (ref 0.50–1.10)
GFR calc non Af Amer: >90 mL/min (ref >90)
Glucose, Bld: 91 mg/dL (ref 70–99)
Potassium: 3.9 mEq/L (ref 3.7–5.3)
Sodium: 140 mEq/L (ref 137–147)

## 2014-07-23 LAB — CBC WITH DIFFERENTIAL/PLATELET
BASOS ABS: 0 10*3/uL (ref 0.0–0.1)
BASOS PCT: 0 % (ref 0–1)
Eosinophils Absolute: 0.2 10*3/uL (ref 0.0–0.7)
Eosinophils Relative: 3 % (ref 0–5)
HCT: 41.3 % (ref 36.0–46.0)
Hemoglobin: 13.6 g/dL (ref 12.0–15.0)
Lymphocytes Relative: 42 % (ref 12–46)
Lymphs Abs: 3.5 10*3/uL (ref 0.7–4.0)
MCH: 31.1 pg (ref 26.0–34.0)
MCHC: 32.9 g/dL (ref 30.0–36.0)
MCV: 94.3 fL (ref 78.0–100.0)
Monocytes Absolute: 0.7 10*3/uL (ref 0.1–1.0)
Monocytes Relative: 9 % (ref 3–12)
NEUTROS ABS: 3.9 10*3/uL (ref 1.7–7.7)
NEUTROS PCT: 46 % (ref 43–77)
PLATELETS: 276 10*3/uL (ref 150–400)
RBC: 4.38 MIL/uL (ref 3.87–5.11)
RDW: 12.4 % (ref 11.5–15.5)
WBC: 8.4 10*3/uL (ref 4.0–10.5)

## 2014-07-23 LAB — URINALYSIS, ROUTINE W REFLEX MICROSCOPIC
Bilirubin Urine: NEGATIVE
Glucose, UA: NEGATIVE mg/dL
Hgb urine dipstick: NEGATIVE
KETONES UR: NEGATIVE mg/dL
LEUKOCYTES UA: NEGATIVE
NITRITE: NEGATIVE
PH: 5.5 (ref 5.0–8.0)
PROTEIN: NEGATIVE mg/dL
Specific Gravity, Urine: 1.019 (ref 1.005–1.030)
Urobilinogen, UA: 1 mg/dL (ref 0.0–1.0)

## 2014-07-23 MED ORDER — KETOROLAC TROMETHAMINE 30 MG/ML IJ SOLN
30.0000 mg | Freq: Once | INTRAMUSCULAR | Status: AC
  Administered 2014-07-23: 30 mg via INTRAVENOUS
  Filled 2014-07-23: qty 1

## 2014-07-23 MED ORDER — HYDROCODONE-ACETAMINOPHEN 5-325 MG PO TABS
1.0000 | ORAL_TABLET | ORAL | Status: DC | PRN
Start: 2014-07-23 — End: 2014-08-15

## 2014-07-23 NOTE — ED Notes (Signed)
Pt c/o left flank pain with n/v x 2 days

## 2014-07-23 NOTE — ED Provider Notes (Signed)
CSN: [id number]     Arrival date & time 07/23/14  1614 History  This chart was scribed for Gerhard Munchobert Matalyn Nawaz, MD by Richarda Overlieichard Holland, ED Scribe. This patient was seen in room MH02/MH02 and the patient's care was started 6:13 PM.    Chief Complaint  Patient presents with  . Flank Pain    The history is provided by the patient. No language interpreter was used.   HPI Comments: Johnney KillianBillie Jo Lewis is a 48 y.o. female with a history of pyelonephritis who presents to the Emergency Department complaining of constant, gradually worsening left lower back pain that has worsened the last 2 days. She reports the pain has been intermittent the past month. She reports associated nausea and vomiting as symptoms. She denies fever, CP, SOB, abdominal pain, dysuria and hematuria as symptoms.    Past Medical History  Diagnosis Date  . Palpitations     hx  . Hypertension     a. echo 7/11: mod LVH, EF 55-65%  . GERD (gastroesophageal reflux disease)   . Insomnia   . Anxiety   . Headache(784.0)   . History of cardiac cath 2007    cath 11/25/05 by Dr. Jenne CampusMcQueen with normal cors and EF 50%  . Chest pain     GXT echo 8/11: normal LVF, no ischemia;  h/o neg. myoview in 2005  . Carotid stenosis     Dopplers 6/12:0-39% bilateral; distal ICAs with marked tortuosity; CT angiogram recommended;  [facility]; surgery to correct LICA stenosis and pseudoaneurysm too risky - > medical Rx;  dopplers 8/13: B/L 0-39% (stable)  . 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 Prescott [facility]ashid Janj[facility]  . Fibromuscular dysplasia     affects left vertebral,  artery, left ICA, left renal artery;  head and neck CTA 10/24/11: stable pattern of fibromuscular dysplasia of ICA and VA with distal changes suggesting prior pseudoaneurysm and dissection; L dist ICA 60%; normal MRI 10/29/11;  [facility]  . Cerebral aneurysm   . Carotid artery injury    Past Surgical  History  Procedure Laterality Date  . Breast enhancement surgery  1991    Bilateral breast    Family History  Problem Relation Age of Onset  . Arthritis      family hx  . Breast cancer      relative ,50  . Diabetes      family hx  . Hypertension      family hx  . Kidney disease      family hx  . Lung cancer      family hx  . Coronary artery disease Mother   . Kidney disease Mother   . Heart disease Mother   . Heart attack Father [id number]    CABG  . Kidney failure Father   . Hypertension Father   . Heart disease Father    History  Substance Use Topics  . Smoking status: Never Smoker   . Smokeless tobacco: Never Used  . Alcohol Use: No   OB History   Grav Para Term Preterm Abortions TAB SAB Ect Mult Living                 Review of Systems  Constitutional:       Per HPI, otherwise negative  HENT:       Per HPI, otherwise negative  Respiratory: Negative for shortness of breath.  Per HPI, otherwise negative  Cardiovascular: Negative for chest pain.       Per HPI, otherwise negative  Gastrointestinal: Positive for nausea and vomiting.  Endocrine:       Negative aside from HPI  Genitourinary: Negative for dysuria and hematuria.       Neg aside from HPI   Musculoskeletal: Positive for back pain and myalgias.       Per HPI, otherwise negative  Skin: Negative.   Neurological: Negative for syncope.      Allergies  Amoxicillin and Sulfonamide derivatives  Home Medications   Prior to Admission medications   Medication Sig Start Date End Date Taking? Authorizing Provider  promethazine (PHENERGAN) 12.5 MG tablet Take 1 tablet (12.5 mg total) by mouth every 6 (six) hours as needed for nausea or vomiting. 11/04/13  Yes Estela Isaiah Blakes, MD  acetaminophen (TYLENOL) 500 MG tablet Take 1,000 mg by mouth every 6 (six) hours as needed for moderate pain.    Historical Provider, MD  ALPRAZolam (XANAX) 1 MG tablet TAKE 1 TABLET 3 TIMES DAILY. 03/07/14   Nelwyn Salisbury, MD  aspirin 81 MG tablet Take 81 mg by mouth daily.      Historical Provider, MD  cloNIDine (CATAPRES) 0.1 MG tablet Take 0.1 mg by mouth as needed (for high BP; takes along with lisinopril as needed).     Historical Provider, MD  cyclobenzaprine (FLEXERIL) 10 MG tablet take 1 tablet by mouth three times a day if needed for muscle spasm 06/29/13   Nelwyn Salisbury, MD  hydrochlorothiazide (HYDRODIURIL) 25 MG tablet TAKE 1 TABLET BY MOUTH ONCE DAILY 11/30/13   Rollene Rotunda, MD  HYDROcodone-acetaminophen (NORCO/VICODIN) 5-325 MG per tablet Take 1 tablet by mouth every 6 (six) hours as needed for moderate pain. 05/04/14   Nelwyn Salisbury, MD  lisinopril (PRINIVIL,ZESTRIL) 20 MG tablet Take 20-40 mg by mouth daily as needed (takes 20mg  at bedtime everyday, then another 20mg  as needed for high BP).     Historical Provider, MD  metoprolol succinate (TOPROL-XL) 25 MG 24 hr tablet Take 25 mg by mouth every morning.     Historical Provider, MD  metoprolol succinate (TOPROL-XL) 25 MG 24 hr tablet take 1 tablet by mouth once daily    Rollene Rotunda, MD  zolpidem (AMBIEN) 10 MG tablet take 1 tablet by mouth at bedtime if needed    Nelwyn Salisbury, MD   BP 121/70  Pulse 82  Temp(Src) 98.5 F (36.9 C) (Oral)  Resp 16  Ht 5\' 2"  (1.575 m)  Wt 148 lb (67.132 kg)  BMI 27.06 kg/m2  SpO2 99% Physical Exam  Nursing note and vitals reviewed. Constitutional: She is oriented to person, place, and time. She appears well-developed and well-nourished. No distress.  HENT:  Head: Normocephalic and atraumatic.  Eyes: Conjunctivae and EOM are normal.  Cardiovascular: Normal rate, regular rhythm and normal heart sounds.   Pulmonary/Chest: Effort normal and breath sounds normal. No stridor. No respiratory distress. She has no wheezes. She has no rales.  Abdominal: Soft. She exhibits no distension.  Musculoskeletal: She exhibits no edema.  Neurological: She is alert and oriented to person, place, and time. No cranial nerve  deficit.  Skin: Skin is warm and dry.  Psychiatric: She has a normal mood and affect.    ED Course  Procedures  DIAGNOSTIC STUDIES: Oxygen Saturation is 99% on RA, normal by my interpretation.    COORDINATION OF CARE: 6:17 PM Discussed treatment plan with  pt at bedside and pt agreed to plan.   Labs Review Labs Reviewed  URINALYSIS, ROUTINE W REFLEX MICROSCOPIC - Abnormal; Notable for the following:    APPearance CLOUDY (*)    All other components within normal limits  BASIC METABOLIC PANEL  CBC WITH DIFFERENTIAL    Imaging Review Ct Renal Degrasse Study  07/23/2014   CLINICAL DATA:  Left flank pain with history of pyelonephritis. Initial visit  EXAM: CT ABDOMEN AND PELVIS WITHOUT CONTRAST  TECHNIQUE: Multidetector CT imaging of the abdomen and pelvis was performed following the standard protocol without IV contrast.  COMPARISON:  Noncontrast CT scan of the abdomen and pelvis of November 01, 2013.  FINDINGS: The kidneys exhibit normal density and contour. There are no calcified stones. There is no hydronephrosis. The perinephric fat is normal in density. Along the course of the ureters no stones are demonstrated. The urinary bladder is unremarkable. The psoas musculature exhibits no acute abnormality. The uterus and adnexal structures are unremarkable.  The liver, gallbladder, pancreas, spleen, nondistended stomach, adrenal glands, and abdominal aorta are unremarkable. The periaortic and pericaval regions are normal. There is no intra-abdominal or pelvic lymphadenopathy or free fluid.  The small and large bowel exhibit no evidence of ileus, obstruction, nor acute inflammation. A normal appendix is demonstrated in the right aspect of the pelvis. There is no inguinal nor umbilical hernia. The subcutaneous and deeper soft tissues over the flanks are normal. There is mild dextro curvature of the thoracolumbar spine. There is no compression fracture nor high-grade disc space narrowing. The lung bases  are clear. There are bilateral breast implants.  IMPRESSION: 1. There is no urinary tract obstruction or evidence of stones. Noncontrast CT scanning is the fairly insensitive in terms of detecting pyelonephritis. 2. There is no acute hepatobiliary,bowel, or gynecological abnormality. 3. There is stable mild dextrocurvature of the thoracolumbar spine without significant degenerative change.   Electronically Signed   By: David  [place]   On: 07/23/2014 20:53    7:58 PM Patient in increasing pain.  CT ordered  9:45 PM On repeat exam the patient is in no distress.  She states that the pain medicine has helped  MDM    I personally performed the services described in this documentation, which was scribed in my presence. The recorded information has been reviewed and is accurate.   Patient presents with flank pain.  The patient's history of pyelonephritis, but is awake and alert, hemodynamically stable, afebrile. Patient has soft, no peritoneal abdomen.  Given the patient's history, CT scan was performed.  This was reassuring, as were the patient's labs. Patient improvement in her pain with Toradol. Patient was discharged in stable condition to follow up with primary care tomorrow, urine culture sent as well.   Gerhard Munch, MD 07/23/14 2147

## 2014-07-23 NOTE — Discharge Instructions (Signed)
As discussed, your evaluation today has been largely reassuring.  But, it is important that you monitor your condition carefully, and do not hesitate to return to the ED if you develop new, or concerning changes in your condition. ? ?Otherwise, please follow-up with your physician for appropriate ongoing care. ? ?

## 2014-07-23 NOTE — ED Notes (Signed)
Pt ambulated to bathroom without assistance.  No acute distress noted.

## 2014-07-26 ENCOUNTER — Ambulatory Visit (INDEPENDENT_AMBULATORY_CARE_PROVIDER_SITE_OTHER)
Admission: RE | Admit: 2014-07-26 | Discharge: 2014-07-26 | Disposition: A | Discharge status: Home / Self Care | Payer: BC Managed Care – PPO | Source: Ambulatory Visit | Attending: Family Medicine | Admitting: Family Medicine

## 2014-07-26 ENCOUNTER — Encounter: Payer: Self-pay | Admitting: Family Medicine

## 2014-07-26 ENCOUNTER — Ambulatory Visit (INDEPENDENT_AMBULATORY_CARE_PROVIDER_SITE_OTHER): Payer: BC Managed Care – PPO | Admitting: Family Medicine

## 2014-07-26 VITALS — BP 110/81 | HR 82 | Temp 98.1°F | Ht 62.0 in | Wt 150.0 lb

## 2014-07-26 DIAGNOSIS — M546 Pain in thoracic spine: Secondary | ICD-10-CM

## 2014-07-26 MED ORDER — METHYLPREDNISOLONE ACETATE 40 MG/ML IJ SUSP
160.0000 mg | Freq: Once | INTRAMUSCULAR | Status: AC
Start: 2014-07-26 — End: 2014-07-26
  Administered 2014-07-26: 160 mg via INTRAMUSCULAR

## 2014-07-26 MED ORDER — PROMETHAZINE HCL 25 MG PO TABS: 25.0000 mg | ORAL_TABLET | ORAL | Status: DC | PRN

## 2014-07-26 MED ORDER — CYCLOBENZAPRINE HCL 10 MG PO TABS: ORAL_TABLET | ORAL | Status: DC

## 2014-07-26 NOTE — Addendum Note (Signed)
Addended by: Aniceto BossNIMMONS, Sommer Spickard A on: 07/26/2014 09:27 AM   Modules accepted: Orders

## 2014-07-26 NOTE — Progress Notes (Signed)
Pre visit review using our clinic review tool, if applicable. No additional management support is needed unless otherwise documented below in the visit note. 

## 2014-07-26 NOTE — Progress Notes (Signed)
   Subjective:    Patient ID: Caroline Boone, female    DOB: [birth year], 48 y.o.   MRN: [id number]  HPI Here to follow up an ER visit on 07-23-14 for left flank pain. This started about 6 days ago and it is present constantly. It is a sharp pain just to the left of the spine that radiates around to the side. No cough or SOB. No fever. No urinary sx. No recent trauma. Using heat and Aleve and Vicodin. At the ER she had normal labs and a normal CT scan of the abdomen and pelvis. UA was clear. No etiology was identified and she was told to follow up with us. She has been working everyday despite the pain.   Review of Systems  Constitutional: Negative.   Gastrointestinal: Negative.   Genitourinary: Positive for flank pain. Negative for dysuria, urgency, frequency, hematuria and pelvic pain.  Musculoskeletal: Positive for back pain.       Objective:   Physical Exam  Constitutional: She appears well-developed and well-nourished. No distress.  Pulmonary/Chest: Effort normal and breath sounds normal.  Abdominal: Soft. Bowel sounds are normal. She exhibits no distension and no mass. There is no tenderness. There is no rebound and no guarding.  Musculoskeletal:  She is quite tender in the left middle back between the scapula and the spine. ROM of the spine is full but she has a lot of pain with bending laterally to the left           Assessment & Plan:  This is musculoskeletal pain, possibly from a pinched nerve. She will continue with Vicodin, Flexeril, and heat. Given a steroid shot today. Get Xrays of the thoracic spine.

## 2014-07-27 ENCOUNTER — Telehealth: Payer: Self-pay | Admitting: Family Medicine

## 2014-07-27 NOTE — Telephone Encounter (Signed)
I spoke with pt  

## 2014-07-27 NOTE — Telephone Encounter (Signed)
Pt would results of xrays done yesterday.

## 2014-08-15 ENCOUNTER — Telehealth: Payer: Self-pay | Admitting: Family Medicine

## 2014-08-15 MED ORDER — HYDROCODONE-ACETAMINOPHEN 5-325 MG PO TABS: 1.0000 | ORAL_TABLET | Freq: Four times a day (QID) | ORAL | Status: DC | PRN

## 2014-08-15 NOTE — Telephone Encounter (Signed)
done

## 2014-08-15 NOTE — Telephone Encounter (Signed)
Pt request refill HYDROcodone-acetaminophen (NORCO/VICODIN) 5-325 MG per tablet °

## 2014-08-17 NOTE — Telephone Encounter (Signed)
Script is ready for pick up and I spoke with pt.  

## 2014-09-07 ENCOUNTER — Encounter: Payer: Self-pay | Admitting: Cardiology

## 2014-09-07 ENCOUNTER — Ambulatory Visit (INDEPENDENT_AMBULATORY_CARE_PROVIDER_SITE_OTHER): Payer: BC Managed Care – PPO | Admitting: Cardiology

## 2014-09-07 VITALS — BP 110/83 | HR 74 | Ht 62.0 in | Wt 149.0 lb

## 2014-09-07 DIAGNOSIS — R002 Palpitations: Secondary | ICD-10-CM

## 2014-09-07 DIAGNOSIS — E785 Hyperlipidemia, unspecified: Secondary | ICD-10-CM

## 2014-09-07 NOTE — Progress Notes (Signed)
HPI The patient resents for follow up of chest pain and carotid pseudoaneurysm.  In 2013 she had a coronary CT angiogram which demonstrated a calcium score of 0 and no suggestion of coronary disease.  [facility] had [facility] for her carotid pseudoaneurysm and fibromuscular dysplasia. She apparently has stable anatomy.   [facility] has this [facility].    She presents today for followup. She's not sleeping well. He is very fatigued. She sometimes gets confused. She does have some chest discomfort. This has happened for a couple of weeks. Seems to be sporadic. It's a throbbing toothache discomfort that comes and goes at rest. She's not very active because she's so tired that she can bring it on with her usual daily activities. She's not had this before. There is no associated nausea vomiting or diaphoresis. She's had no presyncope or syncope. There is no new PND or orthopnea.  Allergies  Allergen Reactions  . Amoxicillin Nausea And Vomiting  . Sulfonamide Derivatives Hives    Current Outpatient Prescriptions  Medication Sig Dispense Refill  . acetaminophen (TYLENOL) 500 MG tablet Take 1,000 mg by mouth every 6 (six) hours as needed for moderate pain.    Marland Kitchen. ALPRAZolam (XANAX) 1 MG tablet TAKE 1 TABLET 3 TIMES DAILY. 270 tablet 1  . aspirin 81 MG tablet Take 81 mg by mouth daily.      . cloNIDine (CATAPRES) 0.1 MG tablet Take 0.1 mg by mouth as needed (for high BP; takes along with lisinopril as needed).     . cyclobenzaprine (FLEXERIL) 10 MG tablet take 1 tablet by mouth three times a day if needed for muscle spasm 90 tablet 5  . hydrochlorothiazide (HYDRODIURIL) 25 MG tablet TAKE 1 TABLET BY MOUTH ONCE DAILY 30 tablet 0  . HYDROcodone-acetaminophen (NORCO/VICODIN) 5-325 MG per tablet Take 1 tablet by mouth every 6 (six) hours as needed for moderate pain. 120 tablet 0  . lisinopril (PRINIVIL,ZESTRIL) 20 MG tablet Take 20-40 mg by mouth daily as needed (takes 20mg  at bedtime  everyday, then another 20mg  as needed for high BP).     . metoprolol succinate (TOPROL-XL) 25 MG 24 hr tablet Take 25 mg by mouth every morning.     . promethazine (PHENERGAN) 25 MG tablet Take 1 tablet (25 mg total) by mouth every 4 (four) hours as needed for nausea or vomiting. 60 tablet 5  . zolpidem (AMBIEN) 10 MG tablet take 1 tablet by mouth at bedtime if needed 30 tablet 5   No current facility-administered medications for this visit.    Past Medical History  Diagnosis Date  . Palpitations     hx  . Hypertension     a. echo 7/11: mod LVH, EF 55-65%  . GERD (gastroesophageal reflux disease)   . Insomnia   . Anxiety   . Headache(784.0)   . History of cardiac cath 2007    cath 11/25/05 by Dr. Jenne CampusMcQueen with normal cors and EF 50%  . Chest pain     GXT echo 8/11: normal LVF, no ischemia;  h/o neg. myoview in 2005  . Carotid stenosis     Dopplers 6/12:0-39% bilateral; distal ICAs with marked tortuosity; CT angiogram recommended;  [facility]; surgery to correct LICA stenosis and pseudoaneurysm too risky - > medical Rx;  dopplers 8/13: B/L 0-39% (stable)  . 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 Prescott [facility]id [facility]  .  Fibromuscular dysplasia     affects left vertebral,  artery, left ICA, left renal artery;  head and neck CTA 10/24/11: stable pattern of fibromuscular dysplasia of ICA and VA with distal changes suggesting prior pseudoaneurysm and dissection; L dist ICA 60%; normal MRI 10/29/11;  [facility] with [facility]  . Cerebral aneurysm   . Carotid artery injury     Past Surgical History  Procedure Laterality Date  . Breast enhancement surgery  1991    Bilateral breast     ROS:  As stated in the HPI and negative for all other systems.  PHYSICAL EXAM BP 110/83 mmHg  Pulse 74  Ht 5\' 2"  (1.575 m)  Wt 149 lb (67.586 kg)  BMI 27.25 kg/m2  LMP 06/14/2011 GENERAL:  Well appearing, looks tired NECK:  No  jugular venous distention, waveform within normal limits, carotid upstroke brisk and symmetric, loud left carotid bruit, no thyromegaly LYMPHATICS:  No cervical, inguinal adenopathy LUNGS:  Clear to auscultation bilaterally CHEST:  Unremarkable HEART:  PMI not displaced or sustained,S1 and S2 within normal limits, no S3, no S4, no clicks, no rubs, no murmurs ABD:  Flat, positive bowel sounds normal in frequency in pitch, no bruits, no rebound, no guarding, no midline pulsatile mass, no hepatomegaly, no splenomegaly EXT:  2 plus pulses throughout, no edema, no cyanosis no clubbing  EKG:  Sinus rhythm, rate 74, axis within normal limits, intervals within normal limits, no acute ST-T wave changes.  09/07/2014  ASSESSMENT AND PLAN  Chest pain - This is very atypical.  I think the pretest probability of obstructive coronary disease is very low. I don't think further cardiovascular testing is suggested.  HYPERTENSION -  Her blood pressure is well controlled. She will continue the meds as listed.  Fibromuscular dysplasia -  This is now being [facility].  We will forego any further Dopplers as they are doing the imaging.  Fatigue - I asked her to make an appt with Dr. Clent RidgesFry to talk about her insomnia.   Risk reduction - She will have a lipid profile.

## 2014-09-07 NOTE — Patient Instructions (Signed)
Your physician recommends that you schedule a follow-up appointment in: 2 years With Dr. Antoine PocheHochrein  We are ordering labs for you to get done

## 2014-10-08 ENCOUNTER — Other Ambulatory Visit: Payer: Self-pay | Admitting: Family Medicine

## 2014-10-08 MED ORDER — ZOLPIDEM TARTRATE 10 MG PO TABS: ORAL_TABLET | ORAL | Status: DC

## 2014-10-08 NOTE — Telephone Encounter (Signed)
Called to the pharmacy and left on machine. 

## 2014-10-08 NOTE — Telephone Encounter (Signed)
Call in #[phone] with 5 rf 

## 2014-10-16 ENCOUNTER — Other Ambulatory Visit: Payer: Self-pay | Admitting: Family Medicine

## 2014-10-16 NOTE — Telephone Encounter (Signed)
Call in #[phone] with one rf 

## 2014-11-02 ENCOUNTER — Telehealth: Payer: Self-pay | Admitting: Family Medicine

## 2014-11-02 MED ORDER — HYDROCODONE-ACETAMINOPHEN 5-325 MG PO TABS: 1.0000 | ORAL_TABLET | Freq: Four times a day (QID) | ORAL | Status: DC | PRN

## 2014-11-02 NOTE — Telephone Encounter (Signed)
done

## 2014-11-02 NOTE — Telephone Encounter (Signed)
Pt needs new rx hydrocodone °

## 2014-11-05 NOTE — Telephone Encounter (Signed)
Script is ready for pick up and I left a voice message for pt. 

## 2014-11-14 ENCOUNTER — Encounter: Payer: Self-pay | Admitting: Family Medicine

## 2014-11-14 ENCOUNTER — Ambulatory Visit (INDEPENDENT_AMBULATORY_CARE_PROVIDER_SITE_OTHER): Payer: BLUE CROSS/BLUE SHIELD | Admitting: Family Medicine

## 2014-11-14 VITALS — BP 118/77 | HR 77 | Temp 98.9°F | Ht 62.0 in | Wt 148.0 lb

## 2014-11-14 DIAGNOSIS — J0100 Acute maxillary sinusitis, unspecified: Secondary | ICD-10-CM

## 2014-11-14 MED ORDER — CLARITHROMYCIN 500 MG PO TABS: 500.0000 mg | ORAL_TABLET | Freq: Two times a day (BID) | ORAL | Status: DC

## 2014-11-14 MED ORDER — HYDROCODONE-HOMATROPINE 5-1.5 MG/5ML PO SYRP: 5.0000 mL | ORAL_SOLUTION | ORAL | Status: DC | PRN

## 2014-11-14 NOTE — Progress Notes (Signed)
Pre visit review using our clinic review tool, if applicable. No additional management support is needed unless otherwise documented below in the visit note. 

## 2014-11-14 NOTE — Progress Notes (Signed)
   Subjective:    Patient ID: Caroline Boone, female    DOB: 03/06/1966, 49 y.o.   MRN: [id number]  HPI Here for 3 weeks of sinus pressure and PND, causing a dry cough. Using Delsym and Mucinex.    Review of Systems  Constitutional: Negative.   HENT: Positive for congestion, postnasal drip and sinus pressure.   Eyes: Negative.   Respiratory: Positive for cough.        Objective:   Physical Exam  Constitutional: She appears well-developed and well-nourished.  HENT:  Right Ear: External ear normal.  Left Ear: External ear normal.  Nose: Nose normal.  Mouth/Throat: Oropharynx is clear and moist.  Eyes: Conjunctivae are normal.  Pulmonary/Chest: Effort normal and breath sounds normal.  Lymphadenopathy:    She has no cervical adenopathy.          Assessment & Plan:  Recheck prn

## 2015-02-04 ENCOUNTER — Telehealth: Payer: Self-pay | Admitting: Family Medicine

## 2015-02-04 MED ORDER — HYDROCODONE-ACETAMINOPHEN 5-325 MG PO TABS: 1.0000 | ORAL_TABLET | Freq: Four times a day (QID) | ORAL | Status: DC | PRN

## 2015-02-04 NOTE — Telephone Encounter (Signed)
Pt needs refill on vicodin.

## 2015-02-04 NOTE — Telephone Encounter (Signed)
Script is ready for pick up and I left a voice message.  

## 2015-02-04 NOTE — Telephone Encounter (Signed)
done

## 2015-03-15 ENCOUNTER — Other Ambulatory Visit: Payer: Self-pay | Admitting: Family Medicine

## 2015-03-15 NOTE — Telephone Encounter (Signed)
Refill for 6 months. 

## 2015-05-01 ENCOUNTER — Telehealth: Payer: Self-pay | Admitting: Family Medicine

## 2015-05-01 NOTE — Telephone Encounter (Signed)
Pt request refill of the following: HYDROcodone-acetaminophen (NORCO/VICODIN) 5-325 MG per tablet ° ° °Phamacy: °

## 2015-05-02 MED ORDER — HYDROCODONE-ACETAMINOPHEN 5-325 MG PO TABS: 1.0000 | ORAL_TABLET | Freq: Four times a day (QID) | ORAL | Status: DC | PRN

## 2015-05-02 NOTE — Telephone Encounter (Signed)
done

## 2015-05-03 NOTE — Telephone Encounter (Signed)
Script is ready for pick up and I spoke with pt.  

## 2015-06-06 ENCOUNTER — Other Ambulatory Visit: Payer: Self-pay | Admitting: Family Medicine

## 2015-06-07 NOTE — Telephone Encounter (Signed)
Call in #[phone] with 1 rf  

## 2015-06-13 ENCOUNTER — Telehealth: Payer: Self-pay | Admitting: Family Medicine

## 2015-06-13 NOTE — Telephone Encounter (Signed)
Pt having depression and states there are some thing she needs to talk about w/ dr fry.  Needed first appt of the day (or last) Pt also has rt foot swelling she wants to add to the appt.  Do you want me to keep this a 30 min or is 15 ok?  appt 8/30

## 2015-06-14 NOTE — Telephone Encounter (Signed)
Keep it a 30 min appt please

## 2015-06-14 NOTE — Telephone Encounter (Signed)
Done

## 2015-06-18 ENCOUNTER — Ambulatory Visit (INDEPENDENT_AMBULATORY_CARE_PROVIDER_SITE_OTHER): Payer: 59 | Admitting: Family Medicine

## 2015-06-18 ENCOUNTER — Encounter: Payer: Self-pay | Admitting: Family Medicine

## 2015-06-18 VITALS — BP 107/71 | HR 74 | Temp 98.5°F | Ht 62.0 in | Wt 156.0 lb

## 2015-06-18 DIAGNOSIS — F329 Major depressive disorder, single episode, unspecified: Secondary | ICD-10-CM

## 2015-06-18 DIAGNOSIS — F32A Depression, unspecified: Secondary | ICD-10-CM

## 2015-06-18 DIAGNOSIS — M545 Low back pain, unspecified: Secondary | ICD-10-CM | POA: Insufficient documentation

## 2015-06-18 LAB — POCT URINALYSIS DIPSTICK
Bilirubin, UA: NEGATIVE
Blood, UA: NEGATIVE
Glucose, UA: NEGATIVE
KETONES UA: NEGATIVE
Leukocytes, UA: NEGATIVE
Nitrite, UA: NEGATIVE
PROTEIN UA: NEGATIVE
Spec Grav, UA: 1.015
Urobilinogen, UA: 0.2
pH, UA: 6

## 2015-06-18 MED ORDER — BUPROPION HCL ER (XL) 150 MG PO TB24: 150.0000 mg | ORAL_TABLET | Freq: Every day | ORAL | Status: DC

## 2015-06-18 NOTE — Progress Notes (Signed)
Pre visit review using our clinic review tool, if applicable. No additional management support is needed unless otherwise documented below in the visit note. 

## 2015-06-18 NOTE — Addendum Note (Signed)
Addended by: Aniceto Boss A on: 06/18/2015 09:59 AM   Modules accepted: Orders

## 2015-06-18 NOTE — Progress Notes (Signed)
   Subjective:    Patient ID: Caroline Boone, female    DOB: 20-May-1966, 49 y.o.   MRN: [id number]  HPI Here to discuss several issues. First she thinks she is depressed. For 6 months or so she has felt sad, tired, unmotivated, and hopeless. Her appetite is normal but her sleep is poor. She has been in a relationship with a man for the past 7 years that she wants to get out of. She thinks he may be an alcoholic and she is not happy with things. Also she has had more low back pain lately and wants to have her urine checked. No burning or urgency.    Review of Systems  Constitutional: Negative.   Respiratory: Negative.   Cardiovascular: Negative.   Gastrointestinal: Negative.   Genitourinary: Negative.   Musculoskeletal: Positive for back pain.  Psychiatric/Behavioral: Positive for dysphoric mood and decreased concentration. Negative for suicidal ideas, hallucinations, behavioral problems, confusion, sleep disturbance, self-injury and agitation. The patient is nervous/anxious.        Objective:   Physical Exam  Constitutional: She is oriented to person, place, and time. She appears well-developed and well-nourished.  Cardiovascular: Normal rate, regular rhythm, normal heart sounds and intact distal pulses.   Pulmonary/Chest: Effort normal and breath sounds normal.  Abdominal: Soft. Bowel sounds are normal. She exhibits no distension and no mass. There is no tenderness. There is no rebound and no guarding.  Neurological: She is alert and oriented to person, place, and time.  Psychiatric: Her behavior is normal. Judgment and thought content normal.  Depressed affect, tearful           Assessment & Plan:  She is certainly depressed so we will start her on Wellbutrin XL. She plans to attend some [employer] meetings. Her UA is clear so the back pain seems to be her usual degenerative pain. She can use her usual pain meds. Recheck in 3 weeks

## 2015-07-29 ENCOUNTER — Telehealth: Payer: Self-pay | Admitting: Family Medicine

## 2015-07-29 NOTE — Telephone Encounter (Addendum)
Error

## 2015-07-29 NOTE — Telephone Encounter (Signed)
Pt request refill HYDROcodone-acetaminophen (NORCO/VICODIN) 5-325 MG per tablet Ok to leave message when ready

## 2015-07-30 MED ORDER — HYDROCODONE-ACETAMINOPHEN 5-325 MG PO TABS: 1.0000 | ORAL_TABLET | Freq: Four times a day (QID) | ORAL | Status: DC | PRN

## 2015-07-30 NOTE — Telephone Encounter (Signed)
Script is ready for pick up and I spoke with pt.  

## 2015-07-30 NOTE — Telephone Encounter (Signed)
done

## 2015-08-22 ENCOUNTER — Other Ambulatory Visit: Payer: Self-pay | Admitting: Family Medicine

## 2015-08-23 NOTE — Telephone Encounter (Signed)
Call in #[phone] with 5 rf 

## 2015-10-24 ENCOUNTER — Ambulatory Visit: Payer: 59 | Admitting: Podiatry

## 2015-10-28 ENCOUNTER — Ambulatory Visit: Payer: 59 | Admitting: Podiatry

## 2015-11-07 ENCOUNTER — Ambulatory Visit (INDEPENDENT_AMBULATORY_CARE_PROVIDER_SITE_OTHER): Payer: 59 | Admitting: Podiatry

## 2015-11-07 ENCOUNTER — Ambulatory Visit (INDEPENDENT_AMBULATORY_CARE_PROVIDER_SITE_OTHER): Payer: 59

## 2015-11-07 ENCOUNTER — Encounter: Payer: Self-pay | Admitting: Podiatry

## 2015-11-07 VITALS — BP 108/68 | HR 74 | Resp 16

## 2015-11-07 DIAGNOSIS — M779 Enthesopathy, unspecified: Secondary | ICD-10-CM

## 2015-11-07 MED ORDER — TRIAMCINOLONE ACETONIDE 10 MG/ML IJ SUSP
10.0000 mg | Freq: Once | INTRAMUSCULAR | Status: AC
  Administered 2015-11-07: 10 mg

## 2015-11-08 NOTE — Progress Notes (Signed)
Subjective:     Patient ID: Caroline Boone, female   DOB: 04/04/1966, 50 y.o.   MRN: [id number]  HPI patient presents with a lot of pain on the inside of the right foot stating it's been hurting her for a while and worse over the last 6 months. Does not remember injury   Review of Systems  All other systems reviewed and are negative.      Objective:   Physical Exam  Constitutional: She is oriented to person, place, and time.  Cardiovascular: Intact distal pulses.   Musculoskeletal: Normal range of motion.  Neurological: She is oriented to person, place, and time.  Skin: Skin is warm.  Nursing note and vitals reviewed.  neurovascular status found to be intact muscle strength adequate range of motion within normal limits with patient noted to have discomfort on the right navicular cuneiform joint with inflammation fluid buildup and pain when palpated. Patient's noted to have good digital perfusion and is well oriented 3     Assessment:     Inflammatory discomfort of the navicular cuneiform joint right with arthritis    Plan:     H&P and x-rays reviewed with patient and today I went ahead and I explained the fact it may ultimately require fusion or to try to hold off and I injected around the joint 3 mg Kenalog 5 mg Xylocaine

## 2015-11-11 ENCOUNTER — Other Ambulatory Visit: Payer: Self-pay | Admitting: Family Medicine

## 2015-11-11 ENCOUNTER — Telehealth: Payer: Self-pay | Admitting: *Deleted

## 2015-11-11 MED ORDER — HYDROCODONE-ACETAMINOPHEN 5-325 MG PO TABS: 1.0000 | ORAL_TABLET | Freq: Four times a day (QID) | ORAL | Status: DC | PRN

## 2015-11-11 NOTE — Telephone Encounter (Signed)
Pt request refill  °HYDROcodone-acetaminophen (NORCO/VICODIN) 5-325 MG tablet °

## 2015-11-11 NOTE — Telephone Encounter (Signed)
done

## 2015-11-11 NOTE — Telephone Encounter (Signed)
Pt states she received a cortisone shot and now the area is red and swollen.  I told pt to begin ice 3-4 times for 10-15 minutes daily at least.  Pt states she was doing fine until she turned to step in the kitchen and the she had a terrible sharp pain in the top of her foot.  I offered her an appt and she said she would like to wait and do the icing, that she doesn't really think she's done anything.

## 2015-11-12 NOTE — Telephone Encounter (Signed)
Patient notified that Rx is ready for pick up.

## 2015-11-27 ENCOUNTER — Encounter: Payer: Self-pay | Admitting: Family Medicine

## 2015-11-27 ENCOUNTER — Ambulatory Visit (INDEPENDENT_AMBULATORY_CARE_PROVIDER_SITE_OTHER): Payer: 59 | Admitting: Family Medicine

## 2015-11-27 VITALS — BP 120/82 | HR 77 | Temp 98.2°F | Ht 62.0 in | Wt 152.7 lb

## 2015-11-27 DIAGNOSIS — R002 Palpitations: Secondary | ICD-10-CM

## 2015-11-27 DIAGNOSIS — I773 Arterial fibromuscular dysplasia: Secondary | ICD-10-CM

## 2015-11-27 DIAGNOSIS — F411 Generalized anxiety disorder: Secondary | ICD-10-CM

## 2015-11-27 DIAGNOSIS — I10 Essential (primary) hypertension: Secondary | ICD-10-CM

## 2015-11-27 NOTE — Progress Notes (Signed)
   Subjective:    Patient ID: Caroline Boone, female    DOB: 19-Jul-1966, 50 y.o.   MRN: [id number]  HPI Here for several reasons. First she recently had some labs drawn per Dr. Hyman Hopes showing a slightly elevated CO2 of 30. Otherwise these were normal, renal function was normal. She was alarmed and wanted to ask if she should be worried about this. Also she has been having some occasional palpitations in the chest again. No chest pain or SOB. She has not seen Dr. Antoine Poche for about a year and a half.   Review of Systems  Constitutional: Negative.   Respiratory: Negative.   Cardiovascular: Positive for palpitations. Negative for chest pain and leg swelling.  Gastrointestinal: Negative.   Neurological: Negative.        Objective:   Physical Exam  Constitutional: She is oriented to person, place, and time. She appears well-developed and well-nourished. No distress.  Cardiovascular: Normal rate, regular rhythm, normal heart sounds and intact distal pulses.   No murmur heard. Pulmonary/Chest: Effort normal and breath sounds normal. No respiratory distress. She has no wheezes. She has no rales. She exhibits no tenderness.  Neurological: She is alert and oriented to person, place, and time.  Psychiatric:  Somewhat anxious           Assessment & Plan:  I reassured her that her lab results were quite unremarkable and within normal variations. Her palpitations are most likely from anxiety but I encouraged her to follow up with Dr. Antoine Poche again soon

## 2015-11-27 NOTE — Progress Notes (Signed)
Pre visit review using our clinic review tool, if applicable. No additional management support is needed unless otherwise documented below in the visit note. 

## 2015-12-05 ENCOUNTER — Ambulatory Visit (INDEPENDENT_AMBULATORY_CARE_PROVIDER_SITE_OTHER): Payer: 59 | Admitting: Podiatry

## 2015-12-05 DIAGNOSIS — L600 Ingrowing nail: Secondary | ICD-10-CM

## 2015-12-05 DIAGNOSIS — M779 Enthesopathy, unspecified: Secondary | ICD-10-CM

## 2015-12-05 NOTE — Patient Instructions (Addendum)

## 2015-12-06 NOTE — Progress Notes (Signed)
Subjective:     Patient ID: Caroline Boone, female   DOB: 07-26-1966, 50 y.o.   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 At this point I recommended removal of the border and explained procedure and risk. Patient wants surgery and today I infiltrated the right hallux 60 mg Xylocaine Marcaine mixture and remove the medial border exposing the matrix and applying phenol 3 applications 30 seconds followed by alcohol lavage and sterile dressing. Gave instructions on soaks and reappoint

## 2015-12-16 ENCOUNTER — Telehealth: Payer: Self-pay | Admitting: *Deleted

## 2015-12-16 NOTE — Telephone Encounter (Signed)
Called patient at [phone] (Home #) to check to see how they were doing from their ingrown toenail procedure that was performed on Thursday, December 05, 2015. Pt stated, "Doing better and has no pain".

## 2016-01-08 ENCOUNTER — Telehealth: Payer: Self-pay | Admitting: Family Medicine

## 2016-01-08 NOTE — Telephone Encounter (Signed)
Pt states her employer has ordered the culture, and now no need , but pt still would like to have a Rx.

## 2016-01-08 NOTE — Telephone Encounter (Signed)
Call in Cipro 500 mg bid for 7 days  

## 2016-01-08 NOTE — Telephone Encounter (Signed)
Pt works at [employer]. Pt had labs done there for a UTI. The lab there states she is positive UTI.   Pt states it was 3+blood / 2+ leukocytes  Pt will fax results  Pt would like to know if you will send order to have her urine cultured there.  ([facility]) Fax  [phone]  BUT in the meantime, pt would like to know if Dr Clent RidgesFry will send in RX for her UTI?  [facility] [address]

## 2016-01-09 MED ORDER — CIPROFLOXACIN HCL 500 MG PO TABS: 500.0000 mg | ORAL_TABLET | Freq: Two times a day (BID) | ORAL | Status: DC

## 2016-01-09 NOTE — Telephone Encounter (Signed)
Rx was send to the pharmacy  

## 2016-01-21 ENCOUNTER — Telehealth: Payer: Self-pay | Admitting: Family Medicine

## 2016-01-21 MED ORDER — ALPRAZOLAM 1 MG PO TABS: 1.0000 mg | ORAL_TABLET | Freq: Three times a day (TID) | ORAL | Status: DC

## 2016-01-21 NOTE — Telephone Encounter (Signed)
Call in #[phone] with one rf 

## 2016-01-21 NOTE — Telephone Encounter (Signed)
Rx was send to the pharmacy  

## 2016-01-21 NOTE — Telephone Encounter (Signed)
Refill request for Alprazolam 1 mg take 1 po tid and send to [facility].

## 2016-02-10 ENCOUNTER — Telehealth: Payer: Self-pay | Admitting: Family Medicine

## 2016-02-10 NOTE — Telephone Encounter (Signed)
Refill request for Zolpidem tartrate 10 mg and send to [facility].

## 2016-02-11 NOTE — Telephone Encounter (Signed)
Call in #[phone] with 5 rf 

## 2016-02-12 MED ORDER — ZOLPIDEM TARTRATE 10 MG PO TABS: ORAL_TABLET | ORAL | Status: DC

## 2016-02-12 NOTE — Telephone Encounter (Signed)
Rx has been call in  

## 2016-03-03 ENCOUNTER — Telehealth: Payer: Self-pay | Admitting: Family Medicine

## 2016-03-03 MED ORDER — HYDROCODONE-ACETAMINOPHEN 5-325 MG PO TABS: 1.0000 | ORAL_TABLET | Freq: Four times a day (QID) | ORAL | Status: DC | PRN

## 2016-03-03 NOTE — Telephone Encounter (Signed)
Pt need new Rx for Vicodin °

## 2016-03-03 NOTE — Telephone Encounter (Signed)
Script is ready for pick up and I left a voice message for pt. 

## 2016-03-03 NOTE — Telephone Encounter (Signed)
done

## 2016-03-05 ENCOUNTER — Other Ambulatory Visit: Payer: Self-pay

## 2016-03-05 DIAGNOSIS — Z1231 Encounter for screening mammogram for malignant neoplasm of breast: Secondary | ICD-10-CM

## 2016-03-24 ENCOUNTER — Ambulatory Visit: Payer: 59

## 2016-03-30 ENCOUNTER — Ambulatory Visit
Admission: RE | Admit: 2016-03-30 | Discharge: 2016-03-30 | Disposition: A | Discharge status: Home / Self Care | Payer: 59 | Source: Ambulatory Visit

## 2016-03-30 DIAGNOSIS — Z1231 Encounter for screening mammogram for malignant neoplasm of breast: Secondary | ICD-10-CM

## 2016-03-31 ENCOUNTER — Other Ambulatory Visit: Payer: Self-pay | Admitting: Obstetrics

## 2016-03-31 DIAGNOSIS — N63 Unspecified lump in unspecified breast: Secondary | ICD-10-CM

## 2016-04-02 ENCOUNTER — Ambulatory Visit
Admission: RE | Admit: 2016-04-02 | Discharge: 2016-04-02 | Disposition: A | Discharge status: Home / Self Care | Payer: 59 | Source: Ambulatory Visit | Attending: Obstetrics | Admitting: Obstetrics

## 2016-04-02 DIAGNOSIS — N63 Unspecified lump in unspecified breast: Secondary | ICD-10-CM

## 2016-05-01 ENCOUNTER — Encounter: Payer: 59 | Admitting: Internal Medicine

## 2016-05-01 NOTE — Progress Notes (Signed)
Document opened and reviewed for OV but appt  canceled same day .  

## 2016-06-10 ENCOUNTER — Ambulatory Visit (INDEPENDENT_AMBULATORY_CARE_PROVIDER_SITE_OTHER): Payer: 59 | Admitting: Family Medicine

## 2016-06-10 VITALS — BP 120/92 | HR 105 | Temp 98.0°F | Ht 62.0 in | Wt 155.0 lb

## 2016-06-10 DIAGNOSIS — R42 Dizziness and giddiness: Secondary | ICD-10-CM

## 2016-06-10 DIAGNOSIS — R5383 Other fatigue: Secondary | ICD-10-CM | POA: Diagnosis not present

## 2016-06-10 DIAGNOSIS — I959 Hypotension, unspecified: Secondary | ICD-10-CM | POA: Diagnosis not present

## 2016-06-10 NOTE — Progress Notes (Signed)
Subjective:     Patient ID: Caroline Boone, female   DOB: [birth year], 50 y.o.   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 She is seen today with multiple nonspecific symptoms. She's had some recent progressive fatigue. She has noted somewhat low blood pressures for the past week. Today after eating lunch had some nausea and followed by diaphoresis and lightheadedness. No syncope. No chest pains. She has history of migraine headaches and recently had more frequent left retro-orbital headache with some radiation occipital. Denies any fever. No dysuria. No sinusitis symptoms. No diarrhea. No cough. She has some mid thoracic back pain which apparently has been somewhat chronic and intermittent. Denies recent injury. No pleuritic pain.  Her daily usual medications include HCTZ, lisinopril, and metoprolol. She's not had any recent lab work. She held her lisinopril about a week ago but still been taking HCTZ and metoprolol. She has frequent lightheadedness with standing but no syncope. No abdominal pain. Denies any recent fevers or chills  Past Medical History:  Diagnosis Date  . Anxiety   . Carotid artery injury   . Carotid stenosis    Dopplers 6/12:0-39% bilateral; distal ICAs with marked tortuosity; CT angiogram recommended;  [facility]; surgery to correct LICA stenosis and pseudoaneurysm too risky - > medical Rx;  dopplers 8/13: B/L 0-39% (stable)  . Cerebral aneurysm   . Chest pain    GXT echo 8/11: normal LVF, no ischemia;  h/o neg. myoview in 2005  . Fibromuscular dysplasia (HCC)    affects left vertebral,  artery, left ICA, left renal artery;  head and neck CTA 10/24/11: stable pattern of fibromuscular dysplasia of ICA and VA with  distal changes suggesting prior pseudoaneurysm and dissection; L dist ICA 60%; normal MRI 10/29/11;  [facility]  . GERD (gastroesophageal reflux disease)   . Headache(784.0)   . History of cardiac cath 2007   cath 11/25/05 by Dr. Jenne CampusMcQueen with normal cors and EF 50%  . Hypertension    a. echo 7/11: mod LVH, EF 55-65%  . Insomnia   . Palpitations    hx  . 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 Prescott [facility]id [facility]   Past Surgical History:  Procedure Laterality Date  . BREAST ENHANCEMENT SURGERY  1991   Bilateral breast     reports that she has never smoked. She has never used smokeless tobacco. She reports that she does not drink alcohol or use drugs. family history includes Coronary artery disease in her mother; Heart attack (age of onset: 4871) in her father; Heart disease in her father and mother; Hypertension in her father; Kidney disease in her mother; Kidney failure in her father. Allergies  Allergen Reactions  . Amoxicillin Nausea And Vomiting  . Sulfonamide Derivatives Hives     Review of Systems  Constitutional: Positive for fatigue. Negative for chills and fever.  Respiratory: Negative for cough and shortness of breath.   Cardiovascular: Negative for chest pain, palpitations and leg swelling.  Gastrointestinal: Positive for nausea. Negative for abdominal pain, blood in stool, constipation, diarrhea and vomiting.  Genitourinary: Negative for dysuria.  Musculoskeletal: Positive for back pain.  Skin: Negative for rash.  Neurological: Positive for light-headedness and  headaches. Negative for syncope.  Hematological: Negative for adenopathy. Does not bruise/bleed easily.  Psychiatric/Behavioral: Negative for confusion.       Objective:   Physical Exam  Constitutional: She is oriented to person, place, and time. She appears well-developed and well-nourished. No distress.  HENT:  Right Ear:  External ear normal.  Left Ear: External ear normal.  Mouth/Throat: Oropharynx is clear and moist.  Neck: Neck supple. No thyromegaly present.  Cardiovascular: Normal rate and regular rhythm.   Pulmonary/Chest: Effort normal and breath sounds normal. No respiratory distress. She has no wheezes. She has no rales.  Abdominal: Soft. There is no tenderness. There is no rebound and no guarding.  Musculoskeletal: She exhibits no edema.  Neurological: She is alert and oriented to person, place, and time.       Assessment:     #1 history of hypertension with somewhat low blood pressure past week. Standing blood pressure today left arm 100/70 and we obtained similar readings sitting- so low BP for her but no orthostatic drop.  #2 nonspecific fatigue  #3 history of pseudoaneurysm left internal carotid and vertebral vessels  #4 nonspecific lightheadedness with no demonstrated orthostatic change    Plan:     -Check further labs with TSH, CBC, comp metabolic panel. 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 -She is encouraged to follow-up with her neurosurgeon if headaches persist  Kristian CoveyBruce W Ramere Downs MD [facility]

## 2016-06-10 NOTE — Progress Notes (Signed)
Pre visit review using our clinic review tool, if applicable. No additional management support is needed unless otherwise documented below in the visit note. 

## 2016-06-10 NOTE — Patient Instructions (Signed)
Stay well hydrated Hold HCTZ for systolic BP < 100.

## 2016-06-11 ENCOUNTER — Other Ambulatory Visit (INDEPENDENT_AMBULATORY_CARE_PROVIDER_SITE_OTHER): Payer: 59

## 2016-06-11 ENCOUNTER — Telehealth: Payer: Self-pay | Admitting: Family Medicine

## 2016-06-11 DIAGNOSIS — R946 Abnormal results of thyroid function studies: Secondary | ICD-10-CM

## 2016-06-11 DIAGNOSIS — I10 Essential (primary) hypertension: Secondary | ICD-10-CM

## 2016-06-11 DIAGNOSIS — D649 Anemia, unspecified: Secondary | ICD-10-CM

## 2016-06-11 LAB — CBC WITH DIFFERENTIAL/PLATELET
BASOS ABS: 0 10*3/uL (ref 0.0–0.1)
Basophils Relative: 0.6 % (ref 0.0–3.0)
EOS ABS: 0.2 10*3/uL (ref 0.0–0.7)
Eosinophils Relative: 1.9 % (ref 0.0–5.0)
HCT: 39.7 % (ref 36.0–46.0)
Hemoglobin: 13.6 g/dL (ref 12.0–15.0)
LYMPHS ABS: 2.3 10*3/uL (ref 0.7–4.0)
Lymphocytes Relative: 27.6 % (ref 12.0–46.0)
MCHC: 34.3 g/dL (ref 30.0–36.0)
MCV: 91.7 fl (ref 78.0–100.0)
MONO ABS: 0.6 10*3/uL (ref 0.1–1.0)
Monocytes Relative: 7.2 % (ref 3.0–12.0)
NEUTROS ABS: 5.3 10*3/uL (ref 1.4–7.7)
NEUTROS PCT: 62.7 % (ref 43.0–77.0)
PLATELETS: 306 10*3/uL (ref 150.0–400.0)
RBC: 4.33 Mil/uL (ref 3.87–5.11)
RDW: 12.5 % (ref 11.5–15.5)
WBC: 8.4 10*3/uL (ref 4.0–10.5)

## 2016-06-11 LAB — COMPREHENSIVE METABOLIC PANEL
ALT: 13 U/L (ref 0–35)
AST: 17 U/L (ref 0–37)
Albumin: 4.1 g/dL (ref 3.5–5.2)
Alkaline Phosphatase: 74 U/L (ref 39–117)
BILIRUBIN TOTAL: 0.5 mg/dL (ref 0.2–1.2)
BUN: 14 mg/dL (ref 6–23)
CO2: 29 meq/L (ref 19–32)
Calcium: 9.1 mg/dL (ref 8.4–10.5)
Chloride: 105 mEq/L (ref 96–112)
Creatinine, Ser: 0.65 mg/dL (ref 0.40–1.20)
GFR: 101.66 mL/min (ref >60.00)
Glucose, Bld: 83 mg/dL (ref 70–99)
Potassium: 3.8 mEq/L (ref 3.5–5.1)
SODIUM: 141 meq/L (ref 135–145)
Total Protein: 6.8 g/dL (ref 6.0–8.3)

## 2016-06-11 LAB — TSH: TSH: 0.5 u[IU]/mL (ref 0.35–4.50)

## 2016-06-11 NOTE — Telephone Encounter (Signed)
Pt needs new rx hydrocodone °

## 2016-06-12 MED ORDER — HYDROCODONE-ACETAMINOPHEN 5-325 MG PO TABS: 1.0000 | ORAL_TABLET | Freq: Four times a day (QID) | ORAL | 0 refills | Status: DC | PRN

## 2016-06-12 NOTE — Telephone Encounter (Signed)
Script is ready for pick up here at front office and I spoke with pt.  

## 2016-06-12 NOTE — Telephone Encounter (Signed)
done

## 2016-07-21 ENCOUNTER — Other Ambulatory Visit: Payer: Self-pay | Admitting: Family Medicine

## 2016-07-21 NOTE — Telephone Encounter (Signed)
Call in #[phone] with one rf 

## 2016-07-24 ENCOUNTER — Telehealth: Payer: Self-pay | Admitting: Family Medicine

## 2016-07-24 NOTE — Telephone Encounter (Signed)
Pt needs new rx  Alprazolam 1 mg #90 w/refills [facility] [address]. [facility] pharm. [facility] [address]

## 2016-07-24 NOTE — Telephone Encounter (Signed)
Rx xanax 1mg  was called into [facility] pharmacy due to previous call in there was no documentation on their end to being received.. Patient will use this pharmacy for now and switch it on her next OV

## 2016-08-05 ENCOUNTER — Ambulatory Visit (INDEPENDENT_AMBULATORY_CARE_PROVIDER_SITE_OTHER): Payer: 59 | Admitting: Family Medicine

## 2016-08-05 ENCOUNTER — Encounter: Payer: Self-pay | Admitting: Family Medicine

## 2016-08-05 VITALS — BP 103/74 | HR 73 | Temp 98.5°F | Ht 62.0 in | Wt 159.0 lb

## 2016-08-05 DIAGNOSIS — H6122 Impacted cerumen, left ear: Secondary | ICD-10-CM | POA: Diagnosis not present

## 2016-08-05 NOTE — Progress Notes (Signed)
Pre visit review using our clinic review tool, if applicable. No additional management support is needed unless otherwise documented below in the visit note. 

## 2016-08-05 NOTE — Progress Notes (Signed)
   Subjective:    Patient ID: Caroline Boone, female    DOB: [birth year], 50 y.o.   MRN: [id number]  HPI Here for 2 weeks of problems with the left ear. This started with decreased hearing, then pressure, and then mild vertigo. Now for 2 days it has been painful. She is taking an OTC antihistamine.    Review of Systems  Constitutional: Negative.   HENT: Positive for ear pain and hearing loss. Negative for congestion, ear discharge, postnasal drip, sinus pressure and sore throat.   Eyes: Negative.   Respiratory: Negative.   Neurological: Positive for dizziness. Negative for headaches.       Objective:   Physical Exam  Constitutional: She is oriented to person, place, and time. She appears well-developed and well-nourished.  HENT:  Right Ear: External ear normal.  Left ear canal is full of cerumen   Eyes: Conjunctivae are normal.  Neck: No thyromegaly present.  Pulmonary/Chest: Effort normal and breath sounds normal.  Lymphadenopathy:    She has no cervical adenopathy.  Neurological: She is alert and oriented to person, place, and time.          Assessment & Plan:  Cerumen impaction. This was irrigated clear with water.  Nelwyn SalisburyFRY,Heike Pounds A, MD

## 2016-08-07 ENCOUNTER — Other Ambulatory Visit: Payer: Self-pay | Admitting: Family Medicine

## 2016-08-07 NOTE — Telephone Encounter (Signed)
Call in #[phone] with 5 rf 

## 2016-08-07 NOTE — Telephone Encounter (Signed)
Pt request refill  zolpidem (AMBIEN) 10 MG tablet  Pt is out of [facility] med/ [facility] [address]

## 2016-08-07 NOTE — Telephone Encounter (Signed)
This is a duplicate, see previous note.  

## 2016-09-23 ENCOUNTER — Telehealth: Payer: Self-pay | Admitting: Family Medicine

## 2016-09-23 NOTE — Telephone Encounter (Signed)
Pt request refill  °HYDROcodone-acetaminophen (NORCO/VICODIN) 5-325 MG tablet °

## 2016-09-28 MED ORDER — HYDROCODONE-ACETAMINOPHEN 5-325 MG PO TABS: 1.0000 | ORAL_TABLET | Freq: Four times a day (QID) | ORAL | 0 refills | Status: DC | PRN

## 2016-09-28 NOTE — Telephone Encounter (Signed)
Script is ready for pick up here at front office and I spoke with pt.  

## 2016-09-28 NOTE — Telephone Encounter (Signed)
done

## 2016-12-11 ENCOUNTER — Encounter: Payer: Self-pay | Admitting: Podiatry

## 2016-12-11 ENCOUNTER — Ambulatory Visit (INDEPENDENT_AMBULATORY_CARE_PROVIDER_SITE_OTHER): Payer: 59 | Admitting: Podiatry

## 2016-12-11 DIAGNOSIS — L600 Ingrowing nail: Secondary | ICD-10-CM

## 2016-12-11 DIAGNOSIS — M779 Enthesopathy, unspecified: Secondary | ICD-10-CM

## 2016-12-11 MED ORDER — TRIAMCINOLONE ACETONIDE 10 MG/ML IJ SUSP: 10.0000 mg | Freq: Once | INTRAMUSCULAR | Status: DC

## 2016-12-11 NOTE — Patient Instructions (Signed)

## 2016-12-12 NOTE — Progress Notes (Signed)
Subjective:     Patient ID: Caroline Boone, female   DOB: 05-14-1966, 51 y.o.   MRN: [id number]  HPI patient presents stating my left big toenail has become increasingly thick and loose and I'm worried it's can he gets worn off and it is mildly tender and I'm getting a lot of pain again on my right foot around my joint that has been improved by your previous injections   Review of Systems     Objective:   Physical Exam Neurovascular status intact negative Homans sign was noted with patient found to have a thick damaged loose left hallux nail localized in nature and is also noted to have on the right navicular cuneiform joint inflammation and pain that has been present recently but is controlled with injection treatment    Assessment:     H&P conditions reviewed reviewed and at this point I infiltrated the left hallux remove the left hallux nail allowing new nail to regrow of which I explained that it is possible and will regrow the same way. For the right navicular cuneiform joint I injected 3 mg Kenalog 5 mill grams Xylocaine and applied heat and ice therapy with instructions    Plan:     Damaged nail plate left and for the right chronic tendinitis with probable arthritis of the navicular cuneiform joint

## 2016-12-14 ENCOUNTER — Other Ambulatory Visit: Payer: Self-pay | Admitting: Family Medicine

## 2016-12-14 MED ORDER — HYDROCODONE-ACETAMINOPHEN 5-325 MG PO TABS: 1.0000 | ORAL_TABLET | Freq: Four times a day (QID) | ORAL | 0 refills | Status: DC | PRN

## 2016-12-14 NOTE — Telephone Encounter (Signed)
Script ready for pick up at [facility] front office; spoke to patient to advise; patient reports husband will pick up for her with valid ID

## 2016-12-14 NOTE — Telephone Encounter (Signed)
Pt needs new rx hydrocodone °

## 2016-12-14 NOTE — Telephone Encounter (Signed)
done

## 2017-01-26 ENCOUNTER — Other Ambulatory Visit: Payer: Self-pay | Admitting: Family Medicine

## 2017-01-26 NOTE — Telephone Encounter (Signed)
Call in #[phone] with 5 rf 

## 2017-02-04 ENCOUNTER — Telehealth: Payer: Self-pay | Admitting: Family Medicine

## 2017-02-04 NOTE — Telephone Encounter (Signed)
Error/ltd ° °

## 2017-02-05 ENCOUNTER — Encounter: Payer: Self-pay | Admitting: Family Medicine

## 2017-02-05 ENCOUNTER — Encounter: Payer: Self-pay | Admitting: Emergency Medicine

## 2017-02-05 ENCOUNTER — Ambulatory Visit (INDEPENDENT_AMBULATORY_CARE_PROVIDER_SITE_OTHER): Payer: 59 | Admitting: Family Medicine

## 2017-02-05 VITALS — BP 100/78 | HR 78 | Temp 98.0°F | Wt 161.4 lb

## 2017-02-05 DIAGNOSIS — N390 Urinary tract infection, site not specified: Secondary | ICD-10-CM | POA: Diagnosis not present

## 2017-02-05 DIAGNOSIS — R350 Frequency of micturition: Secondary | ICD-10-CM | POA: Diagnosis not present

## 2017-02-05 LAB — POC URINALSYSI DIPSTICK (AUTOMATED)
BILIRUBIN UA: NEGATIVE
Blood, UA: NEGATIVE
Glucose, UA: NEGATIVE
KETONES UA: NEGATIVE
Leukocytes, UA: NEGATIVE
NITRITE UA: NEGATIVE
PROTEIN UA: NEGATIVE
Spec Grav, UA: 1.015 (ref 1.010–1.025)
Urobilinogen, UA: 0.2 E.U./dL
pH, UA: 6 (ref 5.0–8.0)

## 2017-02-05 MED ORDER — CIPROFLOXACIN HCL 500 MG PO TABS: 500.0000 mg | ORAL_TABLET | Freq: Two times a day (BID) | ORAL | 0 refills | Status: DC

## 2017-02-05 NOTE — Patient Instructions (Signed)
WE NOW OFFER   [facility] FAST TRACK!!!  SAME DAY Appointments for ACUTE CARE  Such as: Sprains, Injuries, cuts, abrasions, rashes, muscle pain, joint pain, back pain Colds, flu, sore throats, headache, allergies, cough, fever  Ear pain, sinus and eye infections Abdominal pain, nausea, vomiting, diarrhea, upset stomach Animal/insect bites  3 Easy Ways to Schedule: Walk-In Scheduling Call in scheduling Mychart Sign-up: [email or web address]         

## 2017-02-05 NOTE — Progress Notes (Signed)
Pre visit review using our clinic review tool, if applicable. No additional management support is needed unless otherwise documented below in the visit note. 

## 2017-02-05 NOTE — Progress Notes (Signed)
   Subjective:    Patient ID: Caroline Boone, female    DOB: Dec 18, 1965, 51 y.o.   MRN: [id number]  HPI Here for 2 weeks of lower back pressure and lower abdominal pressure, and increased urgency to urinate. No fever. Her BMs are unchanged.    Review of Systems  Constitutional: Negative.   Respiratory: Negative.   Cardiovascular: Negative.   Gastrointestinal: Negative.   Genitourinary: Positive for frequency, pelvic pain and urgency. Negative for dysuria, flank pain and hematuria.       Objective:   Physical Exam  Constitutional: She appears well-developed and well-nourished. No distress.  Cardiovascular: Normal rate, regular rhythm, normal heart sounds and intact distal pulses.   Pulmonary/Chest: Effort normal and breath sounds normal.  Abdominal: Soft. Bowel sounds are normal. She exhibits no distension and no mass. There is no tenderness. There is no rebound and no guarding.          Assessment & Plan:  Probable UTI. Treat with Cipro. Drink plenty of water. Culture the sample.  Gershon Crane, MD

## 2017-02-07 LAB — URINE CULTURE

## 2017-02-16 ENCOUNTER — Other Ambulatory Visit: Payer: Self-pay | Admitting: Family Medicine

## 2017-02-16 NOTE — Telephone Encounter (Signed)
Cal in #270 with one rf

## 2017-02-23 ENCOUNTER — Ambulatory Visit (INDEPENDENT_AMBULATORY_CARE_PROVIDER_SITE_OTHER): Payer: 59 | Admitting: Family Medicine

## 2017-02-23 ENCOUNTER — Encounter: Payer: Self-pay | Admitting: Family Medicine

## 2017-02-23 VITALS — BP 102/80 | HR 79 | Temp 98.5°F | Ht 62.0 in | Wt 164.0 lb

## 2017-02-23 DIAGNOSIS — K219 Gastro-esophageal reflux disease without esophagitis: Secondary | ICD-10-CM

## 2017-02-23 DIAGNOSIS — F909 Attention-deficit hyperactivity disorder, unspecified type: Secondary | ICD-10-CM | POA: Insufficient documentation

## 2017-02-23 DIAGNOSIS — F900 Attention-deficit hyperactivity disorder, predominantly inattentive type: Secondary | ICD-10-CM | POA: Diagnosis not present

## 2017-02-23 MED ORDER — OMEPRAZOLE 40 MG PO CPDR: 40.0000 mg | DELAYED_RELEASE_CAPSULE | Freq: Every day | ORAL | 5 refills | Status: DC

## 2017-02-23 MED ORDER — AMPHETAMINE-DEXTROAMPHET ER 10 MG PO CP24: 10.0000 mg | ORAL_CAPSULE | Freq: Every day | ORAL | 0 refills | Status: DC

## 2017-02-23 NOTE — Patient Instructions (Signed)
WE NOW OFFER   [facility] FAST TRACK!!!  SAME DAY Appointments for ACUTE CARE  Such as: Sprains, Injuries, cuts, abrasions, rashes, muscle pain, joint pain, back pain Colds, flu, sore throats, headache, allergies, cough, fever  Ear pain, sinus and eye infections Abdominal pain, nausea, vomiting, diarrhea, upset stomach Animal/insect bites  3 Easy Ways to Schedule: Walk-In Scheduling Call in scheduling Mychart Sign-up: [email or web address]         

## 2017-02-23 NOTE — Progress Notes (Signed)
   Subjective:    Patient ID: Caroline Boone, female    DOB: 25-Feb-1966, 51 y.o.   MRN: [id number]  HPI Here for 2 issues. First she has frequent heartburn which bothers her during the day and even at night. She describes classic burning sensations in the chest and a sour taste in the back of the mouth. She takes Scientist, research (medical)TUMS and this works at times. No nausea or trouble swallowing. She has tried OTC Omeprazole 20 mg without success. Second she asks for help with a short attention span. Over the past year the demands of her job have increased and she is expected to turn over her work quickly. She often has to bring home for the weekends as well. She often feels over whelmed, she has trouble focusing on her tasks, and she forgets to take care of details. She notes her daughter is treated for ADHD and has excellent success with her medication Willaim Sheng([place] does not know what this is). Her anxiety is well controlled with occasional use of Xanax.    Review of Systems  Constitutional: Negative.   Respiratory: Negative.   Cardiovascular: Positive for chest pain. Negative for palpitations and leg swelling.  Gastrointestinal: Negative.   Neurological: Negative.   Psychiatric/Behavioral: Positive for decreased concentration. Negative for agitation, behavioral problems, confusion, dysphoric mood, hallucinations, self-injury, sleep disturbance and suicidal ideas. The patient is nervous/anxious. The patient is not hyperactive.        Objective:   Physical Exam  Constitutional: She is oriented to person, place, and time. She appears well-developed and well-nourished. No distress.  Neck: No thyromegaly present.  Cardiovascular: Normal rate, regular rhythm, normal heart sounds and intact distal pulses.   Pulmonary/Chest: Effort normal and breath sounds normal. No respiratory distress. She has no wheezes. She has no rales.  Abdominal: Soft. Bowel sounds are normal. She exhibits no distension and no mass. There is no  tenderness. There is no rebound and no guarding.  Lymphadenopathy:    She has no cervical adenopathy.  Neurological: She is alert and oriented to person, place, and time.  Psychiatric: She has a normal mood and affect. Her behavior is normal. Thought content normal.          Assessment & Plan:  For the GERD she will try 40 mg Omeprazole and I advised her to take this on a daily basis. She does seem to have some ADHD so she will try Adderall XR 10 mg in the morning on a prn basis. Recheck in one month.  Gershon CraneStephen Anquanette Bahner, MD

## 2017-02-24 ENCOUNTER — Ambulatory Visit: Payer: 59 | Admitting: Family Medicine

## 2017-03-02 ENCOUNTER — Telehealth: Payer: Self-pay | Admitting: Family Medicine

## 2017-03-02 MED ORDER — HYDROCODONE-ACETAMINOPHEN 5-325 MG PO TABS: 1.0000 | ORAL_TABLET | Freq: Four times a day (QID) | ORAL | 0 refills | Status: DC | PRN

## 2017-03-02 NOTE — Telephone Encounter (Signed)
Pt needs refill on hydrocodone.  °

## 2017-03-02 NOTE — Telephone Encounter (Signed)
done

## 2017-03-02 NOTE — Telephone Encounter (Signed)
Script is ready for pick up here at front office and I spoke with pt.  

## 2017-03-08 ENCOUNTER — Ambulatory Visit (INDEPENDENT_AMBULATORY_CARE_PROVIDER_SITE_OTHER): Payer: 59 | Admitting: Podiatry

## 2017-03-08 ENCOUNTER — Encounter: Payer: Self-pay | Admitting: Podiatry

## 2017-03-08 DIAGNOSIS — M779 Enthesopathy, unspecified: Secondary | ICD-10-CM | POA: Diagnosis not present

## 2017-03-08 MED ORDER — TRIAMCINOLONE ACETONIDE 10 MG/ML IJ SUSP
10.0000 mg | Freq: Once | INTRAMUSCULAR | Status: AC
  Administered 2017-03-08: 10 mg

## 2017-03-09 NOTE — Progress Notes (Signed)
Subjective:    Patient ID: Caroline Boone, female   DOB: 51 y.o.   MRN: [id number]   HPI patient states that she's been trying to exercise and is developed more discomfort in her right foot    ROS      Objective:  Physical Exam Neurovascular status intact with inflammation around the navicular insertion of the posterior tibial tendon right with fluid buildup but no indication of muscle dysfunction    Assessment:   Tendinitis right posterior tib at its insertion with structure the foot being part of the problem      Plan:     H&P condition reviewed and recommended careful sheath injection along with orthotics which were scanned for today. Patient will be seen back when orthotics are returned

## 2017-04-20 ENCOUNTER — Ambulatory Visit (INDEPENDENT_AMBULATORY_CARE_PROVIDER_SITE_OTHER): Payer: 59 | Admitting: Orthopaedic Surgery

## 2017-04-27 ENCOUNTER — Telehealth: Payer: Self-pay | Admitting: Family Medicine

## 2017-04-27 MED ORDER — AMPHETAMINE-DEXTROAMPHET ER 10 MG PO CP24: 10.0000 mg | ORAL_CAPSULE | Freq: Every day | ORAL | 0 refills | Status: DC

## 2017-04-27 NOTE — Telephone Encounter (Signed)
Pt request refill  amphetamine-dextroamphetamine (ADDERALL XR) 10 MG 24 hr capsule

## 2017-04-27 NOTE — Telephone Encounter (Signed)
Done for 3 months

## 2017-04-28 NOTE — Telephone Encounter (Signed)
Script is ready for pick up here at front office and I spoke with pt.  

## 2017-05-03 ENCOUNTER — Encounter (INDEPENDENT_AMBULATORY_CARE_PROVIDER_SITE_OTHER): Payer: Self-pay | Admitting: Orthopaedic Surgery

## 2017-05-03 ENCOUNTER — Ambulatory Visit (INDEPENDENT_AMBULATORY_CARE_PROVIDER_SITE_OTHER): Payer: 59 | Admitting: Orthopaedic Surgery

## 2017-05-03 VITALS — BP 130/77 | HR 74 | Resp 14 | Ht 64.0 in | Wt 165.0 lb

## 2017-05-03 DIAGNOSIS — M79671 Pain in right foot: Secondary | ICD-10-CM | POA: Diagnosis not present

## 2017-05-03 MED ORDER — DICLOFENAC SODIUM 1 % TD GEL: 2.0000 g | Freq: Four times a day (QID) | TRANSDERMAL | 1 refills | Status: DC

## 2017-05-03 NOTE — Progress Notes (Signed)
Office Visit Note   Patient: Caroline Boone           Date of Birth: December 21, 1965           MRN: [id number] Visit Date: 05/03/2017              Requested by: Nelwyn SalisburyFry, Stephen A, MD [address] [place], [place] [address] PCP: Nelwyn SalisburyFry, Stephen A, MD   Assessment & Plan: Visit Diagnoses:  1. Pain in right foot   2. 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 I'd like to obtain an MRI scan of her right foot to be sure there are no other areas of pathology. I have discussed different treatment options including supportive shoes with an arch support, surgery for fusion and even local treatment I.e. Voltaren gel. We'll know more after MRI scan  Follow-Up Instructions: Return after MRI right knee.   Orders:  Orders Placed This Encounter  Procedures  . MR Foot Right w/o contrast   Meds ordered this encounter  Medications  . diclofenac sodium (VOLTAREN) 1 % GEL    Sig: Apply 2 g topically 4 (four) times daily.    Dispense:  3 Tube    Refill:  1      Procedures: No procedures performed   Clinical Data: No additional findings.   Subjective: Chief Complaint  Patient presents with  . Right Foot - Pain, Edema, Numbness  Caroline Boone experienced insidious onset of right foot pain approximately year and a half ago without injury or trauma. She has seen one of the local podiatrist who was injected cortisone with temporary relief of her pain. Pain is localized near the submetatarsal junction medially. There is no particular pattern to her pain. Oftentimes its more of an issue when she's been on her feet for long periods of time. She denies numbness or tingling, he'll pain or posterior ankle discomfort. She has tried Tylenol. She like to be more active as the pain does compromise her activities. I did review her films from November on the PACS system. There is evidence  of degenerative arthrosis at of the right foot at the navicular first cuneiform joint. The joint space is narrowed and irregular. I do not see any other areas of abnormality. There are small posterior and plantar heel spurs that are not symptomatic.  HPI  Review of Systems   Objective: Vital Signs: BP 130/77   Pulse 74   Resp 14   Ht 5\' 4"  (1.626 m)   Wt 165 lb (74.8 kg)   LMP 06/14/2011   BMI 28.32 kg/m   Physical Exam  Ortho Exam right foot without heel pain. No plantar discomfort. Good arch. Neurovascular exam intact. No bunion. No pain with range of motion of any of the dead metatarsal tarsal joints. Skin intact. Good pulses. Localized swelling and tenderness proximal to the great toe tarsometatarsal joint. No ankle discomfort. No mass formation.  Specialty Comments:  No specialty comments available.  Imaging: No results found.   PMFS History: Patient Active Problem List   Diagnosis Date Noted  . ADHD 02/23/2017  . Low back pain 06/18/2015  . Depression 06/18/2015  . Fibromuscular dysplasia (HCC) 11/04/2011  . Shortness of breath 03/20/2011  . Fatigue 03/20/2011  . Bruit 03/20/2011  . Hyperlipidemia 03/20/2011  . CHEST PAIN 05/20/2010  . MORTON'S NEUROMA 12/27/2009  . ARTHRITIS 12/27/2009  . Palpitations 03/27/2009  .  Anxiety state 04/17/2008  . Essential hypertension 04/17/2008  . GERD 04/17/2008  . INSOMNIA 04/17/2008  . HEADACHE 04/17/2008   Past Medical History:  Diagnosis Date  . Anxiety   . Carotid artery injury   . Carotid stenosis    Dopplers 6/12:0-39% bilateral; distal ICAs with marked tortuosity; CT angiogram recommended;  followed at [facility]; surgery to correct LICA stenosis and pseudoaneurysm too risky - > medical Rx;  dopplers 8/13: B/L 0-39% (stable)  . Cerebral aneurysm   . Chest pain    GXT echo 8/11: normal LVF, no ischemia;  h/o neg. myoview in 2005  . Fibromuscular dysplasia (HCC)    affects left vertebral,  artery, left ICA, left renal  artery;  head and neck CTA 10/24/11: stable pattern of fibromuscular dysplasia of ICA and VA with distal changes suggesting prior pseudoaneurysm and dissection; L dist ICA 60%; normal MRI 10/29/11;  followed at [facility] with serial CT scans  . GERD (gastroesophageal reflux disease)   . Headache(784.0)   . History of cardiac cath 2007   cath 11/25/05 by Dr. Jenne Campus with normal cors and EF 50%  . Hypertension    a. echo 7/11: mod LVH, EF 55-65%  . Insomnia   . Palpitations    hx  . 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 Prescott Parma  at [facility] History  Problem Relation Age of Onset  . Coronary artery disease Mother   . Kidney disease Mother   . Heart disease Mother   . Heart attack Father 29       CABG  . Kidney failure Father   . Hypertension Father   . Heart disease Father   . Arthritis Unknown        family hx  . Breast cancer Unknown        relative ,50  . Diabetes Unknown        family hx  . Hypertension Unknown        family hx  . Kidney disease Unknown        family hx  . Lung cancer Unknown        family hx    Past Surgical History:  Procedure Laterality Date  . BREAST ENHANCEMENT SURGERY  1991   Bilateral breast    Social History   Occupational History  . Not on file.   Social History Main Topics  . Smoking status: Never Smoker  . Smokeless tobacco: Never Used  . Alcohol use No  . Drug use: No  . Sexual activity: Yes    Birth control/ protection: None     Valeria Batman, MD   Note - This record has been created using [employer].  Chart creation errors have been sought, but may not always  have been located. Such creation errors do not reflect on  the standard of medical care.

## 2017-05-06 ENCOUNTER — Encounter: Payer: Self-pay | Admitting: Family Medicine

## 2017-05-06 ENCOUNTER — Ambulatory Visit (INDEPENDENT_AMBULATORY_CARE_PROVIDER_SITE_OTHER): Payer: 59 | Admitting: Family Medicine

## 2017-05-06 VITALS — BP 124/82 | Temp 97.9°F | Ht 64.0 in | Wt 162.0 lb

## 2017-05-06 DIAGNOSIS — Z0000 Encounter for general adult medical examination without abnormal findings: Secondary | ICD-10-CM

## 2017-05-06 LAB — LIPID PANEL
CHOLESTEROL: 205 mg/dL — AB (ref 0–200)
HDL: 80.9 mg/dL (ref >39.00)
LDL CALC: 114 mg/dL — AB (ref 0–99)
NONHDL: 124.28
Total CHOL/HDL Ratio: 3
Triglycerides: 49 mg/dL (ref 0.0–149.0)
VLDL: 9.8 mg/dL (ref 0.0–40.0)

## 2017-05-06 LAB — BASIC METABOLIC PANEL
BUN: 12 mg/dL (ref 6–23)
CALCIUM: 9.9 mg/dL (ref 8.4–10.5)
CO2: 30 meq/L (ref 19–32)
CREATININE: 0.55 mg/dL (ref 0.40–1.20)
Chloride: 100 mEq/L (ref 96–112)
GFR: 122.85 mL/min (ref >60.00)
GLUCOSE: 89 mg/dL (ref 70–99)
Potassium: 3.9 mEq/L (ref 3.5–5.1)
Sodium: 137 mEq/L (ref 135–145)

## 2017-05-06 LAB — CBC WITH DIFFERENTIAL/PLATELET
Basophils Absolute: 0 10*3/uL (ref 0.0–0.1)
Basophils Relative: 0.9 % (ref 0.0–3.0)
EOS PCT: 1.9 % (ref 0.0–5.0)
Eosinophils Absolute: 0.1 10*3/uL (ref 0.0–0.7)
HEMATOCRIT: 41.7 % (ref 36.0–46.0)
HEMOGLOBIN: 14.1 g/dL (ref 12.0–15.0)
LYMPHS PCT: 35.3 % (ref 12.0–46.0)
Lymphs Abs: 1.8 10*3/uL (ref 0.7–4.0)
MCHC: 33.9 g/dL (ref 30.0–36.0)
MCV: 94.3 fl (ref 78.0–100.0)
Monocytes Absolute: 0.5 10*3/uL (ref 0.1–1.0)
Monocytes Relative: 9.8 % (ref 3.0–12.0)
Neutro Abs: 2.7 10*3/uL (ref 1.4–7.7)
Neutrophils Relative %: 52.1 % (ref 43.0–77.0)
Platelets: 306 10*3/uL (ref 150.0–400.0)
RBC: 4.42 Mil/uL (ref 3.87–5.11)
RDW: 13.2 % (ref 11.5–15.5)
WBC: 5.2 10*3/uL (ref 4.0–10.5)

## 2017-05-06 LAB — POC URINALSYSI DIPSTICK (AUTOMATED)
Bilirubin, UA: NEGATIVE
Glucose, UA: NEGATIVE
Ketones, UA: NEGATIVE
LEUKOCYTES UA: NEGATIVE
NITRITE UA: NEGATIVE
PROTEIN UA: NEGATIVE
RBC UA: NEGATIVE
Spec Grav, UA: 1.01 (ref 1.010–1.025)
UROBILINOGEN UA: 0.2 U/dL
pH, UA: 6 (ref 5.0–8.0)

## 2017-05-06 LAB — HEPATIC FUNCTION PANEL
ALBUMIN: 4.4 g/dL (ref 3.5–5.2)
ALT: 12 U/L (ref 0–35)
AST: 15 U/L (ref 0–37)
Alkaline Phosphatase: 74 U/L (ref 39–117)
Bilirubin, Direct: 0.1 mg/dL (ref 0.0–0.3)
TOTAL PROTEIN: 6.9 g/dL (ref 6.0–8.3)
Total Bilirubin: 0.5 mg/dL (ref 0.2–1.2)

## 2017-05-06 LAB — TSH: TSH: 0.6 u[IU]/mL (ref 0.35–4.50)

## 2017-05-06 MED ORDER — PHENTERMINE HCL 30 MG PO CAPS: 30.0000 mg | ORAL_CAPSULE | ORAL | 0 refills | Status: DC

## 2017-05-06 NOTE — Patient Instructions (Signed)
WE NOW OFFER   [facility] FAST TRACK!!!  SAME DAY Appointments for ACUTE CARE  Such as: Sprains, Injuries, cuts, abrasions, rashes, muscle pain, joint pain, back pain Colds, flu, sore throats, headache, allergies, cough, fever  Ear pain, sinus and eye infections Abdominal pain, nausea, vomiting, diarrhea, upset stomach Animal/insect bites  3 Easy Ways to Schedule: Walk-In Scheduling Call in scheduling Mychart Sign-up: [email or web address]         

## 2017-05-06 NOTE — Progress Notes (Signed)
   Subjective:    Patient ID: Caroline Boone, female    DOB: 06-26-1966, 51 y.o.   MRN: [id number]  HPI Here for a wellness exam. She has felt well in general but she has chronic pain in the right foot. She had seen [facility] for this and now she sees Dr. Cleophas DunkerWhitfield. She has arthritis and a cyst on the foot. This has made exercise difficult and she has gained some weight. She would like to try Phentermine for awhile. She last saw [facility] for the fibromuscular dysplasia in the left carotid artery in February 2016.    Review of Systems  Constitutional: Negative.   HENT: Negative.   Eyes: Negative.   Respiratory: Negative.   Cardiovascular: Negative.   Gastrointestinal: Negative.   Genitourinary: Negative for decreased urine volume, difficulty urinating, dyspareunia, dysuria, enuresis, flank pain, frequency, hematuria, pelvic pain and urgency.  Musculoskeletal: Negative.   Skin: Negative.   Neurological: Negative.   Psychiatric/Behavioral: Negative.        Objective:   Physical Exam  Constitutional: She is oriented to person, place, and time. She appears well-developed and well-nourished. No distress.  HENT:  Head: Normocephalic and atraumatic.  Right Ear: External ear normal.  Left Ear: External ear normal.  Nose: Nose normal.  Mouth/Throat: Oropharynx is clear and moist. No oropharyngeal exudate.  Eyes: Pupils are equal, round, and reactive to light. Conjunctivae and EOM are normal. No scleral icterus.  Neck: Normal range of motion. Neck supple. No JVD present. No thyromegaly present.  There is a soft bruit in the left carotid artery   Cardiovascular: Normal rate, regular rhythm, normal heart sounds and intact distal pulses.  Exam reveals no gallop and no friction rub.   No murmur heard. Pulmonary/Chest: Effort normal and breath sounds normal. No respiratory distress. She has no wheezes. She has no rales. She exhibits no tenderness.  Abdominal: Soft.  Bowel sounds are normal. She exhibits no distension and no mass. There is no tenderness. There is no rebound and no guarding.  Musculoskeletal: Normal range of motion. She exhibits no edema or tenderness.  Lymphadenopathy:    She has no cervical adenopathy.  Neurological: She is alert and oriented to person, place, and time. She has normal reflexes. No cranial nerve deficit. She exhibits normal muscle tone. Coordination normal.  Skin: Skin is warm and dry. No rash noted. No erythema.  Psychiatric: She has a normal mood and affect. Her behavior is normal. Judgment and thought content normal.          Assessment & Plan:  Well exam. We discussed diet and exercise. Get fasting labs. I advised her to contact Dr. Kelby AlineJanjua to set up a follow up exam on her carotid artery soon. Try Phentermine for 90 days.  Gershon CraneStephen Fry, MD

## 2017-05-11 ENCOUNTER — Telehealth: Payer: Self-pay | Admitting: Family Medicine

## 2017-05-11 MED ORDER — PROMETHAZINE HCL 25 MG PO TABS: 25.0000 mg | ORAL_TABLET | ORAL | 2 refills | Status: DC | PRN

## 2017-05-11 NOTE — Telephone Encounter (Signed)
Pt requesting script for Phenergan and send to [facility], said she discussed with you during last office visit.

## 2017-05-11 NOTE — Telephone Encounter (Signed)
I sent script e-scribe to [facility] and spoke with pt.  

## 2017-05-11 NOTE — Telephone Encounter (Signed)
Cal in Phenergan 25 mg tabs to take every 4 hours prn nausea, #60 with 2 rf

## 2017-05-14 ENCOUNTER — Telehealth: Payer: Self-pay | Admitting: Family Medicine

## 2017-05-14 NOTE — Telephone Encounter (Signed)
Pt is calling stating that [facility] is saying that she does not have any refills on her Ambien that she knows she had at [facility] on [address]  Pt need new Rx called in to Pharm:  [facility] [address]  Pt state that she is going out of town and need to see if it can be called in today.

## 2017-05-14 NOTE — Telephone Encounter (Signed)
Per Dr. Clent RidgesFry okay to transfer. [facility] and spoke with pt.

## 2017-05-22 ENCOUNTER — Other Ambulatory Visit: Payer: 59

## 2017-05-23 ENCOUNTER — Other Ambulatory Visit: Payer: 59

## 2017-05-25 ENCOUNTER — Telehealth: Payer: Self-pay | Admitting: Family Medicine

## 2017-05-25 MED ORDER — HYDROCODONE-ACETAMINOPHEN 5-325 MG PO TABS: 1.0000 | ORAL_TABLET | Freq: Four times a day (QID) | ORAL | 0 refills | Status: DC | PRN

## 2017-05-25 NOTE — Telephone Encounter (Signed)
Pt need new Rx for Vicodin   Pt is aware of 3 business days for refills and someone will call when ready for pick up.

## 2017-05-25 NOTE — Telephone Encounter (Signed)
done

## 2017-05-26 NOTE — Telephone Encounter (Signed)
Script is ready for pick up here at front office and I spoke with pt.  

## 2017-05-27 ENCOUNTER — Ambulatory Visit (INDEPENDENT_AMBULATORY_CARE_PROVIDER_SITE_OTHER): Payer: 59 | Admitting: Orthopaedic Surgery

## 2017-05-29 ENCOUNTER — Ambulatory Visit
Admission: RE | Admit: 2017-05-29 | Discharge: 2017-05-29 | Disposition: A | Discharge status: Home / Self Care | Payer: 59 | Source: Ambulatory Visit | Attending: Orthopaedic Surgery | Admitting: Orthopaedic Surgery

## 2017-05-29 DIAGNOSIS — M79671 Pain in right foot: Secondary | ICD-10-CM

## 2017-05-29 DIAGNOSIS — M19071 Primary osteoarthritis, right ankle and foot: Secondary | ICD-10-CM | POA: Diagnosis not present

## 2017-06-01 ENCOUNTER — Ambulatory Visit (INDEPENDENT_AMBULATORY_CARE_PROVIDER_SITE_OTHER): Payer: 59 | Admitting: Orthopaedic Surgery

## 2017-06-01 ENCOUNTER — Encounter (INDEPENDENT_AMBULATORY_CARE_PROVIDER_SITE_OTHER): Payer: Self-pay | Admitting: Orthopaedic Surgery

## 2017-06-01 VITALS — Resp 14 | Ht 64.0 in | Wt 174.0 lb

## 2017-06-01 DIAGNOSIS — M2550 Pain in unspecified joint: Secondary | ICD-10-CM

## 2017-06-01 DIAGNOSIS — M79671 Pain in right foot: Secondary | ICD-10-CM | POA: Diagnosis not present

## 2017-06-01 LAB — CBC WITH DIFFERENTIAL/PLATELET
BASOS PCT: 0 %
Basophils Absolute: 0 cells/uL (ref 0–200)
EOS ABS: 140 {cells}/uL (ref 15–500)
EOS PCT: 2 %
HCT: 39.4 % (ref 35.0–45.0)
Hemoglobin: 13.5 g/dL (ref 11.7–15.5)
LYMPHS ABS: 3010 {cells}/uL (ref 850–3900)
Lymphocytes Relative: 43 %
MCH: 31.8 pg (ref 27.0–33.0)
MCHC: 34.3 g/dL (ref 32.0–36.0)
MCV: 92.9 fL (ref 80.0–100.0)
MONOS PCT: 10 %
MPV: 9.9 fL (ref 7.5–12.5)
Monocytes Absolute: 700 cells/uL (ref 200–950)
NEUTROS ABS: 3150 {cells}/uL (ref 1500–7800)
Neutrophils Relative %: 45 %
PLATELETS: 326 10*3/uL (ref 140–400)
RBC: 4.24 MIL/uL (ref 3.80–5.10)
RDW: 13.1 % (ref 11.0–15.0)
WBC: 7 10*3/uL (ref 3.8–10.8)

## 2017-06-01 NOTE — Progress Notes (Signed)
Office Visit Note   Patient: Caroline Boone           Date of Birth: 15-Sep-1966           MRN: [id number] Visit Date: 06/01/2017              Requested by: Nelwyn SalisburyFry, Stephen A, MD [address] [place], [place] [address] PCP: Nelwyn SalisburyFry, Stephen A, MD   Assessment & Plan: Visit Diagnoses:  1. Right foot pain   2. Polyarthralgia   Mrs. Kellett had an MRI scan of her right foot on 05/29/2017. This demonstrated joint space narrowing with reactive marrow edema and some degenerative cystic changes about the navicular cuneiform joint consistent with advanced osteoarthritis. She has lesser degree of reactive marrow edema and joint space narrowing and subchondral cyst formation across the third through fifth tarsometatarsal articulations. There was also some 3 involving the medial talar dome with an area of edema 3 x 7 x 4 mm. No loose body fragments were identified  Plan: Long discussion regarding the MRI scan findings. There is a positive family history of rheumatoid arthritis and we will obtain some lab work. It also discussed Voltaren gel cortisone injections and anti-inflammatory medicines shoe modifications and comfortable shoes. I've also offered her referral to a foot and ankle specialist. For the moment she wants to check on the lab which we will have drawn today  Follow-Up Instructions: Return in about 1 month (around 07/02/2017).   Orders:  Orders Placed This Encounter  Procedures  . CBC with Differential/Platelet  . COMPLETE METABOLIC PANEL WITH GFR  . Rheumatoid factor  . ANA  . Sedimentation rate   No orders of the defined types were placed in this encounter.     Procedures: No procedures performed   Clinical Data: No additional findings.   Subjective: Chief Complaint  Patient presents with  . Left Foot - Results    Pt here for MRI results. Now experiencing some pain radiating into her leg  Mrs. Planck has had some discomfort on a chronic basis in her neck and lumbar  spine. She also was had some stiffness and some of the joints of her finger. There is a positive family history of rheumatoid arthritis. MRI scan findings as above consistent with osteoarthritis.  HPI  Review of Systems  Constitutional: Negative for chills, fatigue and fever.  Eyes: Negative for itching.  Respiratory: Negative for chest tightness and shortness of breath.   Cardiovascular: Negative for chest pain, palpitations and leg swelling.  Gastrointestinal: Negative for blood in stool, constipation and diarrhea.  Musculoskeletal: Negative for back pain, joint swelling, neck pain and neck stiffness.  Neurological: Negative for dizziness, weakness, numbness and headaches.  Hematological: Does not bruise/bleed easily.  Psychiatric/Behavioral: Negative for sleep disturbance. The patient is not nervous/anxious.      Objective: Vital Signs: Resp 14   Ht 5\' 4"  (1.626 m)   Wt 174 lb (78.9 kg)   LMP 06/14/2011   BMI 29.87 kg/m   Physical Exam  Ortho Exam right foot with some prominence about the mid foot at the tarsometatarsal junction. Some tenderness in the area of the navicula first cuneiform joint MRI scan reveals severe osteoarthritis. Skin intact. Good pulses. Normal sensibility. No ankle pain. No pain over either malleolus. Motor exam intact. No calf pain. No pain range of motion of knee or hip. Straight leg raise negative  Specialty Comments:  No specialty comments available.  Imaging: No results found.   PMFS History: Patient  Active Problem List   Diagnosis Date Noted  . ADHD 02/23/2017  . Low back pain 06/18/2015  . Depression 06/18/2015  . Fibromuscular dysplasia (HCC) 11/04/2011  . Shortness of breath 03/20/2011  . Fatigue 03/20/2011  . Bruit 03/20/2011  . Hyperlipidemia 03/20/2011  . CHEST PAIN 05/20/2010  . MORTON'S NEUROMA 12/27/2009  . ARTHRITIS 12/27/2009  . Palpitations 03/27/2009  . Anxiety state 04/17/2008  . Essential hypertension 04/17/2008  .  GERD 04/17/2008  . INSOMNIA 04/17/2008  . HEADACHE 04/17/2008   Past Medical History:  Diagnosis Date  . Anxiety   . Carotid artery injury   . Carotid stenosis    Dopplers 6/12:0-39% bilateral; distal ICAs with marked tortuosity; CT angiogram recommended;  followed at [facility]; surgery to correct LICA stenosis and pseudoaneurysm too risky - > medical Rx;  dopplers 8/13: B/L 0-39% (stable)  . Cerebral aneurysm   . Chest pain    GXT echo 8/11: normal LVF, no ischemia;  h/o neg. myoview in 2005  . Fibromuscular dysplasia (HCC)    affects left vertebral,  artery, left ICA, left renal artery;  head and neck CTA 10/24/11: stable pattern of fibromuscular dysplasia of ICA and VA with distal changes suggesting prior pseudoaneurysm and dissection; L dist ICA 60%; normal MRI 10/29/11;  followed at [facility] with serial CT scans  . GERD (gastroesophageal reflux disease)   . Headache(784.0)   . History of cardiac cath 2007   cath 11/25/05 by Dr. Jenne Campus with normal cors and EF 50%  . Hypertension    a. echo 7/11: mod LVH, EF 55-65%  . Insomnia   . Palpitations    hx  . 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 Prescott Parma  at [facility] History  Problem Relation Age of Onset  . Coronary artery disease Mother   . Kidney disease Mother   . Heart disease Mother   . Heart attack Father 32       CABG  . Kidney failure Father   . Hypertension Father   . Heart disease Father   . Arthritis Unknown        family hx  . Breast cancer Unknown        relative ,50  . Diabetes Unknown        family hx  . Hypertension Unknown        family hx  . Kidney disease Unknown        family hx  . Lung cancer Unknown        family hx    Past Surgical History:  Procedure Laterality Date  . BREAST ENHANCEMENT SURGERY  1991   Bilateral breast   . COLONOSCOPY  04/27/2014   per Dr. Loreta Ave, tubular adenomas, repeat in 10 yrs    Social History   Occupational  History  . Not on file.   Social History Main Topics  . Smoking status: Never Smoker  . Smokeless tobacco: Never Used  . Alcohol use No  . Drug use: No  . Sexual activity: Yes    Birth control/ protection: None

## 2017-06-02 LAB — COMPLETE METABOLIC PANEL WITH GFR
ALT: 11 U/L (ref 6–29)
AST: 18 U/L (ref 10–35)
Albumin: 4.4 g/dL (ref 3.6–5.1)
Alkaline Phosphatase: 74 U/L (ref 33–130)
BILIRUBIN TOTAL: 0.4 mg/dL (ref 0.2–1.2)
BUN: 10 mg/dL (ref 7–25)
CO2: 28 mmol/L (ref 20–32)
CREATININE: 0.52 mg/dL (ref 0.50–1.05)
Calcium: 9.4 mg/dL (ref 8.6–10.4)
Chloride: 95 mmol/L — ABNORMAL LOW (ref 98–110)
GFR, Est African American: >89 mL/min (ref >=60)
GFR, Est Non African American: >89 mL/min (ref >=60)
GLUCOSE: 89 mg/dL (ref 65–99)
Potassium: 4.7 mmol/L (ref 3.5–5.3)
SODIUM: 134 mmol/L — AB (ref 135–146)
TOTAL PROTEIN: 6.9 g/dL (ref 6.1–8.1)

## 2017-06-02 LAB — SEDIMENTATION RATE: SED RATE: 1 mm/h (ref 0–30)

## 2017-06-02 LAB — RHEUMATOID FACTOR: Rhuematoid fact SerPl-aCnc: <14 IU/mL (ref <14)

## 2017-06-02 LAB — ANA: Anti Nuclear Antibody(ANA): NEGATIVE

## 2017-06-04 ENCOUNTER — Other Ambulatory Visit: Payer: Self-pay | Admitting: Family Medicine

## 2017-06-04 NOTE — Telephone Encounter (Signed)
She already has refills until 07-28-17

## 2017-06-04 NOTE — Telephone Encounter (Signed)
Pt need new Rx for Adderall   Pt is aware of 3 business days for refills and someone will call when ready for pick up. °

## 2017-06-07 NOTE — Telephone Encounter (Signed)
Pt states she found her 2 scripts.

## 2017-06-07 NOTE — Telephone Encounter (Signed)
Did speak with pt and she verified that she did in fact find her rx and she needed nothing further from [place]. Will close this message.

## 2017-07-07 ENCOUNTER — Other Ambulatory Visit: Payer: Self-pay | Admitting: Family Medicine

## 2017-07-08 NOTE — Telephone Encounter (Signed)
Call in #[phone] with 5 rf 

## 2017-08-05 ENCOUNTER — Other Ambulatory Visit: Payer: Self-pay | Admitting: Family Medicine

## 2017-08-06 DIAGNOSIS — Z23 Encounter for immunization: Secondary | ICD-10-CM | POA: Diagnosis not present

## 2017-08-06 NOTE — Telephone Encounter (Signed)
Call in #[phone] with one rf 

## 2017-08-11 DIAGNOSIS — Z1231 Encounter for screening mammogram for malignant neoplasm of breast: Secondary | ICD-10-CM | POA: Diagnosis not present

## 2017-08-11 DIAGNOSIS — R1032 Left lower quadrant pain: Secondary | ICD-10-CM | POA: Diagnosis not present

## 2017-08-11 DIAGNOSIS — Z01419 Encounter for gynecological examination (general) (routine) without abnormal findings: Secondary | ICD-10-CM | POA: Diagnosis not present

## 2017-09-01 ENCOUNTER — Other Ambulatory Visit: Payer: Self-pay

## 2017-09-01 MED ORDER — HYDROCODONE-ACETAMINOPHEN 5-325 MG PO TABS: 1.0000 | ORAL_TABLET | Freq: Four times a day (QID) | ORAL | 0 refills | Status: DC | PRN

## 2017-09-01 NOTE — Telephone Encounter (Signed)
Done

## 2017-09-01 NOTE — Telephone Encounter (Signed)
Pt advised of new pain management policy and appt made.

## 2017-09-02 NOTE — Telephone Encounter (Signed)
Rx placed up front for pick up. Called pt. Pt advised and voiced understanding.

## 2017-09-15 ENCOUNTER — Encounter: Payer: Self-pay | Admitting: Family Medicine

## 2017-09-15 ENCOUNTER — Ambulatory Visit: Payer: 59 | Admitting: Family Medicine

## 2017-09-15 VITALS — BP 120/80 | HR 88 | Temp 98.2°F | Wt 152.6 lb

## 2017-09-15 DIAGNOSIS — M5440 Lumbago with sciatica, unspecified side: Secondary | ICD-10-CM

## 2017-09-15 DIAGNOSIS — G8929 Other chronic pain: Secondary | ICD-10-CM | POA: Diagnosis not present

## 2017-09-15 DIAGNOSIS — I10 Essential (primary) hypertension: Secondary | ICD-10-CM

## 2017-09-15 MED ORDER — HYDROCODONE-ACETAMINOPHEN 5-325 MG PO TABS: 1.0000 | ORAL_TABLET | Freq: Four times a day (QID) | ORAL | 0 refills | Status: DC | PRN

## 2017-09-15 NOTE — Progress Notes (Signed)
   Subjective:    Patient ID: Caroline Boone, female    DOB: [birth year], 51 y.o.   MRN: [id number]  HPI Here to discuss pain medications and to follow up BP. She feels well although the back pain flares up frequently. Her BP has been stable.    Review of Systems  Constitutional: Negative.   Respiratory: Negative.   Cardiovascular: Negative.   Musculoskeletal: Positive for back pain.  Neurological: Negative.        Objective:   Physical Exam  Constitutional: She is oriented to person, place, and time. She appears well-developed and well-nourished.  Cardiovascular: Normal rate, regular rhythm, normal heart sounds and intact distal pulses.  Pulmonary/Chest: Effort normal and breath sounds normal. No respiratory distress. She has no wheezes. She has no rales.  Musculoskeletal: Normal range of motion.  Neurological: She is alert and oriented to person, place, and time.          Assessment & Plan:  Her back pain is stable, Norco was refilled. Her HTN is well controlled.  Gershon CraneStephen Heaven Meeker, MD

## 2017-09-21 ENCOUNTER — Other Ambulatory Visit: Payer: Self-pay | Admitting: Family Medicine

## 2017-09-21 NOTE — Telephone Encounter (Signed)
Sent to PCP for approval.  

## 2017-09-21 NOTE — Telephone Encounter (Signed)
Call in #[phone] with one rf 

## 2017-09-22 ENCOUNTER — Emergency Department (HOSPITAL_COMMUNITY)
Admission: EM | Admit: 2017-09-22 | Discharge: 2017-09-22 | Disposition: A | Discharge status: Home / Self Care | Payer: 59 | Attending: Emergency Medicine | Admitting: Emergency Medicine

## 2017-09-22 ENCOUNTER — Emergency Department (HOSPITAL_COMMUNITY): Payer: 59

## 2017-09-22 ENCOUNTER — Ambulatory Visit: Payer: Self-pay | Admitting: *Deleted

## 2017-09-22 ENCOUNTER — Ambulatory Visit: Payer: 59 | Admitting: Family Medicine

## 2017-09-22 ENCOUNTER — Encounter (HOSPITAL_COMMUNITY): Payer: Self-pay | Admitting: Emergency Medicine

## 2017-09-22 ENCOUNTER — Other Ambulatory Visit: Payer: Self-pay

## 2017-09-22 DIAGNOSIS — I10 Essential (primary) hypertension: Secondary | ICD-10-CM | POA: Diagnosis not present

## 2017-09-22 DIAGNOSIS — R072 Precordial pain: Secondary | ICD-10-CM | POA: Insufficient documentation

## 2017-09-22 DIAGNOSIS — K219 Gastro-esophageal reflux disease without esophagitis: Secondary | ICD-10-CM | POA: Diagnosis not present

## 2017-09-22 DIAGNOSIS — R079 Chest pain, unspecified: Secondary | ICD-10-CM | POA: Diagnosis not present

## 2017-09-22 DIAGNOSIS — Z8679 Personal history of other diseases of the circulatory system: Secondary | ICD-10-CM | POA: Diagnosis not present

## 2017-09-22 DIAGNOSIS — R7989 Other specified abnormal findings of blood chemistry: Secondary | ICD-10-CM | POA: Diagnosis not present

## 2017-09-22 LAB — CBC
HEMATOCRIT: 41.8 % (ref 36.0–46.0)
Hemoglobin: 14.2 g/dL (ref 12.0–15.0)
MCH: 31.3 pg (ref 26.0–34.0)
MCHC: 34 g/dL (ref 30.0–36.0)
MCV: 92.1 fL (ref 78.0–100.0)
Platelets: 304 10*3/uL (ref 150–400)
RBC: 4.54 MIL/uL (ref 3.87–5.11)
RDW: 12.4 % (ref 11.5–15.5)
WBC: 8 10*3/uL (ref 4.0–10.5)

## 2017-09-22 LAB — I-STAT TROPONIN, ED
TROPONIN I, POC: 0.01 ng/mL (ref 0.00–0.08)
Troponin i, poc: 0 ng/mL (ref 0.00–0.08)

## 2017-09-22 LAB — BASIC METABOLIC PANEL
ANION GAP: 10 (ref 5–15)
BUN: 5 mg/dL — ABNORMAL LOW (ref 6–20)
CO2: 27 mmol/L (ref 22–32)
Calcium: 9.4 mg/dL (ref 8.9–10.3)
Chloride: 97 mmol/L — ABNORMAL LOW (ref 101–111)
Creatinine, Ser: 0.7 mg/dL (ref 0.44–1.00)
GFR calc Af Amer: >60 mL/min (ref >60)
Glucose, Bld: 101 mg/dL — ABNORMAL HIGH (ref 65–99)
POTASSIUM: 3.9 mmol/L (ref 3.5–5.1)
Sodium: 134 mmol/L — ABNORMAL LOW (ref 135–145)

## 2017-09-22 LAB — D-DIMER, QUANTITATIVE: D-Dimer, Quant: 1.28 ug/mL-FEU — ABNORMAL HIGH (ref 0.00–0.50)

## 2017-09-22 LAB — I-STAT BETA HCG BLOOD, ED (MC, WL, AP ONLY)

## 2017-09-22 MED ORDER — SODIUM CHLORIDE 0.9 % IV BOLUS (SEPSIS)
500.0000 mL | Freq: Once | INTRAVENOUS | Status: AC
  Administered 2017-09-22: 500 mL via INTRAVENOUS

## 2017-09-22 MED ORDER — ACETAMINOPHEN 500 MG PO TABS
1000.0000 mg | ORAL_TABLET | Freq: Once | ORAL | Status: DC
  Filled 2017-09-22: qty 2

## 2017-09-22 MED ORDER — IOPAMIDOL (ISOVUE-370) INJECTION 76%
INTRAVENOUS | Status: AC
  Administered 2017-09-22: 100 mL
  Filled 2017-09-22: qty 100

## 2017-09-22 NOTE — ED Provider Notes (Signed)
Emergency Department Provider Note   I have reviewed the triage vital signs and the nursing notes.   HISTORY  Chief Complaint Chest Pain   HPI Caroline Boone is a 51 y.o. female with PMH of carotid stenosis, Cerebral aneurysm, GERD, and HTN presents to the emergency department for evaluation of chest discomfort occurring intermittently over the past 24 hours.  Initial and worse episode was yesterday while at work.  She was eating lunch in her car when she began having central chest pressure which was nonradiating.  He seemed slightly short of breath had a flushed sensation in her face and numbness in bilateral palms.  He felt lightheaded like she might pass out so walked back into her workplace and splashed cool water on her face.  Symptoms resolved spontaneously and she completed her work day.  She denies any unilateral numbness or weakness.  No vision changes or voice changes. No vertigo.   Patient had 2 additional episodes throughout the evening but none today.  She recorded blood pressures at home in the 80s.  No nausea, vomiting, diarrhea.  No palpitations.   Past Medical History:  Diagnosis Date  . Anxiety   . Carotid artery injury   . Carotid stenosis    Dopplers 6/12:0-39% bilateral; distal ICAs with marked tortuosity; CT angiogram recommended;  [facility]; surgery to correct LICA stenosis and pseudoaneurysm too risky - > medical Rx;  dopplers 8/13: B/L 0-39% (stable)  . Cerebral aneurysm   . Chest pain    GXT echo 8/11: normal LVF, no ischemia;  h/o neg. myoview in 2005  . Fibromuscular dysplasia (HCC)    affects left vertebral,  artery, left ICA, left renal artery;  head and neck CTA 10/24/11: stable pattern of fibromuscular dysplasia of ICA and VA with distal changes suggesting prior pseudoaneurysm and dissection; L dist ICA 60%; normal MRI 10/29/11;  [facility]  . GERD (gastroesophageal reflux disease)   . Headache(784.0)   . History of  cardiac cath 2007   cath 11/25/05 by Dr. Jenne CampusMcQueen with normal cors and EF 50%  . Hypertension    a. echo 7/11: mod LVH, EF 55-65%  . Insomnia   . Palpitations    hx  . 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    Patient Active Problem List   Diagnosis Date Noted  . ADHD 02/23/2017  . Low back pain 06/18/2015  . Depression 06/18/2015  . Fibromuscular dysplasia (HCC) 11/04/2011  . Shortness of breath 03/20/2011  . Fatigue 03/20/2011  . Bruit 03/20/2011  . Hyperlipidemia 03/20/2011  . CHEST PAIN 05/20/2010  . MORTON'S NEUROMA 12/27/2009  . ARTHRITIS 12/27/2009  . Palpitations 03/27/2009  . Anxiety state 04/17/2008  . Essential hypertension 04/17/2008  . GERD 04/17/2008  . INSOMNIA 04/17/2008  . HEADACHE 04/17/2008    Past Surgical History:  Procedure Laterality Date  . BREAST ENHANCEMENT SURGERY  1991   Bilateral breast   . COLONOSCOPY  04/27/2014   per Dr. Loreta AveMann, tubular adenomas, repeat in 10 yrs     Current Outpatient Rx  . Order #: [id number] Class: Historical Med  . Order #: [id number] Class: Phone In  . Order #: [id number] Class: Historical Med  . Order #: [id number] Class: Normal  . Order #: [id number] Class: Normal  . Order #: [id number] Class: Print  . Order #: [id number] Class: Historical Med  . Order #: [id number] Class: Historical Med  .  Order #: [id number] Class: Print  . Order #: [id number] Class: Phone In  . Order #: [id number] Class: Normal  . Order #: [id number] Class: Phone In  . Order #: [id number] Class: Print  . Order #: [id number] Class: Historical Med  . Order #: [id number] Class: Normal    Allergies Amoxicillin and Sulfonamide derivatives  Family History  Problem Relation Age of Onset  . Coronary artery disease Mother   . Kidney disease Mother   . Heart disease Mother   . Heart attack Father 16       CABG  . Kidney failure Father   . Hypertension Father   . Heart disease Father   .  Arthritis Unknown        family hx  . Breast cancer Unknown        relative ,50  . Diabetes Unknown        family hx  . Hypertension Unknown        family hx  . Kidney disease Unknown        family hx  . Lung cancer Unknown        family hx    Social History Social History   Tobacco Use  . Smoking status: Never Smoker  . Smokeless tobacco: Never Used  Substance Use Topics  . Alcohol use: No    Alcohol/week: 0.0 oz  . Drug use: No    Review of Systems  Constitutional: No fever/chills Eyes: No visual changes. ENT: No sore throat. Cardiovascular: Positive chest pain. Respiratory: Positive shortness of breath. Gastrointestinal: No abdominal pain.  No nausea, no vomiting.  No diarrhea.  No constipation. Genitourinary: Negative for dysuria. Musculoskeletal: Negative for back pain. Skin: Negative for rash. Neurological: Negative for headaches, focal weakness or numbness.  10-point ROS otherwise negative.  ____________________________________________   PHYSICAL EXAM:  VITAL SIGNS: ED Triage Vitals [09/22/17 1116]  Enc Vitals Group     BP 123/77     Pulse Rate 92     Resp 18     Temp 97.7 F (36.5 C)     Temp Source Oral     SpO2 99 %    Constitutional: Alert and oriented. Well appearing and in no acute distress. Eyes: Conjunctivae are normal. PERRL. EOMI. Head: Atraumatic. Nose: No congestion/rhinnorhea. Mouth/Throat: Mucous membranes are moist.  Oropharynx non-erythematous. Neck: No stridor.  Cardiovascular: Normal rate, regular rhythm. Good peripheral circulation. Grossly normal heart sounds. Positive Bruit over the left carotid.  Respiratory: Normal respiratory effort.  No retractions. Lungs CTAB. Gastrointestinal: Soft and nontender. No distention.  Musculoskeletal: No lower extremity tenderness nor edema. No gross deformities of extremities. Neurologic:  Normal speech and language. No gross focal neurologic deficits are appreciated.  Skin:  Skin is  warm, dry and intact. No rash noted.   ____________________________________________   LABS (all labs ordered are listed, but only abnormal results are displayed)  Labs Reviewed  BASIC METABOLIC PANEL - Abnormal; Notable for the following components:      Result Value   Sodium 134 (*)    Chloride 97 (*)    Glucose, Bld 101 (*)    BUN 5 (*)    All other components within normal limits  D-DIMER, QUANTITATIVE (NOT AT [facility]) - Abnormal; Notable for the following components:   D-Dimer, Quant 1.28 (*)    All other components within normal limits  CBC  I-STAT TROPONIN, ED  I-STAT BETA HCG BLOOD, ED ([facility], [facility], [facility] ONLY)  I-STAT TROPONIN, ED   ____________________________________________  EKG   EKG Interpretation  Date/Time:  Wednesday September 22 2017 11:05:12 EST Ventricular Rate:  82 PR Interval:  118 QRS Duration: 90 QT Interval:  410 QTC Calculation: 479 R Axis:   19 Text Interpretation:  Sinus rhythm with frequent and consecutive Premature ventricular complexes Abnormal ECG No STEMI.  Confirmed by Alona BeneLong, Laylia Mui [id number]) on 09/22/2017 4:21:46 PM       ____________________________________________  RADIOLOGY  Dg Chest 2 View  Result Date: 09/22/2017 CLINICAL DATA:  Chest pain EXAM: CHEST  2 VIEW COMPARISON:  06/29/2013 FINDINGS: Cardiac shadow is stable. The lungs are well aerated bilaterally. Bilateral breast implants are again noted. No focal infiltrate or sizable effusion is seen. Scoliosis of the thoracolumbar spine is noted stable in appearance. IMPRESSION: No acute abnormality noted. Electronically Signed   By: Alcide CleverMark  Lukens M.D.   On: 09/22/2017 12:22    ____________________________________________   PROCEDURES  Procedure(s) performed:   Procedures  None ____________________________________________   INITIAL IMPRESSION / ASSESSMENT AND PLAN / ED COURSE  Pertinent labs & imaging results that were available during my care of the patient were reviewed by me and  considered in my medical decision making (see chart for details).  Patient presents emergency department for evaluation of intermittent chest discomfort with mild dyspnea, lightheadedness, and report of low blood pressures at home.  Blood pressure here is normal along with oxygen saturation.  She is low risk for PE by [place] but greater than 53 years of age so cannot apply PERC rule. Will add second troponin and d-dimer.  No neurological deficits after detailed neuro exam.  I do not feel that this constellation of symptoms would be caused by her known left carotid artery stenosis.  Bruit is appreciated.   05:06 PM Second troponin is negative. D-dimer elevated. Will move ahead with CTA to evaluate for PE. If negative for PE will have the patient f/u with PCP and Cardiology.   Sent ambulatory referral to Dr. Claudie FishermanHochrine with Cardiology but advised that the patient may need to get this from the PCP. Provided contact info as well.  ____________________________________________  FINAL CLINICAL IMPRESSION(S) / ED DIAGNOSES  Final diagnoses:  Precordial chest pain     MEDICATIONS GIVEN DURING THIS VISIT:  Medications  acetaminophen (TYLENOL) tablet 1,000 mg (1,000 mg Oral Refused 09/22/17 1830)  sodium chloride 0.9 % bolus 500 mL (0 mLs Intravenous Stopped 09/22/17 1831)  iopamidol (ISOVUE-370) 76 % injection (100 mLs  Contrast Given 09/22/17 1742)    Note:  This document was prepared using Dragon voice recognition software and may include unintentional dictation errors.  Alona BeneJoshua Torryn Hudspeth, MD Emergency Medicine    Rayman Petrosian, Arlyss RepressJoshua G, MD 09/22/17 2010

## 2017-09-22 NOTE — Discharge Instructions (Signed)

## 2017-09-22 NOTE — ED Notes (Signed)
Patient verbalizes understanding of discharge instructions. Opportunity for questioning and answers were provided. 

## 2017-09-22 NOTE — Telephone Encounter (Signed)
Pt is on the schedule for today 09/22/2017 to see Dr. Clent RidgesFry.

## 2017-09-22 NOTE — ED Triage Notes (Signed)
Pt to ER for evaluation of intermittent right chest pain with near syncope episodes. States hx of carotid dissection. Pt in NAD. States last night BP bottomed out to 80's. Pt states two days of lethargy and fatigue. VSS at this time. Pt in NAD.

## 2017-09-22 NOTE — Telephone Encounter (Signed)
  Reason for Disposition . Brief (now gone) dizziness or lightheadedness after standing up or eating  Answer Assessment - Initial Assessment Questions 1. BLOOD PRESSURE: "What is the blood pressure?" "Did you take at least two measurements 5 minutes apart?"     89/49 109/69 2. ONSET: "When did you take your blood pressure?"     Last night 3. HOW: "How did you obtain the blood pressure?" (e.g., visiting nurse, automatic home BP monitor)     Automatic machine 4. HISTORY: "Do you have a history of low blood pressure?" "What is your blood pressure normally?"     Normally it is high- she is on medication 5. MEDICATIONS: "Are you taking any medications for blood pressure?" If yes: "Have they been changed recently?"     Lisinopril, metoprolol  6. PULSE RATE: "Do you know what your pulse rate is?"      47 7. OTHER SYMPTOMS: "Have you been sick recently?" "Have you had a recent injury?"     no 8. PREGNANCY: "Is there any chance you are pregnant?" "When was your last menstrual period?"     n/a    Patient has had 3 episodes like this- she had one 6 months ago, then in Sept(she saw Dr Caryl NeverBurchette) and then yesterday. She states she feels fatigued and wiped out today. BP now 130/96   P  94  Protocols used: LOW BLOOD PRESSURE-A-AH

## 2017-09-23 ENCOUNTER — Telehealth: Payer: Self-pay | Admitting: Family Medicine

## 2017-09-23 NOTE — Telephone Encounter (Signed)
Seen in ED and referred to cardiology. Do you want to see her for ED follow-up as well? Please advise.

## 2017-09-23 NOTE — Telephone Encounter (Signed)
Copied from CRM [id number]. Topic: Quick Communication - See Telephone Encounter >> Sep 23, 2017  1:04 PM Crist InfanteHarrald, Kathy J wrote: CRM for notification. See Telephone encounter for:  Pt went to the ED yesterday at the advice [facility]. Pt states they mostly focused on her chest pain. (pt states CT scan was normal)  Pt states they did not check her carotid. Pt states ED refused to check her carotid. Pt feels like this episode may be associated with the symptoms she had yesterday. Pt wants to know if [facility] and [facility],(since it has been 2 yrs) or should she she Dr Clent RidgesFry for this first.  Pt aware Dr Clent RidgesFry is out for the afternoon.  09/23/17.

## 2017-09-23 NOTE — Telephone Encounter (Signed)
Rx was called in 

## 2017-09-24 NOTE — Telephone Encounter (Signed)
Pt is really concern and wanted to know is there any way possible that Dr. Clent RidgesFry could see her today rather than going to the ER again. Pt stated that when she went to the ER her BP was very low around 88/48 and today her BP is high at 147/100 and pulse is low. Pt wanted to have her coronary artery checked while at the ED but they did NOT do that. Sent to PCP we are full today could we fit her in if not should she go back to the ED?

## 2017-09-24 NOTE — Telephone Encounter (Signed)
I am sorry but I cannot see her today. After reading the ER note I suggest she increase the Metoprolol to twice daily. Also she should [facility] a [facility] to get the US on her carotids again

## 2017-09-24 NOTE — Telephone Encounter (Signed)
Pt advised and voiced understanding.   

## 2017-09-24 NOTE — Telephone Encounter (Signed)
Have her see me OV and we will go from there

## 2017-10-06 NOTE — Progress Notes (Deleted)
Cardiology Office Note   Date:  10/06/2017   ID:  Caroline Boone, DOB Mar 10, 1966, MRN [id number]  PCP:  Nelwyn Salisbury, MD  Cardiologist:   No primary care provider on file. Referring:  ***  No chief complaint on file.     History of Present Illness: Caroline Boone is a 51 y.o. female who is referred by *** for evaluation of chest pain.  She was in the ED earlier this month with chest pain.  I reviewed these records for this visit.   She had a CT negative for PE.  ***   I saw her in 2015 for evaluation of chest pain and carotid pseudoaneurysm.  In 2013 she had a coronary CT angiogram which demonstrated a calcium score of 0 and no suggestion of coronary disease.  She had been followed at [facility] for her carotid pseudoaneurysm and fibromuscular dysplasia. She apparently has stable anatomy.  However, I reviewed records from [facility] and I don't see any imaging since 2016.   ***   She has this followed routinely at [facility]    She presents today for followup. She's not sleeping well. He is very fatigued. She sometimes gets confused. She does have some chest discomfort. This has happened for a couple of weeks. Seems to be sporadic. It's a throbbing toothache discomfort that comes and goes at rest. She's not very active because she's so tired that she can bring it on with her usual daily activities. She's not had this before. There is no associated nausea vomiting or diaphoresis. She's had no presyncope or syncope. There is no new PND or orthopnea.   Past Medical History:  Diagnosis Date  . Anxiety   . Carotid artery injury   . Carotid stenosis    Dopplers 6/12:0-39% bilateral; distal ICAs with marked tortuosity; CT angiogram recommended;  followed at [facility]; surgery to correct LICA stenosis and pseudoaneurysm too risky - > medical Rx;  dopplers 8/13: B/L 0-39% (stable)  . Cerebral aneurysm   . Chest pain    GXT echo 8/11: normal LVF, no ischemia;  h/o neg. myoview in 2005  . Fibromuscular  dysplasia (HCC)    affects left vertebral,  artery, left ICA, left renal artery;  head and neck CTA 10/24/11: stable pattern of fibromuscular dysplasia of ICA and VA with distal changes suggesting prior pseudoaneurysm and dissection; L dist ICA 60%; normal MRI 10/29/11;  followed at [facility] with serial CT scans  . GERD (gastroesophageal reflux disease)   . Headache(784.0)   . History of cardiac cath 2007   cath 11/25/05 by Dr. Jenne Campus with normal cors and EF 50%  . Hypertension    a. echo 7/11: mod LVH, EF 55-65%  . Insomnia   . Palpitations    hx  . 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 Prescott Parma  at [facility]    Past Surgical History:  Procedure Laterality Date  . BREAST ENHANCEMENT SURGERY  1991   Bilateral breast   . COLONOSCOPY  04/27/2014   per Dr. Loreta Ave, tubular adenomas, repeat in 10 yrs      Current Outpatient Medications  Medication Sig Dispense Refill  . acetaminophen (TYLENOL) 500 MG tablet Take 1,000 mg by mouth every 6 (six) hours as needed for moderate pain.    Marland Kitchen ALPRAZolam (XANAX) 1 MG tablet TAKE 1 TABLET BY MOUTH THREE TIMES DAILY. 270 tablet 0  . amphetamine-dextroamphetamine (ADDERALL XR) 10 MG 24  hr capsule Take 1 capsule (10 mg total) by mouth daily. (Patient not taking: Reported on 09/15/2017) 30 capsule 0  . aspirin 81 MG tablet Take 81 mg by mouth daily.      . cloNIDine (CATAPRES) 0.1 MG tablet Take 0.1 mg by mouth as needed (for high BP; takes along with lisinopril as needed).     . cyclobenzaprine (FLEXERIL) 10 MG tablet take 1 tablet by mouth three times a day if needed for muscle spasm 90 tablet 5  . diclofenac sodium (VOLTAREN) 1 % GEL Apply 2 g topically 4 (four) times daily. (Patient not taking: Reported on 09/15/2017) 3 Tube 1  . hydrochlorothiazide (HYDRODIURIL) 25 MG tablet TAKE 1 TABLET BY MOUTH ONCE DAILY 30 tablet 0  . HYDROcodone-acetaminophen (NORCO) 5-325 MG tablet Take 1 tablet by mouth every 6  (six) hours as needed for moderate pain. 120 tablet 0  . lisinopril (PRINIVIL,ZESTRIL) 20 MG tablet Take 20 mg by mouth at bedtime.     . metoprolol succinate (TOPROL-XL) 25 MG 24 hr tablet Take 25 mg by mouth every morning.     Marland Kitchen. omeprazole (PRILOSEC) 40 MG capsule Take 1 capsule (40 mg total) by mouth daily. 30 capsule 5  . phentermine 30 MG capsule TAKE ONE CAPSULE BY MOUTH EVERY MORNING. 90 capsule 1  . promethazine (PHENERGAN) 25 MG tablet Take 1 tablet (25 mg total) by mouth every 4 (four) hours as needed for nausea or vomiting. 60 tablet 2  . zolpidem (AMBIEN) 10 MG tablet TAKE 1 TABLET BY MOUTH EVERY NIGHT AT BEDTIME AS NEEDED 30 tablet 5   No current facility-administered medications for this visit.     Allergies:   Amoxicillin and Sulfonamide derivatives    Social History:  The patient  reports that  has never smoked. she has never used smokeless tobacco. She reports that she does not drink alcohol or use drugs.   Family History:  The patient's ***family history includes Arthritis in her unknown relative; Breast cancer in her unknown relative; Coronary artery disease in her mother; Diabetes in her unknown relative; Heart attack (age of onset: 5671) in her father; Heart disease in her father and mother; Hypertension in her father and unknown relative; Kidney disease in her mother and unknown relative; Kidney failure in her father; Lung cancer in her unknown relative.    ROS:  Please see the history of present illness.   Otherwise, review of systems are positive for {NONE DEFAULTED:18576::"none"}.   All other systems are reviewed and negative.    PHYSICAL EXAM: VS:  LMP 06/14/2011  , BMI There is no height or weight on file to calculate BMI. GENERAL:  Well appearing HEENT:  Pupils equal round and reactive, fundi not visualized, oral mucosa unremarkable NECK:  No jugular venous distention, waveform within normal limits, carotid upstroke brisk and symmetric, no bruits, no  thyromegaly LYMPHATICS:  No cervical, inguinal adenopathy LUNGS:  Clear to auscultation bilaterally BACK:  No CVA tenderness CHEST:  Unremarkable HEART:  PMI not displaced or sustained,S1 and S2 within normal limits, no S3, no S4, no clicks, no rubs, *** murmurs ABD:  Flat, positive bowel sounds normal in frequency in pitch, no bruits, no rebound, no guarding, no midline pulsatile mass, no hepatomegaly, no splenomegaly EXT:  2 plus pulses throughout, no edema, no cyanosis no clubbing SKIN:  No rashes no nodules NEURO:  Cranial nerves II through XII grossly intact, motor grossly intact throughout PSYCH:  Cognitively intact, oriented to person place and time  EKG:  EKG {ACTION; IS/IS ZOX:[id number]}OT:[id number]} ordered today. The ekg ordered today demonstrates ***   Recent Labs: 05/06/2017: TSH 0.60 06/01/2017: ALT 11 09/22/2017: BUN 5; Creatinine, Ser 0.70; Hemoglobin 14.2; Platelets 304; Potassium 3.9; Sodium 134    Lipid Panel    Component Value Date/Time   CHOL 205 (H) 05/06/2017 0948   TRIG 49.0 05/06/2017 0948   HDL 80.90 05/06/2017 0948   CHOLHDL 3 05/06/2017 0948   VLDL 9.8 05/06/2017 0948   LDLCALC 114 (H) 05/06/2017 0948      Wt Readings from Last 3 Encounters:  09/15/17 152 lb 9.6 oz (69.2 kg)  06/01/17 174 lb (78.9 kg)  05/06/17 162 lb (73.5 kg)      Other studies Reviewed: Additional studies/ records that were reviewed today include: ***. Review of the above records demonstrates:  Please see elsewhere in the note.  ***   ASSESSMENT AND PLAN:  *** CHEST PAIN:  CAROTID PSEUDOANEURYSM:  ***  Current medicines are reviewed at length with the patient today.  The patient {ACTIONS; HAS/DOES NOT HAVE:19233} concerns regarding medicines.  The following changes have been made:  {PLAN; NO CHANGE:13088:s}  Labs/ tests ordered today include: *** No orders of the defined types were placed in this encounter.    Disposition:   FU with ***    Signed, Rollene RotundaJames Salayah Meares, MD   10/06/2017 10:16 PM    [facility]

## 2017-10-08 ENCOUNTER — Ambulatory Visit: Payer: 59 | Admitting: Cardiology

## 2017-10-18 DIAGNOSIS — I7774 Dissection of vertebral artery: Secondary | ICD-10-CM | POA: Diagnosis not present

## 2017-10-18 DIAGNOSIS — I7771 Dissection of carotid artery: Secondary | ICD-10-CM | POA: Diagnosis not present

## 2017-10-26 ENCOUNTER — Encounter: Payer: Self-pay | Admitting: Family Medicine

## 2017-10-26 ENCOUNTER — Ambulatory Visit: Payer: 59 | Admitting: Family Medicine

## 2017-10-26 VITALS — BP 116/70 | HR 82 | Temp 97.9°F | Wt 152.2 lb

## 2017-10-26 DIAGNOSIS — G8929 Other chronic pain: Secondary | ICD-10-CM | POA: Diagnosis not present

## 2017-10-26 DIAGNOSIS — M545 Low back pain: Secondary | ICD-10-CM | POA: Diagnosis not present

## 2017-10-26 DIAGNOSIS — F1190 Opioid use, unspecified, uncomplicated: Secondary | ICD-10-CM

## 2017-10-26 MED ORDER — HYDROCODONE-ACETAMINOPHEN 5-325 MG PO TABS: 1.0000 | ORAL_TABLET | Freq: Four times a day (QID) | ORAL | 0 refills | Status: DC | PRN

## 2017-10-26 NOTE — Progress Notes (Signed)
   Subjective:    Patient ID: Caroline Boone, female    DOB: Jan 15, 1966, 52 y.o.   MRN: [id number]  HPI Here for pain management. She is doing fairly well.  Indication for chronic opioid: low back pain Medication and dose: Norco 5-325 # pills per month: 120 Last UDS date: 10-26-17 Pain contract signed (Y/N): 10-26-17 Date narcotic database last reviewed (include red flags): 10-26-17    Review of Systems  Constitutional: Negative.   Respiratory: Negative.   Cardiovascular: Negative.   Musculoskeletal: Positive for back pain.  Neurological: Negative.        Objective:   Physical Exam  Constitutional: She is oriented to person, place, and time. She appears well-developed and well-nourished.  Cardiovascular: Normal rate, regular rhythm, normal heart sounds and intact distal pulses.  Pulmonary/Chest: Effort normal and breath sounds normal. No respiratory distress. She has no wheezes. She has no rales.  Neurological: She is alert and oriented to person, place, and time.          Assessment & Plan:  Stable low back pain. Meds were refilled.  Gershon CraneStephen Fry, MD

## 2017-10-30 LAB — PAIN MGMT, PROFILE 8 W/CONF, U
6 Acetylmorphine: NEGATIVE ng/mL (ref <10)
ALCOHOL METABOLITES: POSITIVE ng/mL — AB (ref <500)
ALPHAHYDROXYMIDAZOLAM: NEGATIVE ng/mL (ref <50)
AMINOCLONAZEPAM: NEGATIVE ng/mL (ref <25)
AMPHETAMINES: NEGATIVE ng/mL (ref <500)
Alphahydroxyalprazolam: 39 ng/mL — ABNORMAL HIGH (ref <25)
Alphahydroxytriazolam: NEGATIVE ng/mL (ref <50)
Benzodiazepines: POSITIVE ng/mL — AB (ref <100)
Buprenorphine, Urine: NEGATIVE ng/mL (ref <5)
COCAINE METABOLITE: NEGATIVE ng/mL (ref <150)
CREATININE: 15.2 mg/dL — AB
Codeine: NEGATIVE ng/mL (ref <50)
ETHYL GLUCURONIDE (ETG): 1350 ng/mL — AB (ref <500)
ETHYL SULFATE (ETS): 240 ng/mL — AB (ref <100)
Hydrocodone: NEGATIVE ng/mL (ref <50)
Hydromorphone: NEGATIVE ng/mL (ref <50)
Hydroxyethylflurazepam: NEGATIVE ng/mL (ref <50)
Lorazepam: NEGATIVE ng/mL (ref <50)
MDMA: NEGATIVE ng/mL (ref <500)
MORPHINE: NEGATIVE ng/mL (ref <50)
Marijuana Metabolite: NEGATIVE ng/mL (ref <20)
NORDIAZEPAM: NEGATIVE ng/mL (ref <50)
NORHYDROCODONE: 87 ng/mL — AB (ref <50)
OPIATES: POSITIVE ng/mL — AB (ref <100)
OXIDANT: NEGATIVE ug/mL (ref <200)
OXYCODONE: NEGATIVE ng/mL (ref <100)
Oxazepam: NEGATIVE ng/mL (ref <50)
PH: 7.27 (ref 4.5–9.0)
SPECIFIC GRAVITY: 1.003 (ref >=1.0)
TEMAZEPAM: NEGATIVE ng/mL (ref <50)

## 2017-11-22 DIAGNOSIS — I6522 Occlusion and stenosis of left carotid artery: Secondary | ICD-10-CM | POA: Diagnosis not present

## 2017-11-22 DIAGNOSIS — I7771 Dissection of carotid artery: Secondary | ICD-10-CM | POA: Diagnosis not present

## 2017-11-22 DIAGNOSIS — I720 Aneurysm of carotid artery: Secondary | ICD-10-CM | POA: Diagnosis not present

## 2017-11-22 DIAGNOSIS — I729 Aneurysm of unspecified site: Secondary | ICD-10-CM | POA: Diagnosis not present

## 2017-11-22 DIAGNOSIS — I726 Aneurysm of vertebral artery: Secondary | ICD-10-CM | POA: Diagnosis not present

## 2017-11-22 DIAGNOSIS — I7774 Dissection of vertebral artery: Secondary | ICD-10-CM | POA: Diagnosis not present

## 2017-12-09 ENCOUNTER — Encounter: Payer: Self-pay | Admitting: Cardiology

## 2017-12-15 ENCOUNTER — Encounter: Payer: Self-pay | Admitting: Cardiology

## 2017-12-15 NOTE — Progress Notes (Signed)
Cardiology Office Note   Date:  12/16/2017   ID:  Caroline Boone, DOB 07-06-1966, MRN [id number]  PCP:  Caroline Salisbury, MD  Cardiologist:   No primary care provider on file. Referring:  Caroline Parma, MD   No chief complaint on file.     History of Present Illness: Caroline Boone is a 52 y.o. female who is referred by Caroline Parma, MD for evaluation of palpitations.   In 2013 she had a coronary CT angiogram which demonstrated a calcium score of 0 and no suggestion of coronary disease.  She had been followed at [facility] for her carotid pseudoaneurysm and fibromuscular dysplasia. She apparently has stable anatomy.   She has this followed routinely at [facility].  I have not seen her for more than 3 years.   At that time we followed her atypical chest pian.   She was in the ED with chest pain and had a CT that was negative for PE.  I reviewed these records for this visit.    The patient has been seeing neurosurgery because of a carotid dissection.  This is been stable and is being managed medically now.  However because of spells of chest discomfort and palpitation she is referred.  In December she had couple of episodes of feeling flushed and presyncopal.  Her body feels like lead.  She gets nausea and some upset stomach.  Her eyes will get bloodshot.  She feels her hands hurting and tingling.  Her heart is beating hard.  Her blood pressure will go down afterwards.  It might last all for about 10 minutes.  She has to splash cold water on her face.  She is only had one episode in January.  She has not had any episodes in February.  This is somewhat similar to what she went to the emergency room for.  She is not really having any chest pressure, neck or arm discomfort.   Past Medical History:  Diagnosis Date  . Anxiety   . Carotid artery injury   . Carotid stenosis    Dopplers 6/12:0-39% bilateral; distal ICAs with marked tortuosity; CT angiogram recommended;  followed at [facility]; surgery to correct  LICA stenosis and pseudoaneurysm too risky - > medical Rx;  dopplers 8/13: B/L 0-39% (stable)  . Cerebral aneurysm   . Chest pain    GXT echo 8/11: normal LVF, no ischemia;  h/o neg. myoview in 2005  . Fibromuscular dysplasia (HCC)    affects left vertebral,  artery, left ICA, left renal artery;  head and neck CTA 10/24/11: stable pattern of fibromuscular dysplasia of ICA and VA with distal changes suggesting prior pseudoaneurysm and dissection; L dist ICA 60%; normal MRI 10/29/11;  followed at [facility] with serial CT scans  . GERD (gastroesophageal reflux disease)   . History of cardiac cath 2007   cath 11/25/05 by Dr. Jenne Campus with normal cors and EF 50%  . Hypertension    a. echo 7/11: mod LVH, EF 55-65%  . Insomnia   . Palpitations    hx  . 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 Prescott Boone  at [facility]    Past Surgical History:  Procedure Laterality Date  . BREAST ENHANCEMENT SURGERY  1991   Bilateral breast   . COLONOSCOPY  04/27/2014   per Dr. Loreta Ave, tubular adenomas, repeat in 10 yrs      Current Outpatient Medications  Medication Sig Dispense Refill  .  acetaminophen (TYLENOL) 500 MG tablet Take 1,000 mg by mouth every 6 (six) hours as needed for moderate pain.    Marland Kitchen ALPRAZolam (XANAX) 1 MG tablet TAKE 1 TABLET BY MOUTH THREE TIMES DAILY. 270 tablet 0  . aspirin 81 MG tablet Take 81 mg by mouth daily.      . cloNIDine (CATAPRES) 0.1 MG tablet Take 0.1 mg by mouth as needed (for high BP; takes along with lisinopril as needed).     . cyclobenzaprine (FLEXERIL) 10 MG tablet take 1 tablet by mouth three times a day if needed for muscle spasm 90 tablet 5  . hydrochlorothiazide (HYDRODIURIL) 25 MG tablet TAKE 1 TABLET BY MOUTH ONCE DAILY 30 tablet 0  . HYDROcodone-acetaminophen (NORCO) 5-325 MG tablet Take 1 tablet by mouth every 6 (six) hours as needed for moderate pain. 120 tablet 0  . lisinopril (PRINIVIL,ZESTRIL) 20 MG tablet Take 20  mg by mouth at bedtime.     . metoprolol succinate (TOPROL-XL) 25 MG 24 hr tablet Take 25 mg by mouth every morning.     Marland Kitchen omeprazole (PRILOSEC) 40 MG capsule Take 1 capsule (40 mg total) by mouth daily. 30 capsule 5  . phentermine 30 MG capsule TAKE ONE CAPSULE BY MOUTH EVERY MORNING. 90 capsule 1  . promethazine (PHENERGAN) 25 MG tablet Take 1 tablet (25 mg total) by mouth every 4 (four) hours as needed for nausea or vomiting. 60 tablet 2  . zolpidem (AMBIEN) 10 MG tablet TAKE 1 TABLET BY MOUTH EVERY NIGHT AT BEDTIME AS NEEDED 30 tablet 5   No current facility-administered medications for this visit.     Allergies:   Amoxicillin and Sulfonamide derivatives    Social History:  The patient  reports that  has never smoked. she has never used smokeless tobacco. She reports that she does not drink alcohol or use drugs.   Family History:  The patient's family history includes Arthritis in her unknown relative; Breast cancer in her unknown relative; Coronary artery disease in her mother; Diabetes in her unknown relative; Heart attack (age of onset: 74) in her father; Heart disease in her father; Heart disease (age of onset: 33) in her mother; Hypertension in her father and unknown relative; Kidney disease in her mother and unknown relative; Kidney failure in her father; Lung cancer in her unknown relative.    ROS:  Please see the history of present illness.   Otherwise, review of systems are positive for none.   All other systems are reviewed and negative.    PHYSICAL EXAM: VS:  BP 138/90   Pulse 85   Ht 5\' 2"  (1.575 m)   Wt 155 lb (70.3 kg)   LMP 06/14/2011   BMI 28.35 kg/m  , BMI Body mass index is 28.35 kg/m. GENERAL:  Well appearing HEENT:  Pupils equal round and reactive, fundi not visualized, oral mucosa unremarkable NECK:  No jugular venous distention, waveform within normal limits, carotid upstroke brisk and symmetric, LEFTbruits, no thyromegaly LYMPHATICS:  No cervical, inguinal  adenopathy LUNGS:  Clear to auscultation bilaterally BACK:  No CVA tenderness CHEST:  Unremarkable HEART:  PMI not displaced or sustained,S1 and S2 within normal limits, no S3, no S4, no clicks, no rubs, no murmurs ABD:  Flat, positive bowel sounds normal in frequency in pitch, no bruits, no rebound, no guarding, no midline pulsatile mass, no hepatomegaly, no splenomegaly EXT:  2 plus pulses throughout, no edema, no cyanosis no clubbing SKIN:  No rashes no nodules NEURO:  Cranial nerves II through XII grossly intact, motor grossly intact throughout PSYCH:  Cognitively intact, oriented to person place and time    EKG:  EKG is not ordered today. The ekg ordered 09/22/17 demonstrates sinus rhythm, rate 82, premature ventricular contractions, poor anterior R wave progression, no acute ST-T wave changes.   Recent Labs: 05/06/2017: TSH 0.60 06/01/2017: ALT 11 09/22/2017: BUN 5; Creatinine, Ser 0.70; Hemoglobin 14.2; Platelets 304; Potassium 3.9; Sodium 134    Lipid Panel    Component Value Date/Time   CHOL 205 (H) 05/06/2017 0948   TRIG 49.0 05/06/2017 0948   HDL 80.90 05/06/2017 0948   CHOLHDL 3 05/06/2017 0948   VLDL 9.8 05/06/2017 0948   LDLCALC 114 (H) 05/06/2017 0948      Wt Readings from Last 3 Encounters:  12/16/17 155 lb (70.3 kg)  10/26/17 152 lb 3.2 oz (69 kg)  09/15/17 152 lb 9.6 oz (69.2 kg)      Other studies Reviewed: Additional studies/ records that were reviewed today include: ED records. Review of the above records demonstrates:  Please see elsewhere in the note.     ASSESSMENT AND PLAN:  Palpitations - I am going to start with an event monitor. Nena PolioBillie Jo Delcarlo will need a 21 day event monitor.   She is going to look into getting Alive Cor as well.  I will see her after this.  TSH was low normal last year and I will repeat this when I see her.  HYPERTENSION -  The blood pressure is at target. No change in medications is indicated. We will continue with  therapeutic lifestyle changes (TLC).  Fibromuscular dysplasia -  This is now being followed conservatively.  She has a dissection in the carotid.  No change in therapy is indicated.    Current medicines are reviewed at length with the patient today.  The patient does not have concerns regarding medicines.  The following changes have been made:  no change  Labs/ tests ordered today include:   Orders Placed This Encounter  Procedures  . CARDIAC EVENT MONITOR     Disposition:   FU with me after the event monitor.      Signed, Rollene RotundaJames Dayanna Pryce, MD  12/16/2017 3:12 PM    [facility]

## 2017-12-16 ENCOUNTER — Encounter: Payer: Self-pay | Admitting: Cardiology

## 2017-12-16 ENCOUNTER — Ambulatory Visit: Payer: 59 | Admitting: Cardiology

## 2017-12-16 VITALS — BP 138/90 | HR 85 | Ht 62.0 in | Wt 155.0 lb

## 2017-12-16 DIAGNOSIS — R002 Palpitations: Secondary | ICD-10-CM

## 2017-12-16 DIAGNOSIS — I10 Essential (primary) hypertension: Secondary | ICD-10-CM | POA: Diagnosis not present

## 2017-12-16 NOTE — Patient Instructions (Addendum)
Medication Instructions:  Continue current medications  If you need a refill on your cardiac medications before your next appointment, please call your pharmacy.  Labwork: None Ordered   Testing/Procedures: Your physician has recommended that you wear an event monitor 30 days. Event monitors are medical devices that record the heart's electrical activity. Doctors most often us these monitors to diagnose arrhythmias. Arrhythmias are problems with the speed or rhythm of the heartbeat. The monitor is a small, portable device. You can wear one while you do your normal daily activities. This is usually used to diagnose what is causing palpitations/syncope (passing out).  Follow-Up: Your physician wants you to follow-up in: After Monitor.     Thank you for choosing [facility] at [facility]!!

## 2017-12-22 ENCOUNTER — Other Ambulatory Visit: Payer: Self-pay | Admitting: Family Medicine

## 2017-12-23 NOTE — Telephone Encounter (Signed)
[facility]   Last OV 10/26/2017   Last refilled 07/09/2017 disp 30 with 5 refills

## 2017-12-23 NOTE — Telephone Encounter (Signed)
Call in #[phone] with 5 rf 

## 2017-12-23 NOTE — Telephone Encounter (Signed)
Sent to PCP for approval   Last OV   Last refilled

## 2017-12-28 ENCOUNTER — Ambulatory Visit (INDEPENDENT_AMBULATORY_CARE_PROVIDER_SITE_OTHER): Payer: 59

## 2017-12-28 DIAGNOSIS — R002 Palpitations: Secondary | ICD-10-CM

## 2017-12-31 DIAGNOSIS — J019 Acute sinusitis, unspecified: Secondary | ICD-10-CM | POA: Diagnosis not present

## 2018-01-01 ENCOUNTER — Ambulatory Visit: Payer: 59 | Admitting: Family Medicine

## 2018-01-11 ENCOUNTER — Telehealth: Payer: Self-pay | Admitting: Physician Assistant

## 2018-01-11 ENCOUNTER — Observation Stay (HOSPITAL_COMMUNITY)
Admission: EM | Admit: 2018-01-11 | Discharge: 2018-01-14 | Disposition: A | Discharge status: Home / Self Care | Payer: 59 | Attending: Internal Medicine | Admitting: Internal Medicine

## 2018-01-11 ENCOUNTER — Other Ambulatory Visit: Payer: Self-pay

## 2018-01-11 ENCOUNTER — Emergency Department (HOSPITAL_COMMUNITY): Payer: 59

## 2018-01-11 ENCOUNTER — Telehealth: Payer: Self-pay | Admitting: *Deleted

## 2018-01-11 ENCOUNTER — Encounter (HOSPITAL_COMMUNITY): Payer: Self-pay | Admitting: *Deleted

## 2018-01-11 DIAGNOSIS — F419 Anxiety disorder, unspecified: Secondary | ICD-10-CM | POA: Diagnosis not present

## 2018-01-11 DIAGNOSIS — I472 Ventricular tachycardia, unspecified: Secondary | ICD-10-CM

## 2018-01-11 DIAGNOSIS — R0602 Shortness of breath: Secondary | ICD-10-CM | POA: Insufficient documentation

## 2018-01-11 DIAGNOSIS — Z79899 Other long term (current) drug therapy: Secondary | ICD-10-CM | POA: Insufficient documentation

## 2018-01-11 DIAGNOSIS — R079 Chest pain, unspecified: Secondary | ICD-10-CM | POA: Diagnosis not present

## 2018-01-11 DIAGNOSIS — W19XXXA Unspecified fall, initial encounter: Secondary | ICD-10-CM | POA: Insufficient documentation

## 2018-01-11 DIAGNOSIS — Z880 Allergy status to penicillin: Secondary | ICD-10-CM | POA: Diagnosis not present

## 2018-01-11 DIAGNOSIS — K219 Gastro-esophageal reflux disease without esophagitis: Secondary | ICD-10-CM | POA: Diagnosis not present

## 2018-01-11 DIAGNOSIS — R0789 Other chest pain: Secondary | ICD-10-CM | POA: Diagnosis not present

## 2018-01-11 DIAGNOSIS — Z882 Allergy status to sulfonamides status: Secondary | ICD-10-CM | POA: Insufficient documentation

## 2018-01-11 DIAGNOSIS — I773 Arterial fibromuscular dysplasia: Secondary | ICD-10-CM | POA: Diagnosis not present

## 2018-01-11 DIAGNOSIS — I7771 Dissection of carotid artery: Secondary | ICD-10-CM

## 2018-01-11 DIAGNOSIS — I4729 Other ventricular tachycardia: Secondary | ICD-10-CM

## 2018-01-11 DIAGNOSIS — I10 Essential (primary) hypertension: Secondary | ICD-10-CM | POA: Diagnosis present

## 2018-01-11 DIAGNOSIS — Z8249 Family history of ischemic heart disease and other diseases of the circulatory system: Secondary | ICD-10-CM | POA: Insufficient documentation

## 2018-01-11 DIAGNOSIS — R42 Dizziness and giddiness: Secondary | ICD-10-CM | POA: Diagnosis not present

## 2018-01-11 DIAGNOSIS — R002 Palpitations: Secondary | ICD-10-CM

## 2018-01-11 DIAGNOSIS — R531 Weakness: Secondary | ICD-10-CM | POA: Insufficient documentation

## 2018-01-11 DIAGNOSIS — Z7982 Long term (current) use of aspirin: Secondary | ICD-10-CM | POA: Insufficient documentation

## 2018-01-11 HISTORY — DX: Headache, unspecified: R51.9

## 2018-01-11 HISTORY — DX: Headache: R51

## 2018-01-11 HISTORY — DX: Family history of other specified conditions: Z84.89

## 2018-01-11 LAB — CBC
HCT: 37.8 % (ref 36.0–46.0)
HEMOGLOBIN: 12.4 g/dL (ref 12.0–15.0)
MCH: 30.4 pg (ref 26.0–34.0)
MCHC: 32.8 g/dL (ref 30.0–36.0)
MCV: 92.6 fL (ref 78.0–100.0)
PLATELETS: 345 10*3/uL (ref 150–400)
RBC: 4.08 MIL/uL (ref 3.87–5.11)
RDW: 12.7 % (ref 11.5–15.5)
WBC: 6.9 10*3/uL (ref 4.0–10.5)

## 2018-01-11 LAB — BASIC METABOLIC PANEL
ANION GAP: 11 (ref 5–15)
BUN: 7 mg/dL (ref 6–20)
CALCIUM: 9.4 mg/dL (ref 8.9–10.3)
CO2: 26 mmol/L (ref 22–32)
CREATININE: 0.56 mg/dL (ref 0.44–1.00)
Chloride: 98 mmol/L — ABNORMAL LOW (ref 101–111)
Glucose, Bld: 97 mg/dL (ref 65–99)
Potassium: 3.6 mmol/L (ref 3.5–5.1)
Sodium: 135 mmol/L (ref 135–145)

## 2018-01-11 LAB — I-STAT TROPONIN, ED: Troponin i, poc: 0.01 ng/mL (ref 0.00–0.08)

## 2018-01-11 LAB — I-STAT BETA HCG BLOOD, ED (MC, WL, AP ONLY): I-stat hCG, quantitative: <5 m[IU]/mL (ref <5)

## 2018-01-11 NOTE — Telephone Encounter (Signed)
I would like to make sure her coronary arteries are OK given her ventricular ectopy.  However, I don't want to do a stress test with a history of carotid dissection.  I would like to have a coronary CTA.  Please call to arrange.

## 2018-01-11 NOTE — Telephone Encounter (Signed)
Received after hours page from [employer] for another run of NSVT.  Pt seen 12/16/17 for palpitations, chest discomfort, and presyncope. She also has h/o carotid dissection -> event monitor ordered.  Information reviewed: - Last labs 09/2017 with K 3.9. No recent Mg on file.  - Last EKG 09/2017 demonstrated NSR with frequent PVCs. - Last EF 55-60% in 2012.  Per phone note, office received notification earlier today of a 5 beat run of NSVT and PVCs. Cardiac CTA was ordered to r/o ischemia.  [employer] called back this PM for another run of NSVT meeting their protocol, 14 beats total VT, rate reached 217 - underlying rhythm sinus. [employer] was unable to reach patient. I called patient twice but no response - LMOM to call back. I called her emergency contact on DPR and he said she was out with her daughter right now. He was going to call the daughter and ask the patient to call us back ASAP.  She did so, stating she's been caring for her daughter who has the flu. She has been under a lot of stress today and has noticed a few episodes of intermittent chest pain feeling like indigestion earlier. Given the above NSVT and chest discomfort I've recommended she be evaluated in the emergency room ASAP. We discussed possibility of calling 911 if ongoing symptoms. She knows under no circumstances should she drive herself. She will need eval by EDP for lytes and ischemic w/u. She verbalized understanding and gratitude.  Dayna Dunn PA-C

## 2018-01-11 NOTE — ED Triage Notes (Signed)
Pt sees Dr.Hochrien and was placed on a heart monitor for irregular heart rate. Pt was contacted today by PA and told she had 14 beat run of ventricular tachycardia. Pt has been having chest discomfort/heaviness throughout the day with pain radiating to back and c/o mild sob. Also reports having a very stressful day and has been taking care of her daughter who has the flu

## 2018-01-11 NOTE — Telephone Encounter (Signed)
Received a report from [employer] for auto trigger, report read as VT 5 beats, R with PVC's 4 in 1 minute. Left message for patient to call back to see how she is feeling. Strips given to RN with Dr Antoine PocheHochrein for review

## 2018-01-11 NOTE — Addendum Note (Signed)
Addended by: Regis BillPRATT, Florinda Taflinger B on: 01/11/2018 06:26 PM   Modules accepted: Orders

## 2018-01-11 NOTE — Telephone Encounter (Signed)
Order placed, awaiting patients callback

## 2018-01-12 ENCOUNTER — Encounter (HOSPITAL_COMMUNITY): Payer: Self-pay | Admitting: Internal Medicine

## 2018-01-12 ENCOUNTER — Ambulatory Visit (HOSPITAL_COMMUNITY)
Admission: EM | Disposition: A | Discharge status: Home / Self Care | Payer: Self-pay | Source: Home / Self Care | Attending: Emergency Medicine

## 2018-01-12 DIAGNOSIS — R0789 Other chest pain: Secondary | ICD-10-CM | POA: Diagnosis not present

## 2018-01-12 DIAGNOSIS — R079 Chest pain, unspecified: Secondary | ICD-10-CM | POA: Diagnosis present

## 2018-01-12 DIAGNOSIS — I10 Essential (primary) hypertension: Secondary | ICD-10-CM

## 2018-01-12 DIAGNOSIS — I472 Ventricular tachycardia: Secondary | ICD-10-CM | POA: Diagnosis not present

## 2018-01-12 DIAGNOSIS — I4729 Other ventricular tachycardia: Secondary | ICD-10-CM

## 2018-01-12 HISTORY — PX: LEFT HEART CATH AND CORONARY ANGIOGRAPHY: CATH118249

## 2018-01-12 LAB — CBC
HCT: 38.5 % (ref 36.0–46.0)
Hemoglobin: 13 g/dL (ref 12.0–15.0)
MCH: 31.2 pg (ref 26.0–34.0)
MCHC: 33.8 g/dL (ref 30.0–36.0)
MCV: 92.3 fL (ref 78.0–100.0)
Platelets: 351 10*3/uL (ref 150–400)
RBC: 4.17 MIL/uL (ref 3.87–5.11)
RDW: 12.7 % (ref 11.5–15.5)
WBC: 6.6 10*3/uL (ref 4.0–10.5)

## 2018-01-12 LAB — ETHANOL

## 2018-01-12 LAB — MAGNESIUM: MAGNESIUM: 2 mg/dL (ref 1.7–2.4)

## 2018-01-12 LAB — TROPONIN I: Troponin I: <0.03 ng/mL (ref <0.03)

## 2018-01-12 LAB — I-STAT TROPONIN, ED: Troponin i, poc: 0 ng/mL (ref 0.00–0.08)

## 2018-01-12 LAB — CREATININE, SERUM
CREATININE: 0.5 mg/dL (ref 0.44–1.00)
GFR calc non Af Amer: >60 mL/min (ref >60)

## 2018-01-12 LAB — TSH: TSH: 1.276 u[IU]/mL (ref 0.350–4.500)

## 2018-01-12 SURGERY — LEFT HEART CATH AND CORONARY ANGIOGRAPHY
Anesthesia: LOCAL

## 2018-01-12 MED ORDER — MIDAZOLAM HCL 2 MG/2ML IJ SOLN
INTRAMUSCULAR | Status: AC
  Filled 2018-01-12: qty 2

## 2018-01-12 MED ORDER — SODIUM CHLORIDE 0.9% FLUSH: 3.0000 mL | INTRAVENOUS | Status: DC | PRN

## 2018-01-12 MED ORDER — MIDAZOLAM HCL 2 MG/2ML IJ SOLN
INTRAMUSCULAR | Status: DC | PRN
  Administered 2018-01-12 (×2): 1 mg via INTRAVENOUS

## 2018-01-12 MED ORDER — POTASSIUM CHLORIDE CRYS ER 20 MEQ PO TBCR
40.0000 meq | EXTENDED_RELEASE_TABLET | Freq: Once | ORAL | Status: AC
  Administered 2018-01-12: 40 meq via ORAL
  Filled 2018-01-12: qty 2

## 2018-01-12 MED ORDER — ASPIRIN 81 MG PO CHEW: 81.0000 mg | CHEWABLE_TABLET | ORAL | Status: DC

## 2018-01-12 MED ORDER — ACETAMINOPHEN 80 MG PO CHEW: 500.0000 mg | CHEWABLE_TABLET | Freq: Four times a day (QID) | ORAL | Status: DC | PRN

## 2018-01-12 MED ORDER — HYDROCODONE-ACETAMINOPHEN 5-325 MG PO TABS
1.0000 | ORAL_TABLET | Freq: Four times a day (QID) | ORAL | Status: DC | PRN
  Administered 2018-01-12: 1 via ORAL
  Filled 2018-01-12: qty 1

## 2018-01-12 MED ORDER — ASPIRIN EC 325 MG PO TBEC
325.0000 mg | DELAYED_RELEASE_TABLET | Freq: Every day | ORAL | Status: DC
  Administered 2018-01-13 – 2018-01-14 (×2): 325 mg via ORAL
  Filled 2018-01-12 (×3): qty 1

## 2018-01-12 MED ORDER — SODIUM CHLORIDE 0.9 % WEIGHT BASED INFUSION
204.0000 mL/h | INTRAVENOUS | Status: DC
  Administered 2018-01-12: 3 mL/kg/h via INTRAVENOUS

## 2018-01-12 MED ORDER — MORPHINE SULFATE (PF) 4 MG/ML IV SOLN: 2.0000 mg | INTRAVENOUS | Status: DC | PRN

## 2018-01-12 MED ORDER — ACETAMINOPHEN 325 MG PO TABS
650.0000 mg | ORAL_TABLET | Freq: Once | ORAL | Status: AC
  Administered 2018-01-12: 650 mg via ORAL
  Filled 2018-01-12: qty 2

## 2018-01-12 MED ORDER — SODIUM CHLORIDE 0.9% FLUSH: 3.0000 mL | Freq: Two times a day (BID) | INTRAVENOUS | Status: DC

## 2018-01-12 MED ORDER — METOPROLOL SUCCINATE ER 25 MG PO TB24
25.0000 mg | ORAL_TABLET | Freq: Every morning | ORAL | Status: DC
  Administered 2018-01-12: 25 mg via ORAL
  Filled 2018-01-12: qty 1

## 2018-01-12 MED ORDER — ENOXAPARIN SODIUM 40 MG/0.4ML ~~LOC~~ SOLN: 40.0000 mg | SUBCUTANEOUS | Status: DC

## 2018-01-12 MED ORDER — LIDOCAINE HCL (PF) 1 % IJ SOLN
INTRAMUSCULAR | Status: AC
  Filled 2018-01-12: qty 30

## 2018-01-12 MED ORDER — FENTANYL CITRATE (PF) 100 MCG/2ML IJ SOLN
INTRAMUSCULAR | Status: DC | PRN
  Administered 2018-01-12 (×2): 25 ug via INTRAVENOUS

## 2018-01-12 MED ORDER — VERAPAMIL HCL 2.5 MG/ML IV SOLN
INTRAVENOUS | Status: DC | PRN
  Administered 2018-01-12: 10 mL via INTRA_ARTERIAL

## 2018-01-12 MED ORDER — HEPARIN SODIUM (PORCINE) 1000 UNIT/ML IJ SOLN
INTRAMUSCULAR | Status: DC | PRN
  Administered 2018-01-12: 3500 [IU] via INTRAVENOUS

## 2018-01-12 MED ORDER — HEPARIN (PORCINE) IN NACL 2-0.9 UNIT/ML-% IJ SOLN
INTRAMUSCULAR | Status: AC
  Filled 2018-01-12: qty 1000

## 2018-01-12 MED ORDER — SODIUM CHLORIDE 0.9 % WEIGHT BASED INFUSION: 3.0000 mL/kg/h | INTRAVENOUS | Status: DC

## 2018-01-12 MED ORDER — ASPIRIN 81 MG PO CHEW
324.0000 mg | CHEWABLE_TABLET | Freq: Once | ORAL | Status: AC
  Administered 2018-01-12: 324 mg via ORAL
  Filled 2018-01-12: qty 4

## 2018-01-12 MED ORDER — SODIUM CHLORIDE 0.9 % IV SOLN: 250.0000 mL | INTRAVENOUS | Status: DC | PRN

## 2018-01-12 MED ORDER — LISINOPRIL 20 MG PO TABS
20.0000 mg | ORAL_TABLET | Freq: Every day | ORAL | Status: DC
  Administered 2018-01-13: 20 mg via ORAL
  Filled 2018-01-12 (×2): qty 1

## 2018-01-12 MED ORDER — HEPARIN SODIUM (PORCINE) 1000 UNIT/ML IJ SOLN
INTRAMUSCULAR | Status: AC
  Filled 2018-01-12: qty 1

## 2018-01-12 MED ORDER — NITROGLYCERIN 0.4 MG SL SUBL
0.4000 mg | SUBLINGUAL_TABLET | SUBLINGUAL | Status: DC | PRN
Start: 2018-01-12 — End: 2018-01-14
  Administered 2018-01-12 (×2): 0.4 mg via SUBLINGUAL
  Filled 2018-01-12: qty 1

## 2018-01-12 MED ORDER — ENOXAPARIN SODIUM 40 MG/0.4ML ~~LOC~~ SOLN
40.0000 mg | SUBCUTANEOUS | Status: DC
  Filled 2018-01-12: qty 0.4

## 2018-01-12 MED ORDER — HEPARIN (PORCINE) IN NACL 2-0.9 UNIT/ML-% IJ SOLN
INTRAMUSCULAR | Status: AC | PRN
  Administered 2018-01-12 (×2): 500 mL

## 2018-01-12 MED ORDER — SODIUM CHLORIDE 0.9% FLUSH
3.0000 mL | Freq: Two times a day (BID) | INTRAVENOUS | Status: DC
  Administered 2018-01-13 (×2): 3 mL via INTRAVENOUS

## 2018-01-12 MED ORDER — ACETAMINOPHEN 325 MG PO TABS
650.0000 mg | ORAL_TABLET | ORAL | Status: DC | PRN
  Administered 2018-01-12: 650 mg via ORAL
  Filled 2018-01-12: qty 2

## 2018-01-12 MED ORDER — VERAPAMIL HCL 2.5 MG/ML IV SOLN
INTRAVENOUS | Status: AC
  Filled 2018-01-12: qty 2

## 2018-01-12 MED ORDER — IOPAMIDOL (ISOVUE-370) INJECTION 76%
INTRAVENOUS | Status: DC | PRN
  Administered 2018-01-12: 65 mL via INTRAVENOUS

## 2018-01-12 MED ORDER — ONDANSETRON HCL 4 MG/2ML IJ SOLN
4.0000 mg | Freq: Four times a day (QID) | INTRAMUSCULAR | Status: DC | PRN
  Administered 2018-01-13: 4 mg via INTRAVENOUS
  Filled 2018-01-12: qty 2

## 2018-01-12 MED ORDER — FENTANYL CITRATE (PF) 100 MCG/2ML IJ SOLN
INTRAMUSCULAR | Status: AC
  Filled 2018-01-12: qty 2

## 2018-01-12 MED ORDER — SODIUM CHLORIDE 0.9 % WEIGHT BASED INFUSION
1.0000 mL/kg/h | INTRAVENOUS | Status: DC
  Administered 2018-01-12: 1 mL/kg/h via INTRAVENOUS

## 2018-01-12 MED ORDER — SODIUM CHLORIDE 0.9 % WEIGHT BASED INFUSION
1.0000 mL/kg/h | INTRAVENOUS | Status: AC
  Administered 2018-01-12: 1 mL/kg/h via INTRAVENOUS

## 2018-01-12 MED ORDER — SODIUM CHLORIDE 0.9 % WEIGHT BASED INFUSION: 1.0000 mL/kg/h | INTRAVENOUS | Status: DC

## 2018-01-12 MED ORDER — IOPAMIDOL (ISOVUE-370) INJECTION 76%
INTRAVENOUS | Status: AC
  Filled 2018-01-12: qty 100

## 2018-01-12 SURGICAL SUPPLY — 16 items
BAND ZEPHYR COMPRESS 30 LONG (HEMOSTASIS) ×2 IMPLANT
CATH INFINITI 5 FR JL3.5 (CATHETERS) ×2 IMPLANT
CATH INFINITI 5FR ANG PIGTAIL (CATHETERS) ×2 IMPLANT
CATH INFINITI JR4 5F (CATHETERS) ×2 IMPLANT
COVER PRB 48X5XTLSCP FOLD TPE (BAG) ×1 IMPLANT
COVER PROBE 5X48 (BAG) ×1
GUIDEWIRE INQWIRE 1.5J.035X260 (WIRE) ×1 IMPLANT
INQWIRE 1.5J .035X260CM (WIRE) ×2
KIT HEART LEFT (KITS) ×2 IMPLANT
NEEDLE PERC 21GX4CM (NEEDLE) ×2 IMPLANT
PACK CARDIAC CATHETERIZATION (CUSTOM PROCEDURE TRAY) ×2 IMPLANT
SHEATH RAIN RADIAL 21G 6FR (SHEATH) ×2 IMPLANT
SYR MEDRAD MARK V 150ML (SYRINGE) ×2 IMPLANT
TRANSDUCER W/STOPCOCK (MISCELLANEOUS) ×2 IMPLANT
TUBING CIL FLEX 10 FLL-RA (TUBING) ×2 IMPLANT
WIRE HI TORQ VERSACORE-J 145CM (WIRE) ×2 IMPLANT

## 2018-01-12 NOTE — ED Provider Notes (Signed)
TIME SEEN: 3:42 AM  CHIEF COMPLAINT: Chest pain  HPI: Patient is a 52 year old female with history of hypertension, previous carotid dissection, fibromuscular dysplasia who presents to the emergency department with chest pain.  States she has been under a lot of stress recently and has had diffuse chest pressure, tightness without radiation.  Reports associated shortness of breath, diaphoresis and dizziness.  No nausea or vomiting.  No lower extremity swelling or pain.  Has been having palpitations and near syncope recently and has been wearing an event monitor.  Patient had 5 beat run of nonsustained ventricular tachycardia and PVCs and a cardiac CTA had been ordered.  Later in the evening patient had another run of nonsustained ventricular tachycardia that was 14 beats total with a rate of 217.  Cardiology PA contacted patient who reported chest pain and they instructed she come to the emergency department.  Still having a 7/10 pain.  No aggravating or relieving factors.  Has had previous stress test and cardiac catheterization but this was done many years ago.   ROS: See HPI Constitutional: no fever  Eyes: no drainage  ENT: no runny nose   Cardiovascular:   chest pain  Resp:  SOB  GI: no vomiting GU: no dysuria Integumentary: no rash  Allergy: no hives  Musculoskeletal: no leg swelling  Neurological: no slurred speech ROS otherwise negative  PAST MEDICAL HISTORY/PAST SURGICAL HISTORY:  Past Medical History:  Diagnosis Date  . Anxiety   . Carotid artery injury   . Carotid stenosis    Dopplers 6/12:0-39% bilateral; distal ICAs with marked tortuosity; CT angiogram recommended;  followed at [facility]; surgery to correct LICA stenosis and pseudoaneurysm too risky - > medical Rx;  dopplers 8/13: B/L 0-39% (stable)  . Cerebral aneurysm   . Chest pain    GXT echo 8/11: normal LVF, no ischemia;  h/o neg. myoview in 2005  . Fibromuscular dysplasia (HCC)    affects left vertebral,  artery, left  ICA, left renal artery;  head and neck CTA 10/24/11: stable pattern of fibromuscular dysplasia of ICA and VA with distal changes suggesting prior pseudoaneurysm and dissection; L dist ICA 60%; normal MRI 10/29/11;  followed at [facility] with serial CT scans  . GERD (gastroesophageal reflux disease)   . History of cardiac cath 2007   cath 11/25/05 by Dr. Jenne Campus with normal cors and EF 50%  . Hypertension    a. echo 7/11: mod LVH, EF 55-65%  . Insomnia   . Palpitations    hx  . 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 Prescott Parma  at [facility]    MEDICATIONS:  Prior to Admission medications   Medication Sig Start Date End Date Taking? Authorizing Provider  acetaminophen (TYLENOL) 500 MG tablet Take 1,000 mg by mouth every 6 (six) hours as needed for moderate pain.    [provider]  ALPRAZolam (XANAX) 1 MG tablet TAKE 1 TABLET BY MOUTH THREE TIMES DAILY. 09/23/17   Nelwyn Salisbury, MD  aspirin 81 MG tablet Take 81 mg by mouth daily.      [provider]  cloNIDine (CATAPRES) 0.1 MG tablet Take 0.1 mg by mouth as needed (for high BP; takes along with lisinopril as needed).     [provider]  cyclobenzaprine (FLEXERIL) 10 MG tablet take 1 tablet by mouth three times a day if needed for muscle spasm 07/26/14   Nelwyn Salisbury, MD  hydrochlorothiazide (HYDRODIURIL) 25  MG tablet TAKE 1 TABLET BY MOUTH ONCE DAILY 11/30/13   Rollene RotundaHochrein, James, MD  HYDROcodone-acetaminophen (NORCO) 5-325 MG tablet Take 1 tablet by mouth every 6 (six) hours as needed for moderate pain. 10/26/17   Nelwyn SalisburyFry, Stephen A, MD  lisinopril (PRINIVIL,ZESTRIL) 20 MG tablet Take 20 mg by mouth at bedtime.     [provider]  metoprolol succinate (TOPROL-XL) 25 MG 24 hr tablet Take 25 mg by mouth every morning.     [provider]  omeprazole (PRILOSEC) 40 MG capsule Take 1 capsule (40 mg total) by mouth daily. 02/23/17   Nelwyn SalisburyFry, Stephen A, MD  phentermine 30  MG capsule TAKE ONE CAPSULE BY MOUTH EVERY MORNING. 08/06/17   Nelwyn SalisburyFry, Stephen A, MD  promethazine (PHENERGAN) 25 MG tablet Take 1 tablet (25 mg total) by mouth every 4 (four) hours as needed for nausea or vomiting. 05/11/17   Nelwyn SalisburyFry, Stephen A, MD  zolpidem (AMBIEN) 10 MG tablet TAKE 1 TABLET BY MOUTH EVERY NIGHT AT BEDTIME AS NEEDED 12/24/17   Nelwyn SalisburyFry, Stephen A, MD    ALLERGIES:  Allergies  Allergen Reactions  . Amoxicillin Nausea And Vomiting    Has patient had a PCN reaction causing immediate rash, facial/tongue/throat swelling, SOB or lightheadedness with hypotension: Yes Has patient had a PCN reaction causing severe rash involving mucus membranes or skin necrosis: No Has patient had a PCN reaction that required hospitalization: No Has patient had a PCN reaction occurring within the last 10 years: No If all of the above answers are "NO", then may proceed with Cephalosporin use.   . Sulfonamide Derivatives Hives    SOCIAL HISTORY:  Social History   Tobacco Use  . Smoking status: Never Smoker  . Smokeless tobacco: Never Used  Substance Use Topics  . Alcohol use: No    Alcohol/week: 0.0 oz    FAMILY HISTORY: Family History  Problem Relation Age of Onset  . Coronary artery disease Mother   . Kidney disease Mother   . Heart disease Mother 5070  . Heart attack Father [id number]       CABG  . Kidney failure Father   . Hypertension Father   . Heart disease Father   . Arthritis Unknown        family hx  . Breast cancer Unknown        relative ,50  . Diabetes Unknown        family hx  . Hypertension Unknown        family hx  . Kidney disease Unknown        family hx  . Lung cancer Unknown        family hx    EXAM: BP (!) 140/98   Pulse 89   Temp 97.8 F (36.6 C) (Oral)   Resp 16   LMP 06/14/2011   SpO2 100%  CONSTITUTIONAL: Alert and oriented and responds appropriately to questions. Well-appearing; well-nourished HEAD: Normocephalic EYES: Conjunctivae clear, pupils appear equal,  EOMI ENT: normal nose; moist mucous membranes NECK: Supple, no meningismus, no nuchal rigidity, no LAD  CARD: RRR; S1 and S2 appreciated; no murmurs, no clicks, no rubs, no gallops RESP: Normal chest excursion without splinting or tachypnea; breath sounds clear and equal bilaterally; no wheezes, no rhonchi, no rales, no hypoxia or respiratory distress, speaking full sentences ABD/GI: Normal bowel sounds; non-distended; soft, non-tender, no rebound, no guarding, no peritoneal signs, no hepatosplenomegaly BACK:  The back appears normal and is non-tender to palpation, there is no CVA  tenderness EXT: Normal ROM in all joints; non-tender to palpation; no edema; normal capillary refill; no cyanosis, no calf tenderness or swelling    SKIN: Normal color for age and race; warm; no rash NEURO: Moves all extremities equally PSYCH: The patient's mood and manner are appropriate. Grooming and personal hygiene are appropriate.  MEDICAL DECISION MAKING: Patient here with complaints of chest pain.  Has history of hypertension, carotid dissection.  Wearing an event monitor.  Has been having runs of nonsustained ventricular tachycardia.  EKG here shows no ischemia but does show occasional PVCs.  Potassium is 3.6.  Will check magnesium level.  Will give oral potassium replacement to get her to 4.0.  Chest x-ray is clear.  First troponin negative.  Plan is to repeat second troponin.  Will give aspirin, nitroglycerin given she is still having chest tightness.  Will discuss with cardiology.  Patient has not had a recent echocardiogram.  Last echocardiogram was in 2012.   ED PROGRESS: 3:50 AM  D/w Dr. Clarnce Flock with cardiology who will see patient in the emergency department.   4:25 AM  Cards has seen patient and recommend medicine admission for serial troponins, echocardiogram.  Recommended keeping patient n.p.o. at this time.  Will discuss with medicine for admission.  Patient sees Dr. Clent Ridges with [facility].  Second troponin  negative.  Magnesium normal.  4:47 AM Discussed patient's case with hospitalist, Dr. Toniann Fail.  I have recommended admission and patient (and family if present) agree with this plan. Admitting physician will place admission orders.   I reviewed all nursing notes, vitals, pertinent previous records, EKGs, lab and urine results, imaging (as available).    EKG Interpretation  Date/Time:  Tuesday January 11 2018 20:43:06 EDT Ventricular Rate:  92 PR Interval:  122 QRS Duration: 86 QT Interval:  376 QTC Calculation: 464 R Axis:   0 Text Interpretation:  Sinus rhythm with occasional Premature ventricular complexes Otherwise normal ECG No significant change since last tracing Confirmed by Ward, Baxter Hire [phone]) on 01/12/2018 3:28:27 AM         Ward, Layla Maw, DO 01/12/18 6045

## 2018-01-12 NOTE — H&P (View-Only) (Signed)
Interval coverage note:  Seen the patient with Dr. Anne FuSkains, normal cath in 2007, negative coronary CT in 04/2012. Came in with 14 beats of VT. Has been having presyncope spells and chest pain since Oct or Nov 2018.   Physical exam:  Heart: RRR, no murmur Neck: left sided carotid bruit noted.  Extremity: normal skin color, no edema General: A&O x 3 Abdomen: soft, nontender Lung: Clear to auscultation  Plan: Discussed various options including coronary CT vs cardiac cath, in favor for cardiac catheterization at this time   Risk and benefit of procedure explained to the patient who display clear understanding and agree to proceed.  Discussed with patient possible procedural risk include bleeding, vascular injury, renal injury, arrythmia, MI, stroke and loss of limb or life.    Ramond DialSigned, Hao Meng PA Pager: [phone]  Personally seen and examined. Agree with above.  Given her nonsustained ventricular tachycardia on event monitor, I think it makes sense for us to get a definitive answer with coronary angiography.  Risks and benefits described as above.  She is amenable to further testing.  Donato SchultzMark Shakari Qazi, MD

## 2018-01-12 NOTE — ED Notes (Signed)
Patient is NPO, No Diet was ordered for Lunch.

## 2018-01-12 NOTE — H&P (Signed)
History and Physical    Caroline Boone WJX:[id number] DOB: [birth year] DOA: 01/11/2018  PCP: Nelwyn Salisbury, MD  Patient coming from: Home.  Chief Complaint: Palpitations and chest pain.  HPI: Caroline Boone is a 52 y.o. female with history of hypertension, fibromuscular dysplasia with carotid dissection being followed at [facility] presently being treated conservatively presents to the ER after being noticed to have nonsustained ventricular tachycardia in the event monitor.  Patient has been having palpitations off and on for last 3-4 months and was placed on event monitor recently by cardiologist.  History of the event monitor recorded 2 episodes of NSVT.  Patient was advised to come to the ER.  Since yesterday morning patient has been having chest pressure off and on radiation to her back lasting for a few minutes each time.  During the palpitation episodes patient also gets flushed and diaphoretic.  ED Course: In the ER patient's EKG shows normal sinus rhythm with QTC of 464 ms.  Potassium and magnesium levels were normal.  Troponin was negative.  Cardiology was consulted and patient is being admitted for further management.  For chest pain patient was given sublingual nitroglycerin.  Patient also had developed some mild headache in the frontal area.  No focal deficits or any visual changes.  Tylenol ordered.  Review of Systems: As per HPI, rest all negative.   Past Medical History:  Diagnosis Date  . Anxiety   . Carotid artery injury   . Carotid stenosis    Dopplers 6/12:0-39% bilateral; distal ICAs with marked tortuosity; CT angiogram recommended;  followed at [facility]; surgery to correct LICA stenosis and pseudoaneurysm too risky - > medical Rx;  dopplers 8/13: B/L 0-39% (stable)  . Cerebral aneurysm   . Chest pain    GXT echo 8/11: normal LVF, no ischemia;  h/o neg. myoview in 2005  . Fibromuscular dysplasia (HCC)    affects left vertebral,  artery, left ICA, left renal artery;  head  and neck CTA 10/24/11: stable pattern of fibromuscular dysplasia of ICA and VA with distal changes suggesting prior pseudoaneurysm and dissection; L dist ICA 60%; normal MRI 10/29/11;  followed at [facility] with serial CT scans  . GERD (gastroesophageal reflux disease)   . History of cardiac cath 2007   cath 11/25/05 by Dr. Jenne Campus with normal cors and EF 50%  . Hypertension    a. echo 7/11: mod LVH, EF 55-65%  . Insomnia   . Palpitations    hx  . 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 Prescott Parma  at [facility]    Past Surgical History:  Procedure Laterality Date  . BREAST ENHANCEMENT SURGERY  1991   Bilateral breast   . COLONOSCOPY  04/27/2014   per Dr. Loreta Ave, tubular adenomas, repeat in 10 yrs      reports that she has never smoked. She has never used smokeless tobacco. She reports that she does not drink alcohol or use drugs.  Allergies  Allergen Reactions  . Amoxicillin Nausea And Vomiting    Has patient had a PCN reaction causing immediate rash, facial/tongue/throat swelling, SOB or lightheadedness with hypotension: Yes Has patient had a PCN reaction causing severe rash involving mucus membranes or skin necrosis: No Has patient had a PCN reaction that required hospitalization: No Has patient had a PCN reaction occurring within the last 10 years: No If all of the above answers are "NO", then may proceed with Cephalosporin  use.   . Sulfonamide Derivatives Hives    Family History  Problem Relation Age of Onset  . Coronary artery disease Mother   . Kidney disease Mother   . Heart disease Mother 56  . Heart attack Father 78       CABG  . Kidney failure Father   . Hypertension Father   . Heart disease Father   . Arthritis Unknown        family hx  . Breast cancer Unknown        relative ,50  . Diabetes Unknown        family hx  . Hypertension Unknown        family hx  . Kidney disease Unknown        family hx  . Lung  cancer Unknown        family hx    Prior to Admission medications   Medication Sig Start Date End Date Taking? Authorizing Provider  acetaminophen (TYLENOL) 500 MG tablet Take 1,000 mg by mouth every 6 (six) hours as needed for moderate pain.   Yes [provider]  ALPRAZolam (XANAX) 1 MG tablet TAKE 1 TABLET BY MOUTH THREE TIMES DAILY. Patient taking differently: TAKE 1 TABLET BY MOUTH THREE TIMES DAILY AS NEEDED 09/23/17  Yes Nelwyn Salisbury, MD  aspirin 81 MG tablet Take 81 mg by mouth daily.     Yes [provider]  cloNIDine (CATAPRES) 0.1 MG tablet Take 0.1 mg by mouth as needed (for high BP; takes along with lisinopril as needed).    Yes [provider]  cyclobenzaprine (FLEXERIL) 10 MG tablet take 1 tablet by mouth three times a day if needed for muscle spasm 07/26/14  Yes Nelwyn Salisbury, MD  hydrochlorothiazide (HYDRODIURIL) 25 MG tablet TAKE 1 TABLET BY MOUTH ONCE DAILY 11/30/13  Yes Rollene Rotunda, MD  HYDROcodone-acetaminophen (NORCO) 5-325 MG tablet Take 1 tablet by mouth every 6 (six) hours as needed for moderate pain. 10/26/17  Yes Nelwyn Salisbury, MD  lisinopril (PRINIVIL,ZESTRIL) 20 MG tablet Take 20 mg by mouth at bedtime.    Yes [provider]  metoprolol succinate (TOPROL-XL) 25 MG 24 hr tablet Take 25 mg by mouth every morning.    Yes [provider]  omeprazole (PRILOSEC) 40 MG capsule Take 1 capsule (40 mg total) by mouth daily. 02/23/17  Yes Nelwyn Salisbury, MD  phentermine 30 MG capsule TAKE ONE CAPSULE BY MOUTH EVERY MORNING. 08/06/17  Yes Nelwyn Salisbury, MD  promethazine (PHENERGAN) 25 MG tablet Take 1 tablet (25 mg total) by mouth every 4 (four) hours as needed for nausea or vomiting. 05/11/17  Yes Nelwyn Salisbury, MD  zolpidem (AMBIEN) 10 MG tablet TAKE 1 TABLET BY MOUTH EVERY NIGHT AT BEDTIME AS NEEDED 12/24/17  Yes Nelwyn Salisbury, MD    Physical Exam: Vitals:   01/12/18 0445 01/12/18 0500 01/12/18 0527 01/12/18 0530  BP: 122/65  123/74 (!) 118/56 134/84  Pulse: 96 90 96 94  Resp: 14 11 15 17   Temp:      TempSrc:      SpO2: 100% 100% 100% 100%      Constitutional: Moderately built and nourished. Vitals:   01/12/18 0445 01/12/18 0500 01/12/18 0527 01/12/18 0530  BP: 122/65 123/74 (!) 118/56 134/84  Pulse: 96 90 96 94  Resp: 14 11 15 17   Temp:      TempSrc:      SpO2: 100% 100% 100% 100%  Eyes: Anicteric no pallor. ENMT: No discharge from the ears eyes nose or mouth. Neck: No mass palpated no neck rigidity.  No JVD appreciated. Respiratory: No rhonchi or crepitations. Cardiovascular: S1-S2 heard no murmurs appreciated. Abdomen: Soft nontender bowel sounds present. Musculoskeletal: No edema.  No joint effusion. Skin: No rash.  Skin appears warm. Neurologic: Alert awake oriented to time place and person.  Moves all extremities 5 x 5.  No facial asymmetry.  Tongue is midline. Psychiatric: Appears normal.  Normal affect.   Labs on Admission: I have personally reviewed following labs and imaging studies  CBC: Recent Labs  Lab 01/11/18 2056  WBC 6.9  HGB 12.4  HCT 37.8  MCV 92.6  PLT 345   Basic Metabolic Panel: Recent Labs  Lab 01/11/18 2056 01/12/18 0335  NA 135  --   K 3.6  --   CL 98*  --   CO2 26  --   GLUCOSE 97  --   BUN 7  --   CREATININE 0.56  --   CALCIUM 9.4  --   MG  --  2.0   GFR: CrCl cannot be calculated (Unknown ideal weight.). Liver Function Tests: No results for input(s): AST, ALT, ALKPHOS, BILITOT, PROT, ALBUMIN in the last 168 hours. No results for input(s): LIPASE, AMYLASE in the last 168 hours. No results for input(s): AMMONIA in the last 168 hours. Coagulation Profile: No results for input(s): INR, PROTIME in the last 168 hours. Cardiac Enzymes: No results for input(s): CKTOTAL, CKMB, CKMBINDEX, TROPONINI in the last 168 hours. BNP (last 3 results) No results for input(s): PROBNP in the last 8760 hours. HbA1C: No results for input(s): HGBA1C in the last  72 hours. CBG: No results for input(s): GLUCAP in the last 168 hours. Lipid Profile: No results for input(s): CHOL, HDL, LDLCALC, TRIG, CHOLHDL, LDLDIRECT in the last 72 hours. Thyroid Function Tests: No results for input(s): TSH, T4TOTAL, FREET4, T3FREE, THYROIDAB in the last 72 hours. Anemia Panel: No results for input(s): VITAMINB12, FOLATE, FERRITIN, TIBC, IRON, RETICCTPCT in the last 72 hours. Urine analysis:    Component Value Date/Time   COLORURINE YELLOW 07/23/2014 1618   APPEARANCEUR CLOUDY (A) 07/23/2014 1618   LABSPEC 1.019 07/23/2014 1618   PHURINE 5.5 07/23/2014 1618   GLUCOSEU NEGATIVE 07/23/2014 1618   HGBUR NEGATIVE 07/23/2014 1618   BILIRUBINUR n 05/06/2017 1600   KETONESUR NEGATIVE 07/23/2014 1618   PROTEINUR n 05/06/2017 1600   PROTEINUR NEGATIVE 07/23/2014 1618   UROBILINOGEN 0.2 05/06/2017 1600   UROBILINOGEN 1.0 07/23/2014 1618   NITRITE n 05/06/2017 1600   NITRITE NEGATIVE 07/23/2014 1618   LEUKOCYTESUR Negative 05/06/2017 1600   Sepsis Labs: @LABRCNTIP (procalcitonin:4,lacticidven:4) )No results found for this or any previous visit (from the past 240 hour(s)).   Radiological Exams on Admission: Dg Chest 2 View  Result Date: 01/11/2018 CLINICAL DATA:  Chest pain EXAM: CHEST - 2 VIEW COMPARISON:  09/22/2017 FINDINGS: Minimal scarring at the left base. No acute consolidation or effusion. Normal cardiomediastinal silhouette. No pneumothorax. IMPRESSION: No active cardiopulmonary disease. Electronically Signed   By: Jasmine PangKim  Fujinaga M.D.   On: 01/11/2018 21:52    EKG: Independently reviewed.  Normal sinus rhythm with PVCs.  QTC of 464 ms.  Assessment/Plan Principal Problem:   CHEST PAIN Active Problems:   Essential hypertension   Fibromuscular dysplasia (HCC)   NSVT (nonsustained ventricular tachycardia) (HCC)   Chest pain    1. Chest pain with NSVT -appreciate cardiology consult.  Patient on aspirin metoprolol and  as needed nitroglycerin.  In  anticipation of possible that patient may go for cardiac cath patient is kept n.p.o. except medications.  Check 2D echo.  Check TSH. 2. Hypertension on metoprolol lisinopril and hydrochlorothiazide.  Holding hydrochlorothiazide for now. 3. History of fibromuscular dysplasia with carotid dissection being followed by [facility] being managed conservatively.  Patient on aspirin.   DVT prophylaxis: Lovenox. Code Status: Full code. Family Communication: Discussed with patient. Disposition Plan: Home. Consults called: Cardiology. Admission status: Observation.   Eduard Clos MD [facility] Pager [phone].  If 7PM-7AM, please contact night-coverage [email or web address] Password [email or web address]  01/12/2018, 5:54 AM

## 2018-01-12 NOTE — Progress Notes (Signed)
Patient seen examined and evaluated Essentially 52 year old previously healthy Boone with pipeline [facility] to fibromuscular dysplasia as well as compared to dissection, prior brain aneurysm followed by Dr.Rasheed Janjua-of neurology was referred recently back Dr. Clent RidgesFry by neurologist  Dr. Antoine PocheHochrein and had an event monitor placed 3/11 Admitted to the hospital after being called with 5 beat run of NSVT PVCs 14 beats and rate reached to 17 with underlying sinus rhythm Patient states that she has been taking care of her daughter who has the flu and had a lot of stress and had some chest tightness across the front of her chest without radiation so far point-of-care troponin is less than 0.03 Magnesium is 2 other labs are normal and TSH is 1.2--EKG shows sinus rhythm, PR interval 0.08 QRS axis 0-X degrees no ST-T wave changes  Defer further management whether cardiac catheterization versus CT chest to cardiology from my perspective she is nonfocal and given has had recent clear workup from her neurologist do not feel any specific high yield and further workup of what seems like cardiogenic syncope  I have discussed with Dr. Anne FuSkains who will evaluate the patient later on today and if necessary we can follow but request that cardiology kindly assume primary management if procedures will be performed and I have explained this to the patient clearly that cardiology will determine best course of action-because she is n.p.o. and is having headaches have ordered some Tylenol for now    Caroline KochJai Vandell Kun, MD [facility] (P) [phone]

## 2018-01-12 NOTE — Progress Notes (Signed)
Going to cath lab

## 2018-01-12 NOTE — Progress Notes (Addendum)
Interval coverage note:  Seen the patient with Dr. Skains, normal cath in 2007, negative coronary CT in 04/2012. Came in with 14 beats of VT. Has been having presyncope spells and chest pain since Oct or Nov 2018.   Physical exam:  Heart: RRR, no murmur Neck: left sided carotid bruit noted.  Extremity: normal skin color, no edema General: A&O x 3 Abdomen: soft, nontender Lung: Clear to auscultation  Plan: Discussed various options including coronary CT vs cardiac cath, in favor for cardiac catheterization at this time   Risk and benefit of procedure explained to the patient who display clear understanding and agree to proceed.  Discussed with patient possible procedural risk include bleeding, vascular injury, renal injury, arrythmia, MI, stroke and loss of limb or life.    Signed, Hao Meng PA Pager: [phone]  Personally seen and examined. Agree with above.  Given her nonsustained ventricular tachycardia on event monitor, I think it makes sense for us to get a definitive answer with coronary angiography.  Risks and benefits described as above.  She is amenable to further testing.  Mark Skains, MD  

## 2018-01-12 NOTE — ED Notes (Signed)
Admitting Provider at bedside. 

## 2018-01-12 NOTE — Interval H&P Note (Signed)
History and Physical Interval Note:  01/12/2018 4:20 PM  Caroline Boone  has presented today for surgery, with the diagnosis of vt  The various methods of treatment have been discussed with the patient and family. After consideration of risks, benefits and other options for treatment, the patient has consented to  Procedure(s): LEFT HEART CATH AND CORONARY ANGIOGRAPHY (N/A) as a surgical intervention .  The patient's history has been reviewed, patient examined, no change in status, stable for surgery.  I have reviewed the patient's chart and labs.  Questions were answered to the patient's satisfaction.   Cath Lab Visit (complete for each Cath Lab visit)  Clinical Evaluation Leading to the Procedure:   ACS: No.  Non-ACS:    Anginal Classification: CCS II  Anti-ischemic medical therapy: Minimal Therapy (1 class of medications)  Non-Invasive Test Results: No non-invasive testing performed  Prior CABG: No previous CABG        Theron AristaPeter [facility] LLCJordanMD,FACC 01/12/2018 4:20 PM

## 2018-01-12 NOTE — Telephone Encounter (Signed)
Message from [employer]. Patient had a 14 beat run of VT. She is currently admitted at [facility].

## 2018-01-12 NOTE — ED Notes (Addendum)
Cards at bedside

## 2018-01-12 NOTE — Consult Note (Signed)
Cardiology Consult    Patient ID: Caroline Boone MRN: [id number], DOB/AGE: May 06, 1966   Admit date: 01/11/2018 Date of Consult: 01/12/2018  Primary Physician: Nelwyn SalisburyFry, Stephen A, MD Primary Cardiologist: No primary care provider on file. Requesting Provider: Baxter HireKristen Ward  Patient Profile    Caroline PolioBillie Jo Campas is a 52 y.o. female with a history of palpitation, who is being seen today for the evaluation of VT  Past Medical History   Past Medical History:  Diagnosis Date  . Anxiety   . Carotid artery injury   . Carotid stenosis    Dopplers 6/12:0-39% bilateral; distal ICAs with marked tortuosity; CT angiogram recommended;  [facility]; surgery to correct LICA stenosis and pseudoaneurysm too risky - > medical Rx;  dopplers 8/13: B/L 0-39% (stable)  . Cerebral aneurysm   . Chest pain    GXT echo 8/11: normal LVF, no ischemia;  h/o neg. myoview in 2005  . Fibromuscular dysplasia (HCC)    affects left vertebral,  artery, left ICA, left renal artery;  head and neck CTA 10/24/11: stable pattern of fibromuscular dysplasia of ICA and VA with distal changes suggesting prior pseudoaneurysm and dissection; L dist ICA 60%; normal MRI 10/29/11;  [facility] with serial CT scans  . GERD (gastroesophageal reflux disease)   . History of cardiac cath 2007   cath 11/25/05 by Dr. Jenne CampusMcQueen with normal cors and EF 50%  . Hypertension    a. echo 7/11: mod LVH, EF 55-65%  . Insomnia   . Palpitations    hx  . 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 Prescott [facility]ashid [facility]jua  [facility]    Past Surgical History:  Procedure Laterality Date  . BREAST ENHANCEMENT SURGERY  1991   Bilateral breast   . COLONOSCOPY  04/27/2014   per Dr. Loreta AveMann, tubular adenomas, repeat in 10 yrs      Allergies  Allergies  Allergen Reactions  . Amoxicillin Nausea And Vomiting    Has patient had a PCN reaction causing immediate rash, facial/tongue/throat swelling, SOB or  lightheadedness with hypotension: Yes Has patient had a PCN reaction causing severe rash involving mucus membranes or skin necrosis: No Has patient had a PCN reaction that required hospitalization: No Has patient had a PCN reaction occurring within the last 10 years: No If all of the above answers are "NO", then may proceed with Cephalosporin use.   . Sulfonamide Derivatives Hives    History of Present Illness    Mrs. Larina BrasStone is a 52 year old woman with a past medical history of fibromuscular dysplasia, carotid dissection, cranial aneurysm, hypertension, headaches.  She presents for witnessed episodes of VT on her Ezio patch.  His eye patch was placed recently for symptoms of palpitations and dizziness for the preceding 3 months.  She presented to the emergency room in December with a similar symptoms from work where she had presyncopal symptoms and sweatiness.  Symptoms last for seconds at a time.  She had an asymptomatic workup at that time point.  She saw Dr. Antoine PocheHochrein in February 28 in office and was placed with alive core device.  Per phone note, office received notification earlier today of a 5 beat run of NSVT and PVCs. Cardiac CTA was ordered to r/o ischemia. Not yet done  [employer] called back this PM for another run of NSVT meeting their protocol, 14 beats total VT, rate reached 217 - underlying rhythm sinus.   She's been caring  for her daughter who has the flu. She has been under a lot of stress today and has noticed a few episodes of intermittent chest pain feeling like indigestion earlier.  She was advised to come to the emergency room to be evaluated.    Inpatient Medications    . aspirin  324 mg Oral Once    Family History    Family History  Problem Relation Age of Onset  . Coronary artery disease Mother   . Kidney disease Mother   . Heart disease Mother 75  . Heart attack Father 50       CABG  . Kidney failure Father   . Hypertension Father   . Heart disease  Father   . Arthritis Unknown        family hx  . Breast cancer Unknown        relative ,50  . Diabetes Unknown        family hx  . Hypertension Unknown        family hx  . Kidney disease Unknown        family hx  . Lung cancer Unknown        family hx   indicated that her mother is deceased. She indicated that her father is deceased. She indicated that her maternal grandmother is deceased. She indicated that her maternal grandfather is deceased. She indicated that her paternal grandmother is deceased. She indicated that her paternal grandfather is deceased.   Social History    Social History   Socioeconomic History  . Marital status: Divorced    Spouse name: Not on file  . Number of children: Not on file  . Years of education: Not on file  . Highest education level: Not on file  Occupational History  . Not on file  Social Needs  . Financial resource strain: Not on file  . Food insecurity:    Worry: Not on file    Inability: Not on file  . Transportation needs:    Medical: Not on file    Non-medical: Not on file  Tobacco Use  . Smoking status: Never Smoker  . Smokeless tobacco: Never Used  Substance and Sexual Activity  . Alcohol use: No    Alcohol/week: 0.0 oz  . Drug use: No  . Sexual activity: Yes    Birth control/protection: None  Lifestyle  . Physical activity:    Days per week: Not on file    Minutes per session: Not on file  . Stress: Not on file  Relationships  . Social connections:    Talks on phone: Not on file    Gets together: Not on file    Attends religious service: Not on file    Active member of club or organization: Not on file    Attends meetings of clubs or organizations: Not on file    Relationship status: Not on file  . Intimate partner violence:    Fear of current or ex partner: Not on file    Emotionally abused: Not on file    Physically abused: Not on file    Forced sexual activity: Not on file  Other Topics Concern  . Not on  file  Social History Narrative  . Not on file     Review of Systems    General:  No chills, fever, night sweats or weight changes.  Cardiovascular:  No chest pain, dyspnea on exertion, edema, orthopnea, palpitations, paroxysmal nocturnal dyspnea. Dermatological: No rash, lesions/masses Respiratory: No  cough, dyspnea Urologic: No hematuria, dysuria Abdominal:   No nausea, vomiting, diarrhea, bright red blood per rectum, melena, or hematemesis Neurologic:  No visual changes, wkns, changes in mental status. All other systems reviewed and are otherwise negative except as noted above.  Physical Exam    Blood pressure (!) 140/98, pulse 89, temperature 97.8 F (36.6 C), temperature source Oral, resp. rate 16, last menstrual period 06/14/2011, SpO2 100 %.  General: Pleasant, NAD Psych: Normal affect. Neuro: Alert and oriented X 3. Moves all extremities spontaneously. HEENT: Normal  Neck: Supple without bruits or JVD. Lungs:  Resp regular and unlabored, CTA. Heart: RRR no s3, s4, or murmurs. Abdomen: Soft, non-tender, non-distended, BS + x 4.  Extremities: No clubbing, cyanosis or edema. DP/PT/Radials 2+ and equal bilaterally.  Labs    Troponin [facility] of [facility] Test) Recent Labs    01/12/18 0341  TROPIPOC 0.00   No results for input(s): CKTOTAL, CKMB, TROPONINI in the last 72 hours. Lab Results  Component Value Date   WBC 6.9 01/11/2018   HGB 12.4 01/11/2018   HCT 37.8 01/11/2018   MCV 92.6 01/11/2018   PLT 345 01/11/2018    Recent Labs  Lab 01/11/18 2056  NA 135  K 3.6  CL 98*  CO2 26  BUN 7  CREATININE 0.56  CALCIUM 9.4  GLUCOSE 97   Lab Results  Component Value Date   CHOL 205 (H) 05/06/2017   HDL 80.90 05/06/2017   LDLCALC 114 (H) 05/06/2017   TRIG 49.0 05/06/2017   Lab Results  Component Value Date   DDIMER 1.28 (H) 09/22/2017     Radiology Studies    Dg Chest 2 View  Result Date: 01/11/2018 CLINICAL DATA:  Chest pain EXAM: CHEST - 2 VIEW  COMPARISON:  09/22/2017 FINDINGS: Minimal scarring at the left base. No acute consolidation or effusion. Normal cardiomediastinal silhouette. No pneumothorax. IMPRESSION: No active cardiopulmonary disease. Electronically Signed   By: Jasmine Pang M.D.   On: 01/11/2018 21:52    ECG & Cardiac Imaging    Normal sinus rhythm, no ischemic changes, occasional PVCs  Assessment & Plan    NSVT: Prior echo in 2012 with preserved ejection fraction no other structural changes, no symptoms consistent with heart failure.  Potential features putting her at risk for ischemia is her FMD.  She is quite symptomatic with her PVCs and her NSVT, also notable that on SVT is very fast with rates greater than 210.  She has not had a syncopal episode.    Plan: -Recommend admission. -Please obtain full echo -Keep n.p.o. for potential left heart catheterization  Signed, Macario Golds, MD 01/12/2018, 4:26 AM  For questions or updates, please contact   Please consult [email or web address] for contact info under Cardiology/STEMI.

## 2018-01-13 ENCOUNTER — Other Ambulatory Visit: Payer: Self-pay

## 2018-01-13 ENCOUNTER — Encounter (HOSPITAL_COMMUNITY): Payer: Self-pay | Admitting: Cardiology

## 2018-01-13 ENCOUNTER — Observation Stay (HOSPITAL_BASED_OUTPATIENT_CLINIC_OR_DEPARTMENT_OTHER): Payer: 59

## 2018-01-13 DIAGNOSIS — R072 Precordial pain: Secondary | ICD-10-CM

## 2018-01-13 DIAGNOSIS — I472 Ventricular tachycardia: Secondary | ICD-10-CM | POA: Diagnosis not present

## 2018-01-13 LAB — HIV ANTIBODY (ROUTINE TESTING W REFLEX): HIV Screen 4th Generation wRfx: NONREACTIVE

## 2018-01-13 LAB — ECHOCARDIOGRAM COMPLETE
HEIGHTINCHES: 62 in
Weight: 2400 oz

## 2018-01-13 MED ORDER — METOPROLOL SUCCINATE ER 50 MG PO TB24: 50.0000 mg | ORAL_TABLET | Freq: Every morning | ORAL | 3 refills | Status: DC

## 2018-01-13 MED ORDER — METOPROLOL SUCCINATE ER 50 MG PO TB24
50.0000 mg | ORAL_TABLET | Freq: Every morning | ORAL | Status: DC
  Administered 2018-01-13 – 2018-01-14 (×2): 50 mg via ORAL
  Filled 2018-01-13 (×2): qty 1

## 2018-01-13 MED FILL — Heparin Sodium (Porcine) 2 Unit/ML in Sodium Chloride 0.9%: INTRAMUSCULAR | Qty: 1000 | Status: AC

## 2018-01-13 MED FILL — Lidocaine HCl Local Inj 1%: INTRAMUSCULAR | Qty: 20 | Status: AC

## 2018-01-13 NOTE — Progress Notes (Signed)
Called to bedside as patient became very dizzy and fell in the bathroom while trying to get up. Orthostatic vital sign negative, although per report, patient did have dizziness with standing.   Nurse ambulated the patient in the hallway and she remain very wobbly. Telemetry reviewed, no arrhythmia to explain her symptom. Will cancel the discharge and keep her one more day. Ambulate with assistance whenever she can  Ramond DialSigned, Mariabella Nilsen PA Pager: [phone]

## 2018-01-13 NOTE — Progress Notes (Signed)
*  PRELIMINARY RESULTS* Echocardiogram 2D Echocardiogram has been performed.  Stacey DrainWhite, Chauntae Hults J 01/13/2018, 10:56 AM

## 2018-01-13 NOTE — Plan of Care (Signed)
  Problem: Clinical Measurements: Goal: Will remain free from infection Outcome: Progressing Note:  No s/s of infection noted.   Problem: Pain Managment: Goal: General experience of comfort will improve Outcome: Progressing Note:  Denies c/o pain or discomfort.   Problem: Clinical Measurements: Goal: Respiratory complications will improve Note:  No respiratory complications noted. Goal: Cardiovascular complication will be avoided Note:  No s/s of cardiovascular complications noted.

## 2018-01-13 NOTE — Progress Notes (Addendum)
Pt stated she felt very dizzy and fell in the bathroom. However, pt stated she did not fall and hit the ground that she was able to catch her self on the bedside commode and her husband was able to get her back to bed. Pt denied any pain. Orthos obtained that were negative. Pa on call made aware. Pt then ambulated in the halls way and she was still wobbly. Pa made aware. Will cont to monitor pt.

## 2018-01-13 NOTE — Discharge Summary (Addendum)
Discharge Summary    Patient ID: Caroline Boone,  MRN: [id number], DOB/AGE: 54-Sep-1967 54 y.o.  Admit date: 01/11/2018 Discharge date: 01/13/2018  Primary Care Provider: Nelwyn Boone Primary Cardiologist: Caroline Rotunda, MD  Discharge Diagnoses    Principal Problem:   CHEST PAIN Active Problems:   Essential hypertension   Fibromuscular dysplasia (HCC)   NSVT (nonsustained ventricular tachycardia) (HCC)   Chest pain   Allergies Allergies  Allergen Reactions  . Amoxicillin Nausea And Vomiting    Has patient had a PCN reaction causing immediate rash, facial/tongue/throat swelling, SOB or lightheadedness with hypotension: Yes Has patient had a PCN reaction causing severe rash involving mucus membranes or skin necrosis: No Has patient had a PCN reaction that required hospitalization: No Has patient had a PCN reaction occurring within the last 10 years: No If all of the above answers are "NO", then may proceed with Cephalosporin use.   . Sulfonamide Derivatives Hives    Diagnostic Studies/Procedures    Cath 01/12/2018 Conclusion     The left ventricular systolic function is normal.  LV end diastolic pressure is normal.  The left ventricular ejection fraction is 55-65% by visual estimate.   1. Normal coronary anatomy 2. Normal LV function 3. Normal LVEDP  Plan: medical management.    _____________   History of Present Illness     Caroline Boone is a 52 year old woman with a past medical history of fibromuscular dysplasia, carotid dissection, cranial aneurysm, hypertension, headaches.  She presents for witnessed episodes of VT on her Ezio patch.  His eye patch was placed recently for symptoms of palpitations and dizziness for the preceding 3 months.  She presented to the emergency room in December with a similar symptoms from work where she had presyncopal symptoms and sweatiness.  Symptoms last for seconds at a time.  She had an asymptomatic workup at that time  point.  She saw Dr. Antoine Boone in February 28 in office and was placed with alive core device.  Per phone note, office received notification earlier today of a5 beatrun of NSVT and PVCs. Cardiac CTAwas orderedto r/o ischemia. Not yet Boone  [facility] called back this PM for another run of NSVT meeting their protocol,14 beats total VT, rate reached 217 - underlying rhythm sinus.   She's been caring for her daughter who has the flu. She has been under a lot of stress today and has noticed a few episodes of intermittent chest pain feeling like indigestionearlier.  She was advised to come to the emergency room to be evaluated.    Hospital Course     Consultants: Dr. Toniann Boone   Patient was initially admitted to hospitalist service due to 14 beats run of VT, cardiology was consulted.  Serial troponin was negative.  Given the presence of VT and the recurrent chest pain, she eventually underwent cardiac catheterization on 01/12/2018, this showed normal coronaries.  Patient was seen in the morning of 3/54/2019, cardiology service took over the care of the patient from the hospitalist group.  She does have some nausea, this will be treated conservatively.  She is deemed stable for discharge from cardiology perspective after increase metoprolol to 50 mg daily.  She can follow-up in the cardiology office in 2 weeks.   _____________  Discharge Vitals Blood pressure 135/85, pulse 88, temperature 98.4 F (36.9 C), temperature source Oral, resp. rate (!) 22, height 5\' 2"  (1.575 Boone), weight 150 lb (68 kg), last menstrual period 06/14/2011, SpO2 100 %.  Filed  Weights   01/12/18 1450 01/13/18 0645  Weight: 150 lb 2.1 oz (68.1 kg) 150 lb (68 kg)    Labs & Radiologic Studies    CBC Recent Labs    01/11/18 2056 01/12/18 0620  WBC 6.9 6.6  HGB 12.4 13.0  HCT 37.8 38.5  MCV 92.6 92.3  PLT 345 351   Basic Metabolic Panel Recent Labs    40/98/1103/26/19 2056 01/12/18 0335 01/12/18 0620  NA 135  --    --   K 3.6  --   --   CL 98*  --   --   CO2 26  --   --   GLUCOSE 97  --   --   BUN 7  --   --   CREATININE 0.56  --  0.50  CALCIUM 9.4  --   --   MG  --  2.0  --    Liver Function Tests No results for input(s): AST, ALT, ALKPHOS, BILITOT, PROT, ALBUMIN in the last 72 hours. No results for input(s): LIPASE, AMYLASE in the last 72 hours. Cardiac Enzymes Recent Labs    01/12/18 0620 01/12/18 1146 01/12/18 1725  TROPONINI <0.03 <0.03 <0.03   BNP Invalid input(s): POCBNP D-Dimer No results for input(s): DDIMER in the last 72 hours. Hemoglobin A1C No results for input(s): HGBA1C in the last 72 hours. Fasting Lipid Panel No results for input(s): CHOL, HDL, LDLCALC, TRIG, CHOLHDL, LDLDIRECT in the last 72 hours. Thyroid Function Tests Recent Labs    01/12/18 0620  TSH 1.276   _____________  Dg Chest 2 View  Result Date: 01/11/2018 CLINICAL DATA:  Chest pain EXAM: CHEST - 2 VIEW COMPARISON:  09/22/2017 FINDINGS: Minimal scarring at the left base. No acute consolidation or effusion. Normal cardiomediastinal silhouette. No pneumothorax. IMPRESSION: No active cardiopulmonary disease. Electronically Signed   By: Caroline Boone  Fujinaga Boone.D.   On: 01/11/2018 21:52   Disposition   Pt is being discharged home today in good condition.  Follow-up Plans & Appointments    Follow-up Information    Jodelle GrossLawrence, Kathryn M, NP Follow up on 02/01/2018.   Specialties:  Nurse Practitioner, Radiology, Cardiology Why:  10:30AM. Cardiology office visit.  Contact information: [address] [place] [id number] [phone]            Discharge Medications   Allergies as of 01/13/2018      Reactions   Amoxicillin Nausea And Vomiting   Has patient had a PCN reaction causing immediate rash, facial/tongue/throat swelling, SOB or lightheadedness with hypotension: Yes Has patient had a PCN reaction causing severe rash involving mucus membranes or skin necrosis: No Has patient had a  PCN reaction that required hospitalization: No Has patient had a PCN reaction occurring within the last 10 years: No If all of the above answers are "NO", then may proceed with Cephalosporin use.   Sulfonamide Derivatives Hives      Medication List    STOP taking these medications   hydrochlorothiazide 25 MG tablet Commonly known as:  HYDRODIURIL     TAKE these medications   acetaminophen 500 MG tablet Commonly known as:  TYLENOL Take 1,000 mg by mouth every 6 (six) hours as needed for moderate pain.   ALPRAZolam 1 MG tablet Commonly known as:  XANAX TAKE 1 TABLET BY MOUTH THREE TIMES DAILY. What changed:    how much to take  how to take this  when to take this   aspirin 81 MG tablet Take 81 mg by  mouth daily.   cloNIDine 0.1 MG tablet Commonly known as:  CATAPRES Take 0.1 mg by mouth as needed (for high BP; takes along with lisinopril as needed).   cyclobenzaprine 10 MG tablet Commonly known as:  FLEXERIL take 1 tablet by mouth three times a day if needed for muscle spasm   HYDROcodone-acetaminophen 5-325 MG tablet Commonly known as:  NORCO Take 1 tablet by mouth every 6 (six) hours as needed for moderate pain.   lisinopril 20 MG tablet Commonly known as:  PRINIVIL,ZESTRIL Take 20 mg by mouth at bedtime.   metoprolol succinate 50 MG 24 hr tablet Commonly known as:  TOPROL-XL Take 1 tablet (50 mg total) by mouth every morning. What changed:    medication strength  how much to take   omeprazole 40 MG capsule Commonly known as:  PRILOSEC Take 1 capsule (40 mg total) by mouth daily.   phentermine 30 MG capsule TAKE ONE CAPSULE BY MOUTH EVERY MORNING.   promethazine 25 MG tablet Commonly known as:  PHENERGAN Take 1 tablet (25 mg total) by mouth every 4 (four) hours as needed for nausea or vomiting.   zolpidem 10 MG tablet Commonly known as:  AMBIEN TAKE 1 TABLET BY MOUTH EVERY NIGHT AT BEDTIME AS NEEDED         Outstanding Labs/Studies    None  Duration of Discharge Encounter   Greater than 30 minutes including physician time.  SignedAzalee Course NP 01/13/2018, 10:24 AM  Personally seen and examined. Agree with above.    GEN:No acute distress.   Neck:No JVD Cardiac:RRR, no murmurs, rubs, or gallops.  Respiratory:Clear to auscultation bilaterally. RU:EAVW, nontender, non-distended  MS:No edema; No deformity. Cath site normal Neuro:Nonfocal  Psych: Normal affect     Mild nausea. No cp no sob.     NSVT  - 14 beats on event. Will increase Metoprolol from 25 to 50 QD.   - Cath no CAD, no prolonged QT, normal ECG  - ECHO, normal EF.   HTN  - increased toprol. Labile at home.   Ok for [employer] home. Follow up with Dr. Antoine Boone or APP in 2 weeks.   Donato Schultz, MD

## 2018-01-13 NOTE — Progress Notes (Signed)
Progress Note  Patient Name: Caroline Boone Date of Encounter: 01/13/2018  Primary Cardiologist: No primary care provider on file.   Subjective   Mild nausea. No cp no sob.  Inpatient Medications    Scheduled Meds: . aspirin EC  325 mg Oral Daily  . lisinopril  20 mg Oral QHS  . metoprolol succinate  25 mg Oral q morning - 10a  . sodium chloride flush  3 mL Intravenous Q12H   Continuous Infusions: . sodium chloride     PRN Meds: sodium chloride, acetaminophen, HYDROcodone-acetaminophen, nitroGLYCERIN, ondansetron (ZOFRAN) IV, sodium chloride flush   Vital Signs    Vitals:   01/12/18 2030 01/12/18 2100 01/13/18 0645 01/13/18 0710  BP: 123/60 (!) 97/56 135/85 135/85  Pulse: 80 80 86 88  Resp: 15 13 20  (!) 22  Temp:   98.4 F (36.9 C)   TempSrc:   Oral   SpO2: 98% 96% 100% 100%  Weight:   150 lb (68 kg)   Height:        Intake/Output Summary (Last 24 hours) at 01/13/2018 0835 Last data filed at 01/13/2018 0407 Gross per 24 hour  Intake 1030.11 ml  Output -  Net 1030.11 ml   Filed Weights   01/12/18 1450 01/13/18 0645  Weight: 150 lb 2.1 oz (68.1 kg) 150 lb (68 kg)    Telemetry    No VT here (14 beats on Event) , rare ectopy- Personally Reviewed  ECG    NSR - Personally Reviewed  Physical Exam   GEN: No acute distress.   Neck: No JVD Cardiac: RRR, no murmurs, rubs, or gallops.  Respiratory: Clear to auscultation bilaterally. GI: Soft, nontender, non-distended  MS: No edema; No deformity. Cath site normal Neuro:  Nonfocal  Psych: Normal affect   Labs    Chemistry Recent Labs  Lab 01/11/18 2056 01/12/18 0620  NA 135  --   K 3.6  --   CL 98*  --   CO2 26  --   GLUCOSE 97  --   BUN 7  --   CREATININE 0.56 0.50  CALCIUM 9.4  --   GFRNONAA >60 >60  GFRAA >60 >60  ANIONGAP 11  --      Hematology Recent Labs  Lab 01/11/18 2056 01/12/18 0620  WBC 6.9 6.6  RBC 4.08 4.17  HGB 12.4 13.0  HCT 37.8 38.5  MCV 92.6 92.3  MCH 30.4 31.2   MCHC 32.8 33.8  RDW 12.7 12.7  PLT 345 351    Cardiac Enzymes Recent Labs  Lab 01/12/18 0620 01/12/18 1146 01/12/18 1725  TROPONINI <0.03 <0.03 <0.03    Recent Labs  Lab 01/11/18 2111 01/12/18 0341  TROPIPOC 0.01 0.00     BNPNo results for input(s): BNP, PROBNP in the last 168 hours.   DDimer No results for input(s): DDIMER in the last 168 hours.   Radiology    Dg Chest 2 View  Result Date: 01/11/2018 CLINICAL DATA:  Chest pain EXAM: CHEST - 2 VIEW COMPARISON:  09/22/2017 FINDINGS: Minimal scarring at the left base. No acute consolidation or effusion. Normal cardiomediastinal silhouette. No pneumothorax. IMPRESSION: No active cardiopulmonary disease. Electronically Signed   By: Jasmine Pang M.D.   On: 01/11/2018 21:52    Cardiac Studies   Cath: normal cors, normal EF  Patient Profile     52 y.o. female with NSVT on event monitor, no CAD, normal EF  Assessment & Plan     NSVT  - 14  beats on event. Will increase Metoprolol from 25 to 50 QD.   - Cath no CAD, no prolonged QT, normal ECG  - checking ECHO.   HTN  - increased toprol. Labile at home.   Ok for [employer] [facility] home. Follow up with Dr. Antoine PocheHochrein or APP in 2 weeks.   For questions or updates, please contact [facility] Please consult [email or web address] for contact info under Cardiology/STEMI.      Signed, Donato SchultzMark Skains, MD  01/13/2018, 8:35 AM

## 2018-01-14 DIAGNOSIS — I472 Ventricular tachycardia: Secondary | ICD-10-CM | POA: Diagnosis not present

## 2018-01-14 NOTE — Discharge Summary (Addendum)
Discharge summary addendum:  Ms. Caroline Boone had orthostatic dizziness and weakness when she ambulated yesterday and fell.  She did not lose consciousness.  She was held overnight.  She was hydrated gently.  On 3/29, she was seen by Dr. Anne Fu and all data were reviewed.  She had no significant injuries from her fall.  Her vital signs were stable. She was ambulating without dizziness, chest pain or shortness of breath.  No further inpatient workup is indicated and she is considered stable for discharge, to follow-up as previously scheduled.  Caroline Demark, PA-C 01/14/2018 10:44 AM Beeper [phone]   Allergies as of 01/14/2018      Reactions   Amoxicillin Nausea And Vomiting   Has patient had a PCN reaction causing immediate rash, facial/tongue/throat swelling, SOB or lightheadedness with hypotension: Yes Has patient had a PCN reaction causing severe rash involving mucus membranes or skin necrosis: No Has patient had a PCN reaction that required hospitalization: No Has patient had a PCN reaction occurring within the last 10 years: No If all of the above answers are "NO", then may proceed with Cephalosporin use.   Sulfonamide Derivatives Hives      Medication List    STOP taking these medications   hydrochlorothiazide 25 MG tablet Commonly known as:  HYDRODIURIL     TAKE these medications   acetaminophen 500 MG tablet Commonly known as:  TYLENOL Take 1,000 mg by mouth every 6 (six) hours as needed for moderate pain.   ALPRAZolam 1 MG tablet Commonly known as:  XANAX TAKE 1 TABLET BY MOUTH THREE TIMES DAILY. What changed:    how much to take  how to take this  when to take this   aspirin 81 MG tablet Take 81 mg by mouth daily.   cloNIDine 0.1 MG tablet Commonly known as:  CATAPRES Take 0.1 mg by mouth as needed (for high BP; takes along with lisinopril as needed).   cyclobenzaprine 10 MG tablet Commonly known as:  FLEXERIL take 1 tablet by mouth three times a  day if needed for muscle spasm   HYDROcodone-acetaminophen 5-325 MG tablet Commonly known as:  NORCO Take 1 tablet by mouth every 6 (six) hours as needed for moderate pain.   lisinopril 20 MG tablet Commonly known as:  PRINIVIL,ZESTRIL Take 20 mg by mouth at bedtime.   metoprolol succinate 50 MG 24 hr tablet Commonly known as:  TOPROL-XL Take 1 tablet (50 mg total) by mouth every morning. What changed:    medication strength  how much to take   omeprazole 40 MG capsule Commonly known as:  PRILOSEC Take 1 capsule (40 mg total) by mouth daily.   phentermine 30 MG capsule TAKE ONE CAPSULE BY MOUTH EVERY MORNING.   promethazine 25 MG tablet Commonly known as:  PHENERGAN Take 1 tablet (25 mg total) by mouth every 4 (four) hours as needed for nausea or vomiting.   zolpidem 10 MG tablet Commonly known as:  AMBIEN TAKE 1 TABLET BY MOUTH EVERY NIGHT AT BEDTIME AS NEEDED      Follow-up Information    Jodelle Gross, NP Follow up on 02/01/2018.   Specialties:  Nurse Practitioner, Radiology, Cardiology Why:  10:30AM. Cardiology office visit.  Contact information: [address] [place] [address] [phone]          Personally seen and examined. Agree with above.  No CAD, normal EF, trace MR on ECHO. Metoprolol increased to 50 Watch for hypotension. If so, may  need to change agents.   Ready for DC.  Caroline SchultzMark Makeda Peeks, MD

## 2018-01-14 NOTE — Progress Notes (Addendum)
Progress Note  Patient Name: Caroline Boone Date of Encounter: 01/14/2018  Primary Cardiologist: Rollene Rotunda, MD   Subjective   Feeling better today, ambulated some last pm after eating, did better. Hit R forearm when she fell, some soreness there, but no deformity or bruising.  Inpatient Medications    Scheduled Meds: . aspirin EC  325 mg Oral Daily  . lisinopril  20 mg Oral QHS  . metoprolol succinate  50 mg Oral q morning - 10a  . sodium chloride flush  3 mL Intravenous Q12H   Continuous Infusions: . sodium chloride     PRN Meds: sodium chloride, acetaminophen, HYDROcodone-acetaminophen, nitroGLYCERIN, ondansetron (ZOFRAN) IV, sodium chloride flush   Vital Signs    Vitals:   01/13/18 2103 01/14/18 0004 01/14/18 0450 01/14/18 0729  BP: 131/72 121/70 111/65 (!) 109/48  Pulse:  66 85 66  Resp:  16 16   Temp:  97.7 F (36.5 C) 98.3 F (36.8 C) 97.8 F (36.6 C)  TempSrc:  Oral Oral Oral  SpO2:  100% 97% 97%  Weight:      Height:        Intake/Output Summary (Last 24 hours) at 01/14/2018 0918 Last data filed at 01/13/2018 1802 Gross per 24 hour  Intake 120 ml  Output -  Net 120 ml   Filed Weights   01/12/18 1450 01/13/18 0645  Weight: 150 lb 2.1 oz (68.1 kg) 150 lb (68 kg)    Telemetry    SR, sinus brady w/ sinus arrhythmia occ high 40s, rare PVCs- Personally Reviewed  ECG    None today - Personally Reviewed  Physical Exam   GEN: No acute distress.   Neck: No JVD Cardiac: RRR, no murmurs, rubs, or gallops.  Respiratory: Clear to auscultation bilaterally. GI: Soft, nontender, non-distended  MS: No edema; No deformity. Cath site normal Neuro:  Nonfocal  Psych: Normal affect   Labs    Chemistry Recent Labs  Lab 01/11/18 2056 01/12/18 0620  NA 135  --   K 3.6  --   CL 98*  --   CO2 26  --   GLUCOSE 97  --   BUN 7  --   CREATININE 0.56 0.50  CALCIUM 9.4  --   GFRNONAA >60 >60  GFRAA >60 >60  ANIONGAP 11  --       Hematology Recent Labs  Lab 01/11/18 2056 01/12/18 0620  WBC 6.9 6.6  RBC 4.08 4.17  HGB 12.4 13.0  HCT 37.8 38.5  MCV 92.6 92.3  MCH 30.4 31.2  MCHC 32.8 33.8  RDW 12.7 12.7  PLT 345 351    Cardiac Enzymes Recent Labs  Lab 01/12/18 0620 01/12/18 1146 01/12/18 1725  TROPONINI <0.03 <0.03 <0.03    Recent Labs  Lab 01/11/18 2111 01/12/18 0341  TROPIPOC 0.01 0.00      Radiology    No results found.  Cardiac Studies   Cath: normal cors, normal EF  ECHO: 01/13/2018 - Left ventricle: The cavity size was normal. Wall thickness was   normal. Systolic function was vigorous. The estimated ejection   fraction was in the range of 65% to 70%. Wall motion was normal;   there were no regional wall motion abnormalities. Doppler   parameters are consistent with abnormal left ventricular   relaxation (grade 1 diastolic dysfunction). The E/e&' ratio is   between 8-15, suggesting indeterminate LV filling pressure. - Mitral valve: Mildly thickened leaflets . There was trivial   regurgitation. -  Left atrium: The atrium was normal in size. - Inferior vena cava: The vessel was normal in size. The   respirophasic diameter changes were in the normal range (>= 50%),   consistent with normal central venous pressure. Impressions: - LVEF 65-70%, normal wall thickness, normal wall motion, grade 1   DD, indeterminate LV filling pressure, trivial MR, normal LA   size, normal IVC.  Patient Profile     52 y.o. female with NSVT on event monitor, no CAD, normal EF  Assessment & Plan     NSVT  - 14 beats on event. Metoprolol increased from 25 to 50 QD.  - got the higher dose yesterday  - Cath no CAD, no prolonged QT, normal ECG  - Echo w/ no sig abnormalities  HTN  - increased toprol. Follow at home  Weakness and Fall - pt had recent illness and cath - feels she did not get her strength back from the flu before coming in to the hospital. - may have been a little dry, her  cath was late in the day - continue oral hydration and increase activity.  Ok for [employer] home. Follow up with Dr. Antoine PocheHochrein or APP in 2 weeks.   For questions or updates, please contact [facility] Please consult [email or web address] for contact info under Cardiology/STEMI.      Signed, Theodore Demarkhonda Barrett, PA-C  01/14/2018, 9:18 AM    Personally seen and examined. Agree with above.  Agree with DC. Hydration yesterday. Feels better after eating. No CAD. Has follow up.  Exam: RRR, CTAB, minor arm bruise. Minor discomfort in arm. Pulse is normal. Neuro intact, moves well.  NSVT  - increased metop  - hydrate  - no CAD  - ECHO normal EF  - Told her if BP too low at home with increased metoprolol, may need to try another medication (Verapamil for instance). Stay hydrated.   OK to get back to work Monday.   Donato SchultzMark Xayvier Vallez, MD

## 2018-01-14 NOTE — Progress Notes (Signed)
Discharge instructions reviewed with pt. Pt has no questions at this time. Will cont to monitor pt. Radial site level 0. IV d/c.

## 2018-01-24 ENCOUNTER — Telehealth: Payer: Self-pay | Admitting: *Deleted

## 2018-01-24 NOTE — Telephone Encounter (Signed)
Incoming fax from [employer] with an EKG reading of a 7 beat run of Vtach. Patient has been called. There was no answer and the voicemail was full.

## 2018-01-25 NOTE — Telephone Encounter (Signed)
[facility] review with [facility]

## 2018-01-26 ENCOUNTER — Other Ambulatory Visit: Payer: Self-pay | Admitting: Family Medicine

## 2018-01-26 NOTE — Telephone Encounter (Signed)
Call in #[phone] with one rf 

## 2018-01-26 NOTE — Telephone Encounter (Signed)
Last OV 10/26/2017   Last refilled 09/23/2017 disp 270 with no refills   Sent to PCP for approval

## 2018-01-28 NOTE — Telephone Encounter (Signed)
Left a message to call back. Message was pertaining to prior [employer] strip.

## 2018-01-31 NOTE — Progress Notes (Signed)
Cardiology Office Note   Date:  02/01/2018   ID:  Caroline Boone, DOB [birth year], MRN [id number]  PCP:  Nelwyn SalisburyFry, Stephen A, MD  Cardiologist: Dr. Antoine PocheHochrein Chief Complaint  Patient presents with  . Transitions Of Care     History of Present Illness: Caroline PolioBillie Jo Boone is a 52 y.o. female who presents for posthospitalization follow-up, after admission for witnessed V. tach, with other history to include carotid dissection, cranial aneurysm, hypertension, chronic headaches, fibromuscular dysplasia.  The patient had complaints of presyncopal symptoms and sweatiness.  The patient underwent cardiac catheterization on 01/12/2018 revealing normal coronary anatomy.  Metoprolol was increased to 50 mg daily.  Prior to discharge the patient became very dizzy and fell in the bathroom to try to get up.  The patient was negative for orthostatic hypotension.  She was given oral hydration, was advised to increase activity slowly.  She continues to wear cardiac monitor and comes today with multiple questions. She is having some fatigue on increased dose of metoprolol. She has had some :breakthrough" tachycardia at rest since discharge,   Past Medical History:  Diagnosis Date  . Anxiety   . Carotid artery injury   . Carotid stenosis    Dopplers 6/12:0-39% bilateral; distal ICAs with marked tortuosity; CT angiogram recommended;  [facility]; surgery to correct LICA stenosis and pseudoaneurysm too risky - > medical Rx;  dopplers 8/13: B/L 0-39% (stable)  . Cerebral aneurysm   . Chest pain    GXT echo 8/11: normal LVF, no ischemia;  h/o neg. myoview in 2005  . Family history of adverse reaction to anesthesia    MOMTHER HAD TROUBLE WAKING  . Fibromuscular dysplasia (HCC)    affects left vertebral,  artery, left ICA, left renal artery;  head and neck CTA 10/24/11: stable pattern of fibromuscular dysplasia of ICA and VA with distal changes suggesting prior pseudoaneurysm and dissection; L dist ICA 60%; normal MRI  10/29/11;  [facility]  . GERD (gastroesophageal reflux disease)   . Headache   . History of cardiac cath 2007   cath 11/25/05 by Dr. Jenne CampusMcQueen with normal cors and EF 50%  . Hypertension    a. echo 7/11: mod LVH, EF 55-65%  . Insomnia   . Palpitations    hx  . 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 Pres[facility]ashid [facility]    Past Surgical History:  Procedure Laterality Date  . BREAST ENHANCEMENT SURGERY  1991   Bilateral breast   . COLONOSCOPY  04/27/2014   per Dr. Loreta AveMann, tubular adenomas, repeat in 10 yrs   . LEFT HEART CATH AND CORONARY ANGIOGRAPHY N/A 01/12/2018   Procedure: LEFT HEART CATH AND CORONARY ANGIOGRAPHY;  Surgeon: SwazilandJordan, Peter M, MD;  [facility];  Service: Cardiovascular;  Laterality: N/A;     Current Outpatient Medications  Medication Sig Dispense Refill  . acetaminophen (TYLENOL) 500 MG tablet Take 1,000 mg by mouth every 6 (six) hours as needed for moderate pain.    Marland Kitchen. ALPRAZolam (XANAX) 1 MG tablet TAKE 1 TABLET BY MOUTH THREE TIMES DAILY 270 tablet 0  . aspirin 81 MG tablet Take 81 mg by mouth daily.      . cloNIDine (CATAPRES) 0.1 MG tablet Take 0.1 mg by mouth as needed (for high BP; takes along with lisinopril as needed).     . cyclobenzaprine (FLEXERIL) 10 MG tablet take 1 tablet  by mouth three times a day if needed for muscle spasm 90 tablet 5  . HYDROcodone-acetaminophen (NORCO) 5-325 MG tablet Take 1 tablet by mouth every 6 (six) hours as needed for moderate pain. 120 tablet 0  . lisinopril (PRINIVIL,ZESTRIL) 20 MG tablet Take 20 mg by mouth at bedtime.     . metoprolol succinate (TOPROL-XL) 50 MG 24 hr tablet Take 1 tablet (50 mg total) by mouth at bedtime. 90 tablet 3  . omeprazole (PRILOSEC) 40 MG capsule Take 1 capsule (40 mg total) by mouth daily. 30 capsule 5  . phentermine 30 MG capsule TAKE ONE CAPSULE BY MOUTH EVERY MORNING. 90 capsule 1  . promethazine  (PHENERGAN) 25 MG tablet Take 1 tablet (25 mg total) by mouth every 4 (four) hours as needed for nausea or vomiting. 60 tablet 2  . zolpidem (AMBIEN) 10 MG tablet TAKE 1 TABLET BY MOUTH EVERY NIGHT AT BEDTIME AS NEEDED 30 tablet 5   No current facility-administered medications for this visit.     Allergies:   Amoxicillin and Sulfonamide derivatives    Social History:  The patient  reports that she has never smoked. She has never used smokeless tobacco. She reports that she does not drink alcohol or use drugs.   Family History:  The patient's family history includes Arthritis in her unknown relative; Breast cancer in her unknown relative; Coronary artery disease in her mother; Diabetes in her unknown relative; Heart attack (age of onset: 42) in her father; Heart disease in her father; Heart disease (age of onset: 52) in her mother; Hypertension in her father and unknown relative; Kidney disease in her mother and unknown relative; Kidney failure in her father; Lung cancer in her unknown relative.    ROS: All other systems are reviewed and negative. Unless otherwise mentioned in H&P    PHYSICAL EXAM: VS:  BP 118/68   Pulse 87   Ht 5\' 2"  (1.575 m)   Wt 149 lb (67.6 kg)   LMP 06/14/2011   BMI 27.25 kg/m  , BMI Body mass index is 27.25 kg/m. GEN: Well nourished, well developed, in no acute distress  HEENT: normal  Neck: no JVD, carotid bruits, or masses Cardiac: RRR; no murmurs, rubs, or gallops,no edema  Respiratory:  clear to auscultation bilaterally, normal work of breathing GI: soft, nontender, nondistended, + BS MS: no deformity or atrophy  Skin: warm and dry, no rash Neuro:  Strength and sensation are intact Psych: euthymic mood, full affect   EKG: Not completed during office visit. Wearing cardiac monitor   Recent Labs: 06/01/2017: ALT 11 01/11/2018: BUN 7; Potassium 3.6; Sodium 135 01/12/2018: Creatinine, Ser 0.50; Hemoglobin 13.0; Magnesium 2.0; Platelets 351; TSH 1.276     Lipid Panel    Component Value Date/Time   CHOL 205 (H) 05/06/2017 0948   TRIG 49.0 05/06/2017 0948   HDL 80.90 05/06/2017 0948   CHOLHDL 3 05/06/2017 0948   VLDL 9.8 05/06/2017 0948   LDLCALC 114 (H) 05/06/2017 0948      Wt Readings from Last 3 Encounters:  02/01/18 149 lb (67.6 kg)  01/13/18 150 lb (68 kg)  12/16/17 155 lb (70.3 kg)      Other studies Reviewed: Cath: normal cors, normal EF  ECHO: 01/13/2018 - Left ventricle: The cavity size was normal. Wall thickness was normal. Systolic function was vigorous. The estimated ejection fraction was in the range of 65% to 70%. Wall motion was normal; there were no regional wall motion abnormalities. Doppler parameters are  consistent with abnormal left ventricular relaxation (grade 1 diastolic dysfunction). The E/e&' ratio is between 8-15, suggesting indeterminate LV filling pressure. - Mitral valve: Mildly thickened leaflets . There was trivial regurgitation. - Left atrium: The atrium was normal in size. - Inferior vena cava: The vessel was normal in size. The respirophasic diameter changes were in the normal range (>= 50%), consistent with normal central venous pressure. Impressions: - LVEF 65-70%, normal wall thickness, normal wall motion, grade 1 DD, indeterminate LV filling pressure, trivial MR, normal LA size, normal IVC.    ASSESSMENT AND PLAN:  1.  Tachybradycardia with NSVT: The patient is now wearing a cardiac monitor.  She is asymptomatic currently fatigue on metoprolol.  She denies any dizziness or orthostatic symptoms.  I have suggested that she begin metoprolol 50 mg XL at nighttime instead of during the day to avoid daytime fatigue.  She states that she has had some tachycardia at rest and has avoided caffeine.  She will continue her cardiac monitor for another week for a full 4 weeks post hospitalization.  She is due to follow-up with Dr. Antoine Poche to discuss her monitor findings  and make decision concerning referral to EP.  2.  Hypertension: Blood pressures currently well controlled.  No medication changes at this time.  We will continue ongoing follow-up with Dr. Antoine Poche in June.  She is to call us for any new symptoms.   Current medicines are reviewed at length with the patient today.    Labs/ tests ordered today include: None Bettey Mare. Liborio Nixon, ANP, AACC   02/01/2018 1:08 PM    [facility] [address], [place], [place] [address] Phone: [phone]; Fax: [phone]

## 2018-02-01 ENCOUNTER — Encounter: Payer: Self-pay | Admitting: Adult Health

## 2018-02-01 ENCOUNTER — Ambulatory Visit: Payer: 59 | Admitting: Adult Health

## 2018-02-01 VITALS — BP 118/68 | HR 87 | Ht 62.0 in | Wt 149.0 lb

## 2018-02-01 DIAGNOSIS — I472 Ventricular tachycardia, unspecified: Secondary | ICD-10-CM

## 2018-02-01 DIAGNOSIS — I10 Essential (primary) hypertension: Secondary | ICD-10-CM

## 2018-02-01 MED ORDER — METOPROLOL SUCCINATE ER 50 MG PO TB24: 50.0000 mg | ORAL_TABLET | Freq: Every day | ORAL | 3 refills | Status: DC

## 2018-02-01 NOTE — Patient Instructions (Signed)
Special Instructions:  OK TO SEND MONITOR BACK TO COMPANY  Follow-Up: Your physician wants you to follow-up in: MAKE SURE TO KEEP SCHEDULED APPT [facility]    Thank you for choosing [facility] at [facility]!!

## 2018-02-12 ENCOUNTER — Other Ambulatory Visit: Payer: Self-pay | Admitting: Family Medicine

## 2018-02-14 NOTE — Telephone Encounter (Signed)
Last filled 08/06/17 for 6 months.  Please advise.

## 2018-02-15 NOTE — Telephone Encounter (Signed)
Call in #[phone] with 5 rf 

## 2018-02-21 NOTE — Progress Notes (Signed)
Cardiology Office Note   Date:  02/23/2018   ID:  Caroline Boone, DOB 1966-05-08, MRN [id number]  PCP:  Nelwyn Salisbury, MD  Cardiologist:   Rollene Rotunda, MD Referring:  Nelwyn Salisbury, MD   Chief Complaint  Patient presents with  . Palpitations      History of Present Illness: Caroline Boone is a 52 y.o. female who is referred by Nelwyn Salisbury, MD for evaluation of palpitations.   In 2013 she had a coronary CT angiogram which demonstrated a calcium score of 0 and no suggestion of coronary disease.  She had been followed at [facility] for her carotid pseudoaneurysm and fibromuscular dysplasia. She apparently has stable anatomy.   She has this followed routinely at [facility]. She was in the ED with chest pain and had a CT that was negative for PE.   The patient underwent cardiac catheterization on 01/12/2018 revealing normal coronary anatomy.  I did review the hospital records and note that she had a 14 beat run of nonsustained ventricular tachycardia in the hospital.  Since that time she has a Apple watch and she has had tachypalpitations.  She reports that he will be recording at 144 and she is not really noticing it.  It does not correlate necessarily with any symptoms and today she could not show me the rhythm strips.  She has not had any syncope.  She has had episodes where her heart goes fast and slow as described previously.  This is why she had presented to the emergency room previously.    Past Medical History:  Diagnosis Date  . Anxiety   . Carotid artery injury   . Carotid stenosis    Dopplers 6/12:0-39% bilateral; distal ICAs with marked tortuosity; CT angiogram recommended;  followed at [facility]; surgery to correct LICA stenosis and pseudoaneurysm too risky - > medical Rx;  dopplers 8/13: B/L 0-39% (stable)  . Cerebral aneurysm   . Chest pain    GXT echo 8/11: normal LVF, no ischemia;  h/o neg. myoview in 2005  . Family history of adverse reaction to anesthesia    MOMTHER HAD TROUBLE  WAKING  . Fibromuscular dysplasia (HCC)    affects left vertebral,  artery, left ICA, left renal artery;  head and neck CTA 10/24/11: stable pattern of fibromuscular dysplasia of ICA and VA with distal changes suggesting prior pseudoaneurysm and dissection; L dist ICA 60%; normal MRI 10/29/11;  followed at [facility] with serial CT scans  . GERD (gastroesophageal reflux disease)   . Headache   . History of cardiac cath 2007   cath 11/25/05 by Dr. Jenne Campus with normal cors and EF 50%  . Hypertension    a. echo 7/11: mod LVH, EF 55-65%  . Insomnia   . Palpitations    hx  . 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 Prescott Parma  at [facility]    Past Surgical History:  Procedure Laterality Date  . BREAST ENHANCEMENT SURGERY  1991   Bilateral breast   . COLONOSCOPY  04/27/2014   per Dr. Loreta Ave, tubular adenomas, repeat in 10 yrs   . LEFT HEART CATH AND CORONARY ANGIOGRAPHY N/A 01/12/2018   Procedure: LEFT HEART CATH AND CORONARY ANGIOGRAPHY;  Surgeon: Swaziland, Peter M, MD;  Location: [facility] INVASIVE CV LAB;  Service: Cardiovascular;  Laterality: N/A;     Current Outpatient Medications  Medication Sig Dispense Refill  . acetaminophen (TYLENOL) 500 MG tablet  Take 1,000 mg by mouth every 6 (six) hours as needed for moderate pain.    Marland Kitchen ALPRAZolam (XANAX) 1 MG tablet TAKE 1 TABLET BY MOUTH THREE TIMES DAILY 270 tablet 0  . aspirin 81 MG tablet Take 81 mg by mouth daily.      . cloNIDine (CATAPRES) 0.1 MG tablet Take 0.1 mg by mouth as needed (for high BP; takes along with lisinopril as needed).     . cyclobenzaprine (FLEXERIL) 10 MG tablet take 1 tablet by mouth three times a day if needed for muscle spasm 90 tablet 5  . HYDROcodone-acetaminophen (NORCO) 5-325 MG tablet Take 1 tablet by mouth every 6 (six) hours as needed for moderate pain. 120 tablet 0  . lisinopril (PRINIVIL,ZESTRIL) 20 MG tablet Take 20 mg by mouth at bedtime.     . metoprolol succinate  (TOPROL-XL) 50 MG 24 hr tablet Take 1 tablet (50 mg total) by mouth at bedtime. 90 tablet 3  . omeprazole (PRILOSEC) 40 MG capsule Take 1 capsule (40 mg total) by mouth daily. 30 capsule 5  . phentermine 30 MG capsule TAKE ONE CAPSULE BY MOUTH EVERY MORNING 30 capsule 5  . promethazine (PHENERGAN) 25 MG tablet Take 1 tablet (25 mg total) by mouth every 4 (four) hours as needed for nausea or vomiting. 60 tablet 2  . zolpidem (AMBIEN) 10 MG tablet TAKE 1 TABLET BY MOUTH EVERY NIGHT AT BEDTIME AS NEEDED 30 tablet 5   No current facility-administered medications for this visit.     Allergies:   Amoxicillin and Sulfonamide derivatives    ROS:  Please see the history of present illness.   Otherwise, review of systems are positive for none.   All other systems are reviewed and negative.    PHYSICAL EXAM: VS:  BP 105/67   Pulse 72   Ht  (1.575 m)   Wt 152 lb (68.9 kg)   LMP 06/14/2011   BMI 27.80 kg/m  , BMI Body mass index is 27.8 kg/m.  GENERAL:  Well appearing NECK:  No jugular venous distention, waveform within normal limits, carotid upstroke brisk and symmetric, no bruits, no thyromegaly LUNGS:  Clear to auscultation bilaterally CHEST:  Unremarkable HEART:  PMI not displaced or sustained,S1 and S2 within normal limits, no S3, no S4, no clicks, no rubs, no murmurs ABD:  Flat, positive bowel sounds normal in frequency in pitch, no bruits, no rebound, no guarding, no midline pulsatile mass, no hepatomegaly, no splenomegaly EXT:  2 plus pulses throughout, no edema, no cyanosis no clubbin    EKG:  EKG not ordered today.    Recent Labs: 06/01/2017: ALT 11 01/11/2018: BUN 7; Potassium 3.6; Sodium 135 01/12/2018: Creatinine, Ser 0.50; Hemoglobin 13.0; Magnesium 2.0; Platelets 351; TSH 1.276    Lipid Panel    Component Value Date/Time   CHOL 205 (H) 05/06/2017 0948   TRIG 49.0 05/06/2017 0948   HDL 80.90 05/06/2017 0948   CHOLHDL 3 05/06/2017 0948   VLDL 9.8 05/06/2017 0948     LDLCALC 114 (H) 05/06/2017 0948      Wt Readings from Last 3 Encounters:  02/22/18 152 lb (68.9 kg)  02/01/18 149 lb (67.6 kg)  01/13/18 150 lb (68 kg)      Other studies Reviewed: Additional studies/ records that were reviewed today include:  Hospital records Review of the above records demonstrates:      ASSESSMENT AND PLAN:  NSVT - She has been fatigued on beta blocker.  However, she thinks  she was doing better when she was during the day.  She was told to take it at night because of fatigue.  We will get to change it back to taking it during the day.  She needs to go home and figure out how to print will record the EKG strips from her new watch.  I am going to get her an appointment with EP to further consider work-up and management of her symptomatic nonsustained ventricular tachycardia.  HYPERTENSION -  The blood pressure is at target.  She will continue meds as listed.   Fibromuscular dysplasia -  This is being followed conservatively.      Current medicines are reviewed at length with the patient today.  The patient does not have concerns regarding medicines.  The following changes have been made:  None  Labs/ tests ordered today include: None  Orders Placed This Encounter  Procedures  . Ambulatory referral to Cardiac Electrophysiology     Disposition:   FU with EP  Signed, Rollene Rotunda, MD  02/23/2018 1:03 PM    [facility]

## 2018-02-22 ENCOUNTER — Ambulatory Visit: Payer: 59 | Admitting: Cardiology

## 2018-02-22 ENCOUNTER — Encounter: Payer: Self-pay | Admitting: Cardiology

## 2018-02-22 VITALS — BP 105/67 | HR 72 | Ht 62.0 in | Wt 152.0 lb

## 2018-02-22 DIAGNOSIS — I472 Ventricular tachycardia: Secondary | ICD-10-CM

## 2018-02-22 DIAGNOSIS — I4729 Other ventricular tachycardia: Secondary | ICD-10-CM

## 2018-02-22 NOTE — Patient Instructions (Signed)
Medication Instructions:  Continue current medications  If you need a refill on your cardiac medications before your next appointment, please call your pharmacy.  Labwork: None Ordered   Testing/Procedures: None Ordered  Follow-Up: You have been referred to Electrophysiologist Dr Elberta Fortis    Thank you for choosing [facility] at [facility]!!

## 2018-02-23 ENCOUNTER — Encounter: Payer: Self-pay | Admitting: Cardiology

## 2018-02-25 ENCOUNTER — Telehealth: Payer: Self-pay | Admitting: Cardiology

## 2018-02-25 NOTE — Telephone Encounter (Signed)
New message    Patient states she was in the hospital and had a CT done, does not thinks she still needs to have the Cardiac Ct done, please advise

## 2018-02-25 NOTE — Telephone Encounter (Signed)
Returned call to patient who reports she was returning a call to our office about scheduling a test. She states she was told by the operator that she needed to schedule a coronary CT which she was not aware of. She states she was told by MD that she needed to see Dr. Elberta Fortis. This appt has been scheduled for 5/30. Explained that coronary CT order was from March and based off event monitor findings and the same day it was ordered, she went to hospital for CP and had a LHC. Explained this test is no longer needed - it has been cancelled. No further assistance needed.

## 2018-03-06 ENCOUNTER — Other Ambulatory Visit: Payer: Self-pay | Admitting: Family Medicine

## 2018-03-16 ENCOUNTER — Encounter: Payer: Self-pay | Admitting: Family Medicine

## 2018-03-16 ENCOUNTER — Ambulatory Visit (INDEPENDENT_AMBULATORY_CARE_PROVIDER_SITE_OTHER): Payer: 59 | Admitting: Family Medicine

## 2018-03-16 VITALS — BP 120/84 | HR 87 | Temp 98.3°F | Ht 62.0 in | Wt 153.9 lb

## 2018-03-16 DIAGNOSIS — M545 Low back pain, unspecified: Secondary | ICD-10-CM

## 2018-03-16 DIAGNOSIS — G8929 Other chronic pain: Secondary | ICD-10-CM

## 2018-03-16 DIAGNOSIS — F1190 Opioid use, unspecified, uncomplicated: Secondary | ICD-10-CM

## 2018-03-16 MED ORDER — HYDROCODONE-ACETAMINOPHEN 5-325 MG PO TABS: 1.0000 | ORAL_TABLET | Freq: Four times a day (QID) | ORAL | 0 refills | Status: DC | PRN

## 2018-03-16 NOTE — Progress Notes (Signed)
   Subjective:    Patient ID: Caroline Boone, female    DOB: 1966/03/13, 52 y.o.   MRN: [id number]  HPI Here for pain management. She is doing well. 2 Indication for chronic opioid: low back pain Medication and dose: Norco 5-325 # pills per month: 120 Last UDS date: 10-26-17 Opioid Treatment Agreement signed (Y/N): 03-16-18 Opioid Treatment Agreement last reviewed with patient:  03-16-18 NCCSRS reviewed this encounter (include red flags):  03-16-18     Review of Systems  Constitutional: Negative.   Respiratory: Negative.   Cardiovascular: Negative.   Musculoskeletal: Positive for back pain.  Neurological: Negative.        Objective:   Physical Exam  Constitutional: She is oriented to person, place, and time. She appears well-developed and well-nourished.  Cardiovascular: Normal rate, regular rhythm, normal heart sounds and intact distal pulses.  Pulmonary/Chest: Effort normal and breath sounds normal.  Neurological: She is alert and oriented to person, place, and time.          Assessment & Plan:  Pain management. Meds were refilled.  Gershon Crane, MD

## 2018-03-17 ENCOUNTER — Ambulatory Visit (INDEPENDENT_AMBULATORY_CARE_PROVIDER_SITE_OTHER): Payer: 59 | Admitting: Cardiology

## 2018-03-17 ENCOUNTER — Encounter: Payer: Self-pay | Admitting: Cardiology

## 2018-03-17 ENCOUNTER — Encounter (INDEPENDENT_AMBULATORY_CARE_PROVIDER_SITE_OTHER): Payer: Self-pay

## 2018-03-17 VITALS — BP 120/90 | HR 91 | Ht 62.0 in | Wt 152.2 lb

## 2018-03-17 DIAGNOSIS — I472 Ventricular tachycardia, unspecified: Secondary | ICD-10-CM

## 2018-03-17 DIAGNOSIS — I10 Essential (primary) hypertension: Secondary | ICD-10-CM | POA: Diagnosis not present

## 2018-03-17 DIAGNOSIS — Z79899 Other long term (current) drug therapy: Secondary | ICD-10-CM | POA: Diagnosis not present

## 2018-03-17 MED ORDER — FLECAINIDE ACETATE 100 MG PO TABS: 100.0000 mg | ORAL_TABLET | Freq: Two times a day (BID) | ORAL | 3 refills | Status: DC

## 2018-03-17 NOTE — Patient Instructions (Signed)
Medication Instructions:  Your physician has recommended you make the following change in your medication:  1. START Flecainide 100 mg twice daily --- YOU WILL START THIS MEDICATION 7-10 DAYS PRIOR TO STRESS TESTING  Labwork: None ordered  Testing/Procedures: Your physician has requested that you have an exercise tolerance test - YOU WILL START FLECAINIDE 7-10 DAYS PRIOR TO THIS TEST. For further information please visit [email or web address] Please also follow instruction sheet, as given.  Follow-Up: Your physician recommends that you schedule a follow-up appointment in: 3 months with Dr. Elberta Fortis.   * If you need a refill on your cardiac medications before your next appointment, please call your pharmacy.   *Please note that any paperwork needing to be filled out by the provider will need to be addressed at the front desk prior to seeing the provider. Please note that any FMLA, disability or other documents regarding health condition is subject to a $25.00 charge that must be received prior to completion of paperwork in the form of a money order or check.  Thank you for choosing [facility]!!   Dory Horn, RN [phone]  Any Other Special Instructions Will Be Listed Below (If Applicable).  Flecainide tablets What is this medicine? FLECAINIDE (FLEK a nide) is an antiarrhythmic drug. This medicine is used to prevent irregular heart rhythm. It can also slow down fast heartbeats called tachycardia. This medicine may be used for other purposes; ask your health care provider or pharmacist if you have questions. COMMON BRAND NAME(S): Tambocor What should I tell my health care provider before I take this medicine? They need to know if you have any of these conditions: -abnormal levels of potassium in the blood -heart disease including heart rhythm and heart rate problems -kidney or liver disease -recent heart attack -an unusual or allergic reaction to flecainide, local  anesthetics, other medicines, foods, dyes, or preservatives -pregnant or trying to get pregnant -breast-feeding How should I use this medicine? Take this medicine by mouth with a glass of water. Follow the directions on the prescription label. You can take this medicine with or without food. Take your doses at regular intervals. Do not take your medicine more often than directed. Do not stop taking this medicine suddenly. This may cause serious, heart-related side effects. If your doctor wants you to stop the medicine, the dose may be slowly lowered over time to avoid any side effects. Talk to your pediatrician regarding the use of this medicine in children. While this drug may be prescribed for children as young as 1 year of age for selected conditions, precautions do apply. Overdosage: If you think you have taken too much of this medicine contact a poison control center or emergency room at once. NOTE: This medicine is only for you. Do not share this medicine with others. What if I miss a dose? If you miss a dose, take it as soon as you can. If it is almost time for your next dose, take only that dose. Do not take double or extra doses. What may interact with this medicine? Do not take this medicine with any of the following medications: -amoxapine -arsenic trioxide -certain antibiotics like clarithromycin, erythromycin, gatifloxacin, gemifloxacin, levofloxacin, moxifloxacin, sparfloxacin, or troleandomycin -certain antidepressants called tricyclic antidepressants like amitriptyline, imipramine, or nortriptyline -certain medicines to control heart rhythm like disopyramide, dofetilide, encainide, moricizine, procainamide, propafenone, and quinidine -cisapride -cyclobenzaprine -delavirdine -droperidol -haloperidol -hawthorn -imatinib -levomethadyl -maprotiline -medicines for malaria like chloroquine and halofantrine -pentamidine -phenothiazines like chlorpromazine, mesoridazine,  prochlorperazine, thioridazine -  pimozide -quinine -ranolazine -ritonavir -sertindole -ziprasidone This medicine may also interact with the following medications: -cimetidine -medicines for angina or high blood pressure -medicines to control heart rhythm like amiodarone and digoxin This list may not describe all possible interactions. Give your health care provider a list of all the medicines, herbs, non-prescription drugs, or dietary supplements you use. Also tell them if you smoke, drink alcohol, or use illegal drugs. Some items may interact with your medicine. What should I watch for while using this medicine? Visit your doctor or health care professional for regular checks on your progress. Because your condition and the use of this medicine carries some risk, it is a good idea to carry an identification card, necklace or bracelet with details of your condition, medications and doctor or health care professional. Check your blood pressure and pulse rate regularly. Ask your health care professional what your blood pressure and pulse rate should be, and when you should contact him or her. Your doctor or health care professional also may schedule regular blood tests and electrocardiograms to check your progress. You may get drowsy or dizzy. Do not drive, use machinery, or do anything that needs mental alertness until you know how this medicine affects you. Do not stand or sit up quickly, especially if you are an older patient. This reduces the risk of dizzy or fainting spells. Alcohol can make you more dizzy, increase flushing and rapid heartbeats. Avoid alcoholic drinks. What side effects may I notice from receiving this medicine? Side effects that you should report to your doctor or health care professional as soon as possible: -chest pain, continued irregular heartbeats -difficulty breathing -swelling of the legs or feet -trembling, shaking -unusually weak or tired Side effects that usually  do not require medical attention (report to your doctor or health care professional if they continue or are bothersome): -blurred vision -constipation -headache -nausea, vomiting -stomach pain This list may not describe all possible side effects. Call your doctor for medical advice about side effects. You may report side effects to FDA at 1-800-FDA-1088. Where should I keep my medicine? Keep out of the reach of children. Store at room temperature between 15 and 30 degrees C (59 and 86 degrees F). Protect from light. Keep container tightly closed. Throw away any unused medicine after the expiration date. NOTE: This sheet is a summary. It may not cover all possible information. If you have questions about this medicine, talk to your doctor, pharmacist, or health care provider.  2018 Elsevier/Gold Standard (2008-02-08 16:46:09)   Exercise Stress Electrocardiogram An exercise stress electrocardiogram is a test that is done to evaluate the blood supply to your heart. This test may also be called exercise stress electrocardiography. The test is done while you are walking on a treadmill. The goal of this test is to raise your heart rate. This test is done to find areas of poor blood flow to the heart by determining the extent of coronary artery disease (CAD). CAD is defined as narrowing in one or more heart (coronary) arteries of more than 70%. If you have an abnormal test result, this may mean that you are not getting adequate blood flow to your heart during exercise. Additional testing may be needed to understand why your test was abnormal. Tell a health care provider about:  Any allergies you have.  All medicines you are taking, including vitamins, herbs, eye drops, creams, and over-the-counter medicines.  Any problems you or family members have had with anesthetic medicines.  Any  blood disorders you have.  Any surgeries you have had.  Any medical conditions you have.  Possibility of  pregnancy, if this applies. What are the risks? Generally, this is a safe procedure. However, as with any procedure, complications can occur. Possible complications can include:  Pain or pressure in the following areas: ? Chest. ? Jaw or neck. ? Between your shoulder blades. ? Radiating down your left arm.  Dizziness or light-headedness.  Shortness of breath.  Increased or irregular heartbeats.  Nausea or vomiting.  Heart attack (rare).  What happens before the procedure?  Avoid all forms of caffeine 24 hours before your test or as directed by your health care provider. This includes coffee, tea (even decaffeinated tea), caffeinated sodas, chocolate, cocoa, and certain pain medicines.  Follow your health care provider's instructions regarding eating and drinking before the test.  Take your medicines as directed at regular times with water unless instructed otherwise. Exceptions may include: ? If you have diabetes, ask how you are to take your insulin or pills. It is common to adjust insulin dosing the morning of the test. ? If you are taking beta-blocker medicines, it is important to talk to your health care provider about these medicines well before the date of your test. Taking beta-blocker medicines may interfere with the test. In some cases, these medicines need to be changed or stopped 24 hours or more before the test. ? If you wear a nitroglycerin patch, it may need to be removed prior to the test. Ask your health care provider if the patch should be removed before the test.  If you use an inhaler for any breathing condition, bring it with you to the test.  If you are an outpatient, bring a snack so you can eat right after the stress phase of the test.  Do not smoke for 4 hours prior to the test or as directed by your health care provider.  Do not apply lotions, powders, creams, or oils on your chest prior to the test.  Wear loose-fitting clothes and comfortable shoes for  the test. This test involves walking on a treadmill. What happens during the procedure?  Multiple patches (electrodes) will be put on your chest. If needed, small areas of your chest may have to be shaved to get better contact with the electrodes. Once the electrodes are attached to your body, multiple wires will be attached to the electrodes and your heart rate will be monitored.  Your heart will be monitored both at rest and while exercising.  You will walk on a treadmill. The treadmill will be started at a slow pace. The treadmill speed and incline will gradually be increased to raise your heart rate. What happens after the procedure?  Your heart rate and blood pressure will be monitored after the test.  You may return to your normal schedule including diet, activities, and medicines, unless your health care provider tells you otherwise. This information is not intended to replace advice given to you by your health care provider. Make sure you discuss any questions you have with your health care provider. Document Released: 10/02/2000 Document Revised: 03/12/2016 Document Reviewed: 06/12/2013 Elsevier Interactive Patient Education  2017 [employer].

## 2018-03-17 NOTE — Progress Notes (Signed)
Electrophysiology Office Note   Date:  03/17/2018   ID:  Caroline Boone, DOB [birth year], MRN [id number]  PCP:  Nelwyn Salisbury, MD  Cardiologist:  Hochrein Primary Electrophysiologist:  Seyon Strader Jorja Loa, MD    Chief Complaint  Patient presents with  . Palpitations     History of Present Illness: Caroline Boone is a 52 y.o. female who is being seen today for the evaluation of NSVT at the request of Rollene Rotunda. Presenting today for electrophysiology evaluation.  She has a history of carotid stenosis, cerebral aneurysm, hypertension, palpitations.  He underwent cardiac cath 01/12/2018 which revealed normal coronary anatomy.  Per hospital records, she had a 14 beat run of nonsustained ventricular tachycardia.  She has had palpitations since that time.  She has had episodes where her heart goes both fast and slow.  Her palpitations have been quite irregularly.  She might have a week where she has been quite a bit and then they go away.  She says that she feels hot and flushed when she has them.  They last up to a few seconds at a time.   Today, she denies symptoms of chest pain, shortness of breath, orthopnea, PND, lower extremity edema, claudication, dizziness, presyncope, syncope, bleeding, or neurologic sequela. The patient is tolerating medications without difficulties.    Past Medical History:  Diagnosis Date  . Anxiety   . Carotid artery injury   . Carotid stenosis    Dopplers 6/12:0-39% bilateral; distal ICAs with marked tortuosity; CT angiogram recommended;  followed at [facility]; surgery to correct LICA stenosis and pseudoaneurysm too risky - > medical Rx;  dopplers 8/13: B/L 0-39% (stable)  . Cerebral aneurysm   . Chest pain    GXT echo 8/11: normal LVF, no ischemia;  h/o neg. myoview in 2005  . Family history of adverse reaction to anesthesia    MOMTHER HAD TROUBLE WAKING  . Fibromuscular dysplasia (HCC)    affects left vertebral,  artery, left ICA, left renal artery;   head and neck CTA 10/24/11: stable pattern of fibromuscular dysplasia of ICA and VA with distal changes suggesting prior pseudoaneurysm and dissection; L dist ICA 60%; normal MRI 10/29/11;  followed at [facility] with serial CT scans  . GERD (gastroesophageal reflux disease)   . Headache   . History of cardiac cath 2007   cath 11/25/05 by Dr. Jenne Campus with normal cors and EF 50%  . Hypertension    a. echo 7/11: mod LVH, EF 55-65%  . Insomnia   . Palpitations    hx  . 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 Prescott Parma  at [facility]   Past Surgical History:  Procedure Laterality Date  . BREAST ENHANCEMENT SURGERY  1991   Bilateral breast   . COLONOSCOPY  04/27/2014   per Dr. Loreta Ave, tubular adenomas, repeat in 10 yrs   . LEFT HEART CATH AND CORONARY ANGIOGRAPHY N/A 01/12/2018   Procedure: LEFT HEART CATH AND CORONARY ANGIOGRAPHY;  Surgeon: Swazil[place], Peter M, MD;  Location: [facility] INVASIVE CV LAB;  Service: Cardiovascular;  Laterality: N/A;     Current Outpatient Medications  Medication Sig Dispense Refill  . acetaminophen (TYLENOL) 500 MG tablet Take 1,000 mg by mouth every 6 (six) hours as needed for moderate pain.    Marland Kitchen ALPRAZolam (XANAX) 1 MG tablet TAKE 1 TABLET BY MOUTH THREE TIMES DAILY 270 tablet 0  . aspirin 81 MG tablet Take 81 mg by  mouth daily.      . cloNIDine (CATAPRES) 0.1 MG tablet Take 0.1 mg by mouth as needed (for high BP; takes along with lisinopril as needed).     . cyclobenzaprine (FLEXERIL) 10 MG tablet take 1 tablet by mouth three times a day if needed for muscle spasm 90 tablet 5  . [START ON 05/16/2018] HYDROcodone-acetaminophen (NORCO) 5-325 MG tablet Take 1 tablet by mouth every 6 (six) hours as needed for moderate pain. 120 tablet 0  . lisinopril (PRINIVIL,ZESTRIL) 20 MG tablet Take 20 mg by mouth at bedtime.     . metoprolol succinate (TOPROL-XL) 50 MG 24 hr tablet Take 1 tablet (50 mg total) by mouth at bedtime. 90 tablet 3    . omeprazole (PRILOSEC) 40 MG capsule TAKE ONE CAPSULE BY MOUTH ONCE DAILY 30 capsule 5  . phentermine 30 MG capsule TAKE ONE CAPSULE BY MOUTH EVERY MORNING 30 capsule 5  . promethazine (PHENERGAN) 25 MG tablet Take 1 tablet (25 mg total) by mouth every 4 (four) hours as needed for nausea or vomiting. 60 tablet 2  . zolpidem (AMBIEN) 10 MG tablet TAKE 1 TABLET BY MOUTH EVERY NIGHT AT BEDTIME AS NEEDED 30 tablet 5   No current facility-administered medications for this visit.     Allergies:   Amoxicillin and Sulfonamide derivatives   Social History:  The patient  reports that she has never smoked. She has never used smokeless tobacco. She reports that she does not drink alcohol or use drugs.   Family History:  The patient's family history includes Arthritis in her unknown relative; Breast cancer in her unknown relative; Coronary artery disease in her mother; Diabetes in her unknown relative; Heart attack (age of onset: 40) in her father; Heart disease in her father; Heart disease (age of onset: 36) in her mother; Hypertension in her father and unknown relative; Kidney disease in her mother and unknown relative; Kidney failure in her father; Lung cancer in her unknown relative.    ROS:  Please see the history of present illness.   Otherwise, review of systems is positive for palpitations, anxiety, back pain, dizziness, headaches, easy bruising.   All other systems are reviewed and negative.    PHYSICAL EXAM: VS:  BP 120/90   Pulse 91   Ht  (1.575 m)   Wt 152 lb 3.2 oz (69 kg)   LMP 06/14/2011   SpO2 99%   BMI 27.84 kg/m  , BMI Body mass index is 27.84 kg/m. GEN: Well nourished, well developed, in no acute distress  HEENT: normal  Neck: no JVD, carotid bruits, or masses Cardiac: RRR; no murmurs, rubs, or gallops,no edema  Respiratory:  clear to auscultation bilaterally, normal work of breathing GI: soft, nontender, nondistended, + BS MS: no deformity or atrophy  Skin: warm and  dry Neuro:  Strength and sensation are intact Psych: euthymic mood, full affect  EKG:  EKG is not ordered today. Personal review of the ekg ordered 01/11/18 shows SR, rate 92, PVC  Recent Labs: 06/01/2017: ALT 11 01/11/2018: BUN 7; Potassium 3.6; Sodium 135 01/12/2018: Creatinine, Ser 0.50; Hemoglobin 13.0; Magnesium 2.0; Platelets 351; TSH 1.276    Lipid Panel     Component Value Date/Time   CHOL 205 (H) 05/06/2017 0948   TRIG 49.0 05/06/2017 0948   HDL 80.90 05/06/2017 0948   CHOLHDL 3 05/06/2017 0948   VLDL 9.8 05/06/2017 0948   LDLCALC 114 (H) 05/06/2017 0948     Wt Readings from Last 3  Encounters:  03/17/18 152 lb 3.2 oz (69 kg)  03/16/18 153 lb 14.4 oz (69.8 kg)  02/22/18 152 lb (68.9 kg)      Other studies Reviewed: Additional studies/ records that were reviewed today include: TTE 01/13/18  Review of the above records today demonstrates:  - Left ventricle: The cavity size was normal. Wall thickness was   normal. Systolic function was vigorous. The estimated ejection   fraction was in the range of 65% to 70%. Wall motion was normal;   there were no regional wall motion abnormalities. Doppler   parameters are consistent with abnormal left ventricular   relaxation (grade 1 diastolic dysfunction). The E/e&' ratio is   between 8-15, suggesting indeterminate LV filling pressure. - Mitral valve: Mildly thickened leaflets . There was trivial   regurgitation. - Left atrium: The atrium was normal in size. - Inferior vena cava: The vessel was normal in size. The   respirophasic diameter changes were in the normal range (>= 50%),   consistent with normal central venous pressure.  LHC 01/12/18  The left ventricular systolic function is normal.  LV end diastolic pressure is normal.  The left ventricular ejection fraction is 55-65% by visual estimate.   1. Normal coronary anatomy 2. Normal LV function 3. Normal LVEDP  30 day monitor 02/06/18 - personally  reviewed NSR, PVCs Runs of NSVT, longest 14 beats   ASSESSMENT AND PLAN:  1.  Nonsustained VT: Is currently on Toprol-XL and is feeling well.  This is improved some of her palpitations.  She is continued to have symptoms though and would like to undergo further therapy.  Due to her fibromuscular dysplasia and the rarity of her symptoms, would plan to hold off on ablation.  We Breydon Senters start her on flecainide 100 mg.  2.  Hypertension: Well-controlled.  No changes.  3.  Fibromuscular dysplasia: Currently being managed conservatively  Current medicines are reviewed at length with the patient today.   The patient does not have concerns regarding her medicines.  The following changes were made today:  none  Labs/ tests ordered today include:  No orders of the defined types were placed in this encounter.  Case discussed with referring  Disposition:   FU with Imara Standiford 3 months  Signed, Nellie Pester Jorja Loa, MD  03/17/2018 9:28 AM     [facility] [address] [place] [address] [phone] (office) [phone] (fax)

## 2018-03-22 DIAGNOSIS — H16223 Keratoconjunctivitis sicca, not specified as Sjogren's, bilateral: Secondary | ICD-10-CM | POA: Diagnosis not present

## 2018-03-24 ENCOUNTER — Ambulatory Visit (INDEPENDENT_AMBULATORY_CARE_PROVIDER_SITE_OTHER): Payer: 59

## 2018-03-24 ENCOUNTER — Encounter (INDEPENDENT_AMBULATORY_CARE_PROVIDER_SITE_OTHER): Payer: Self-pay

## 2018-03-24 DIAGNOSIS — I472 Ventricular tachycardia, unspecified: Secondary | ICD-10-CM

## 2018-03-24 DIAGNOSIS — Z79899 Other long term (current) drug therapy: Secondary | ICD-10-CM | POA: Diagnosis not present

## 2018-03-24 LAB — EXERCISE TOLERANCE TEST
CHL CUP MPHR: 167 {beats}/min
CSEPHR: 85 %
CSEPPHR: 142 {beats}/min
Estimated workload: 10.1 METS
Exercise duration (min): 8 min
Exercise duration (sec): 35 s
RPE: 17
Rest HR: 80 {beats}/min

## 2018-05-04 ENCOUNTER — Telehealth: Payer: Self-pay

## 2018-05-04 ENCOUNTER — Other Ambulatory Visit: Payer: Self-pay | Admitting: Family Medicine

## 2018-05-04 MED ORDER — ALPRAZOLAM 1 MG PO TABS: 1.0000 mg | ORAL_TABLET | Freq: Three times a day (TID) | ORAL | 0 refills | Status: DC

## 2018-05-04 NOTE — Telephone Encounter (Signed)
Pt need this medication refill by mistake I had printed off RX for xanax   However, nothing has been sent because this will need the provider approval first   Last OV 03/16/2018   Last refilled 01/26/2018 disp 270 with no refills

## 2018-05-04 NOTE — Telephone Encounter (Signed)
Last OV 03/17/2018   Last refilled 01/26/2018 disp 270 with no refills   Sent to PCP for approval

## 2018-05-04 NOTE — Telephone Encounter (Signed)
Fax did look like it went through will call into pharmacy.

## 2018-05-04 NOTE — Telephone Encounter (Signed)
The rx was signed

## 2018-05-26 ENCOUNTER — Encounter: Payer: Self-pay | Admitting: *Deleted

## 2018-06-07 ENCOUNTER — Telehealth: Payer: Self-pay | Admitting: Family Medicine

## 2018-06-07 NOTE — Telephone Encounter (Signed)
Copied from CRM [id number]. Topic: Quick Communication - Rx Refill/Question >> Jun 07, 2018  9:39 AM Darletta MollLander, Lumin L wrote: Medication: HYDROcodone-acetaminophen (NORCO) 5-325 MG tablet (please call patient to let her know if she needs an appointment)  Has the patient contacted their pharmacy? Yes.   (Agent: If no, request that the patient contact the pharmacy for the refill.) (Agent: If yes, when and what did the pharmacy advise?)  Preferred Pharmacy (with phone number or street name): [id number] [place], [place] - [address] [facility] [address] [facility] [address] [place] [id number] Phone: [phone] Fax: [phone]   Agent: Please be advised that RX refills may take up to 3 business days. We ask that you follow-up with your pharmacy.

## 2018-06-07 NOTE — Telephone Encounter (Signed)
Last OV 03/16/2018   Pt is due for a pain management office visit   Please call pt to schedule an appt thanks

## 2018-06-10 ENCOUNTER — Encounter: Payer: Self-pay | Admitting: Family Medicine

## 2018-06-10 ENCOUNTER — Ambulatory Visit: Payer: 59 | Admitting: Family Medicine

## 2018-06-10 VITALS — BP 110/78 | HR 89 | Temp 98.5°F | Ht 62.0 in | Wt 155.4 lb

## 2018-06-10 DIAGNOSIS — F1190 Opioid use, unspecified, uncomplicated: Secondary | ICD-10-CM | POA: Diagnosis not present

## 2018-06-10 DIAGNOSIS — G8929 Other chronic pain: Secondary | ICD-10-CM

## 2018-06-10 DIAGNOSIS — M545 Low back pain, unspecified: Secondary | ICD-10-CM

## 2018-06-10 MED ORDER — HYDROCODONE-ACETAMINOPHEN 5-325 MG PO TABS: 1.0000 | ORAL_TABLET | Freq: Four times a day (QID) | ORAL | 0 refills | Status: DC | PRN

## 2018-06-10 MED ORDER — HYDROCODONE-ACETAMINOPHEN 5-325 MG PO TABS: 1.0000 | ORAL_TABLET | Freq: Four times a day (QID) | ORAL | 0 refills | Status: AC | PRN

## 2018-06-10 NOTE — Progress Notes (Signed)
   Subjective:    Patient ID: Caroline Boone, female    DOB: [birth year], 52 y.o.   MRN: [id number]  HPI Here for pain management. She is doing well.  Indication for chronic opioid: low back pain  Medication and dose: Norco 5-325 # pills per month: 120 Last UDS date: 10-26-17 Opioid Treatment Agreement signed (Y/N): 03-16-18           Opioid Treatment Agreement last reviewed with patient:  06-10-18 NCCSRS reviewed this encounter (include red flags):  06-10-18    Review of Systems  Constitutional: Negative.   Respiratory: Negative.   Cardiovascular: Negative.   Musculoskeletal: Positive for back pain.  Neurological: Negative.        Objective:   Physical Exam  Constitutional: She is oriented to person, place, and time. She appears well-developed and well-nourished.  Cardiovascular: Normal rate, regular rhythm, normal heart sounds and intact distal pulses.  Pulmonary/Chest: Effort normal and breath sounds normal.  Neurological: She is alert and oriented to person, place, and time.          Assessment & Plan:  Pain management, meds were refilled.  Gershon CraneStephen Fry, MD

## 2018-06-11 ENCOUNTER — Other Ambulatory Visit: Payer: Self-pay | Admitting: Family Medicine

## 2018-06-13 NOTE — Telephone Encounter (Signed)
Last OV 06/10/2018   Last refilled 12/24/2017 disp 30 with 5 refills   Sent to PCP to advise

## 2018-06-14 NOTE — Telephone Encounter (Signed)
Call in #[phone] with 5 rf 

## 2018-06-16 ENCOUNTER — Encounter: Payer: Self-pay | Admitting: Cardiology

## 2018-06-16 ENCOUNTER — Ambulatory Visit: Payer: 59 | Admitting: Cardiology

## 2018-06-16 VITALS — BP 108/70 | HR 88 | Ht 62.0 in | Wt 157.0 lb

## 2018-06-16 DIAGNOSIS — I472 Ventricular tachycardia: Secondary | ICD-10-CM

## 2018-06-16 DIAGNOSIS — I10 Essential (primary) hypertension: Secondary | ICD-10-CM

## 2018-06-16 DIAGNOSIS — I4729 Other ventricular tachycardia: Secondary | ICD-10-CM

## 2018-06-16 NOTE — Progress Notes (Signed)
Electrophysiology Office Note   Date:  06/16/2018   ID:  Caroline Boone, DOB [birth year], MRN [id number]  PCP:  Nelwyn SalisburyFry, Stephen A, MD  Cardiologist:  Hochrein Primary Electrophysiologist:  Devlyn Parish Jorja LoaMartin Jerriann Schrom, MD    No chief complaint on file.    History of Present Illness: Caroline PolioBillie Jo Boone is a 52 y.o. female who is being seen today for the evaluation of NSVT at the request of Rollene RotundaJames Hochrein. Presenting today for electrophysiology evaluation.  She has a history of carotid stenosis, cerebral aneurysm, hypertension, palpitations.  He underwent cardiac cath 01/12/2018 which revealed normal coronary anatomy.  Per hospital records, she had a 14 beat run of nonsustained ventricular tachycardia.  She has had palpitations since that time.  She has had episodes where her heart goes both fast and slow.  Her palpitations have been quite irregularly.  She might have a week where she has been quite a bit and then they go away.  She says that she feels hot and flushed when she has them.  They last up to a few seconds at a time.  Put on flecainide at the last visit.  Only she is having side effects from the flecainide.  She has having blurred vision, clamminess, and heartburn.  She would like to try being off of the flecainide.  She Xaine Sansom continue her metoprolol.  She is not having issues with that medication.   Today, denies symptoms of chest pain, shortness of breath, orthopnea, PND, lower extremity edema, claudication, dizziness, presyncope, syncope, bleeding, or neurologic sequela. The patient is tolerating medications without difficulties.  He does still have occasional palpitations.  These have been helped by the flecainide.  Otherwise she is doing well without complaints.   Past Medical History:  Diagnosis Date  . Anxiety   . Carotid artery injury   . Carotid stenosis    Dopplers 6/12:0-39% bilateral; distal ICAs with marked tortuosity; CT angiogram recommended;  [facility]; surgery to correct  LICA stenosis and pseudoaneurysm too risky - > medical Rx;  dopplers 8/13: B/L 0-39% (stable)  . Cerebral aneurysm   . Chest pain    GXT echo 8/11: normal LVF, no ischemia;  h/o neg. myoview in 2005  . Family history of adverse reaction to anesthesia    MOMTHER HAD TROUBLE WAKING  . Fibromuscular dysplasia (HCC)    affects left vertebral,  artery, left ICA, left renal artery;  head and neck CTA 10/24/11: stable pattern of fibromuscular dysplasia of ICA and VA with distal changes suggesting prior pseudoaneurysm and dissection; L dist ICA 60%; normal MRI 10/29/11;  [facility]  . GERD (gastroesophageal reflux disease)   . Headache   . History of cardiac cath 2007   cath 11/25/05 by Dr. Jenne CampusMcQueen with normal cors and EF 50%  . Hypertension    a. echo 7/11: mod LVH, EF 55-65%  . Insomnia   . Palpitations    hx  . 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   Past Surgical History:  Procedure Laterality Date  . BREAST ENHANCEMENT SURGERY  1991   Bilateral breast   . COLONOSCOPY  04/27/2014   per Dr. Loreta AveMann, tubular adenomas, repeat in 10 yrs   . LEFT HEART CATH AND CORONARY ANGIOGRAPHY N/A 01/12/2018   Procedure: LEFT HEART CATH AND CORONARY ANGIOGRAPHY;  Surgeon: SwazilandJordan, Peter M, MD;  [facility];  Service: Cardiovascular;  Laterality: N/A;     Current Outpatient Medications  Medication Sig Dispense Refill  . acetaminophen (TYLENOL) 500 MG tablet Take 1,000 mg by mouth every 6 (six) hours as needed for moderate pain.    Marland Kitchen ALPRAZolam (XANAX) 1 MG tablet Take 1 tablet (1 mg total) by mouth 3 (three) times daily. 270 tablet 0  . aspirin 81 MG tablet Take 81 mg by mouth daily.      . cloNIDine (CATAPRES) 0.1 MG tablet Take 0.1 mg by mouth as needed (for high BP; takes along with lisinopril as needed).     . cyclobenzaprine (FLEXERIL) 10 MG tablet take 1 tablet by mouth three times a  day if needed for muscle spasm 90 tablet 5  . flecainide (TAMBOCOR) 100 MG tablet Take 1 tablet (100 mg total) by mouth 2 (two) times daily. 60 tablet 3  . hydrochlorothiazide (HYDRODIURIL) 25 MG tablet Take 1 tablet by mouth daily.  6  . [START ON 08/15/2018] HYDROcodone-acetaminophen (NORCO) 5-325 MG tablet Take 1 tablet by mouth every 6 (six) hours as needed for moderate pain. 120 tablet 0  . lisinopril (PRINIVIL,ZESTRIL) 20 MG tablet Take 20 mg by mouth at bedtime.     . metoprolol succinate (TOPROL-XL) 50 MG 24 hr tablet Take 1 tablet (50 mg total) by mouth at bedtime. 90 tablet 3  . omeprazole (PRILOSEC) 40 MG capsule TAKE ONE CAPSULE BY MOUTH ONCE DAILY 30 capsule 5  . phentermine 30 MG capsule TAKE ONE CAPSULE BY MOUTH EVERY MORNING 30 capsule 5  . promethazine (PHENERGAN) 25 MG tablet Take 1 tablet (25 mg total) by mouth every 4 (four) hours as needed for nausea or vomiting. 60 tablet 2  . zolpidem (AMBIEN) 10 MG tablet TAKE 1 TABLET BY MOUTH EVERY NIGHT AT BEDTIME AS NEEDED 30 tablet 5   No current facility-administered medications for this visit.     Allergies:   Amoxicillin and Sulfonamide derivatives   Social History:  The patient  reports that she has never smoked. She has never used smokeless tobacco. She reports that she does not drink alcohol or use drugs.   Family History:  The patient's family history includes Arthritis in her unknown relative; Breast cancer in her unknown relative; Coronary artery disease in her mother; Diabetes in her unknown relative; Heart attack (age of onset: 61) in her father; Heart disease in her father; Heart disease (age of onset: 64) in her mother; Hypertension in her father and unknown relative; Kidney disease in her mother and unknown relative; Kidney failure in her father; Lung cancer in her unknown relative.   ROS:  Please see the history of present illness.   Otherwise, review of systems is positive for dizziness.   All other systems are  reviewed and negative.   PHYSICAL EXAM: VS:  BP 108/70   Pulse 88   Ht 5\' 2"  (1.575 m)   Wt 157 lb (71.2 kg)   LMP 06/14/2011   SpO2 97%   BMI 28.72 kg/m  , BMI Body mass index is 28.72 kg/m. GEN: Well nourished, well developed, in no acute distress  HEENT: normal  Neck: no JVD, carotid bruits, or masses Cardiac: RRR; no murmurs, rubs, or gallops,no edema  Respiratory:  clear to auscultation bilaterally, normal work of breathing GI: soft, nontender, nondistended, + BS MS: no deformity or atrophy  Skin: warm and dry Neuro:  Strength and sensation are intact Psych: euthymic mood, full affect  EKG:  EKG is ordered today.  Personal review of the ekg ordered shows this rhythm, rate 88, QTc 488 ms  Recent Labs: 01/11/2018: BUN 7; Potassium 3.6; Sodium 135 01/12/2018: Creatinine, Ser 0.50; Hemoglobin 13.0; Magnesium 2.0; Platelets 351; TSH 1.276    Lipid Panel     Component Value Date/Time   CHOL 205 (H) 05/06/2017 0948   TRIG 49.0 05/06/2017 0948   HDL 80.90 05/06/2017 0948   CHOLHDL 3 05/06/2017 0948   VLDL 9.8 05/06/2017 0948   LDLCALC 114 (H) 05/06/2017 0948     Wt Readings from Last 3 Encounters:  06/16/18 157 lb (71.2 kg)  06/10/18 155 lb 6.4 oz (70.5 kg)  03/17/18 152 lb 3.2 oz (69 kg)      Other studies Reviewed: Additional studies/ records that were reviewed today include: TTE 01/13/18  Review of the above records today demonstrates:  - Left ventricle: The cavity size was normal. Wall thickness was   normal. Systolic function was vigorous. The estimated ejection   fraction was in the range of 65% to 70%. Wall motion was normal;   there were no regional wall motion abnormalities. Doppler   parameters are consistent with abnormal left ventricular   relaxation (grade 1 diastolic dysfunction). The E/e&' ratio is   between 8-15, suggesting indeterminate LV filling pressure. - Mitral valve: Mildly thickened leaflets . There was trivial   regurgitation. - Left  atrium: The atrium was normal in size. - Inferior vena cava: The vessel was normal in size. The   respirophasic diameter changes were in the normal range (>= 50%),   consistent with normal central venous pressure.  LHC 01/12/18  The left ventricular systolic function is normal.  LV end diastolic pressure is normal.  The left ventricular ejection fraction is 55-65% by visual estimate.   1. Normal coronary anatomy 2. Normal LV function 3. Normal LVEDP  30-day monitor 02/06/2018 personally reviewed Sinus rhythm, PVCs, nonsustained VT 14 beats  ETT 03/24/18 - personally reviewed  Blood pressure demonstrated a normal response to exercise.  There was no ST segment deviation noted during stress.  Clinically and electrically negative for ischemia.  Negative GXT  ASSESSMENT AND PLAN:  1.  Nonsustained VT: Had improvement in her palpitations with Toprol-XL.  She was put on flecainide but did have side effects.  We Kristalyn Bergstresser stop her flecainide and see how she does.  Should she continue to have issues, she may benefit from Rythmol.  2.  Hypertension: Well-controlled.  No changes.  3.  Fibromuscular dysplasia: Currently being managed conservatively  Current medicines are reviewed at length with the patient today.   The patient does not have concerns regarding her medicines.  The following changes were made today: Stop flecainide  Labs/ tests ordered today include:  Orders Placed This Encounter  Procedures  . EKG 12-Lead   Disposition:   FU with Nathalya Wolanski 3 months  Signed, Lydia Meng Jorja Loa, MD  06/16/2018 8:16 AM     [facility] [address] [place] [address] [phone] (office) [phone] (fax)

## 2018-06-16 NOTE — Patient Instructions (Signed)
Medication Instructions:  Your physician has recommended you make the following change in your medication: 1. STOP Flecainide  * If you need a refill on your cardiac medications before your next appointment, please call your pharmacy. *  Labwork: None ordered  Testing/Procedures: None ordered  Follow-Up: Your physician recommends that you schedule a follow-up appointment in: 3 months with Dr. Camnitz.  Thank you for choosing [facility]!!   Sherri Price, RN [phone]      

## 2018-07-15 DIAGNOSIS — Z23 Encounter for immunization: Secondary | ICD-10-CM | POA: Diagnosis not present

## 2018-08-19 DIAGNOSIS — Z0000 Encounter for general adult medical examination without abnormal findings: Secondary | ICD-10-CM | POA: Diagnosis not present

## 2018-08-19 DIAGNOSIS — I10 Essential (primary) hypertension: Secondary | ICD-10-CM | POA: Diagnosis not present

## 2018-08-19 DIAGNOSIS — I671 Cerebral aneurysm, nonruptured: Secondary | ICD-10-CM | POA: Diagnosis not present

## 2018-08-19 DIAGNOSIS — E785 Hyperlipidemia, unspecified: Secondary | ICD-10-CM | POA: Diagnosis not present

## 2018-08-19 DIAGNOSIS — I773 Arterial fibromuscular dysplasia: Secondary | ICD-10-CM | POA: Diagnosis not present

## 2018-08-29 ENCOUNTER — Other Ambulatory Visit: Payer: Self-pay | Admitting: Family Medicine

## 2018-08-30 ENCOUNTER — Encounter: Payer: Self-pay | Admitting: Cardiology

## 2018-08-31 NOTE — Telephone Encounter (Signed)
Patient is calling to follow up on this request

## 2018-09-02 ENCOUNTER — Other Ambulatory Visit: Payer: Self-pay | Admitting: Nephrology

## 2018-09-02 ENCOUNTER — Other Ambulatory Visit: Payer: Self-pay | Admitting: Family Medicine

## 2018-09-02 DIAGNOSIS — M810 Age-related osteoporosis without current pathological fracture: Secondary | ICD-10-CM

## 2018-09-02 NOTE — Telephone Encounter (Signed)
Relation to pt: self  Call back number: [phone] Pharmacy: [facility][id number] - [place], [place] - [address] [facility] [address] [facility] [address] [phone] (Phone) [phone] (Fax)     Reason for call:  Patient checking on the status of message below and states its been more then 72 hour turn around time. Please advise

## 2018-09-04 ENCOUNTER — Other Ambulatory Visit: Payer: Self-pay | Admitting: Family Medicine

## 2018-09-05 NOTE — Telephone Encounter (Signed)
Done

## 2018-09-05 NOTE — Telephone Encounter (Signed)
Pt is calling to follow up on her medication request for  ALPRAZolam 1 MG.

## 2018-09-19 NOTE — Progress Notes (Signed)
Electrophysiology Office Note   Date:  09/20/2018   ID:  Caroline Boone, DOB 03/29/1966, MRN [id number]  PCP:  Nelwyn Salisbury, MD  Cardiologist:  Hochrein Primary Electrophysiologist:  Cathlyn Tersigni Jorja Loa, MD    No chief complaint on file.    History of Present Illness: Caroline Boone is a 52 y.o. female who is being seen today for the evaluation of NSVT at the request of Caroline Boone. Presenting today for electrophysiology evaluation.  She has a history of carotid stenosis, cerebral aneurysm, hypertension, palpitations.  He underwent cardiac cath 01/12/2018 which revealed normal coronary anatomy.  Per hospital records, she had a 14 beat run of nonsustained ventricular tachycardia.  She has had palpitations since that time.  She has had episodes where her heart goes both fast and slow.  Her palpitations have been quite irregularly.  She might have a week where she has been quite a bit and then they go away.  She says that she feels hot and flushed when she has them.  They last up to a few seconds at a time.  Put on flecainide at the last visit.  Only she is having side effects from the flecainide.  She has having blurred vision, clamminess, and heartburn.  She would like to try being off of the flecainide.  She Caroline Boone continue her metoprolol.  She is not having issues with that medication. Flecainide was stopped due to side effects.   Today, denies symptoms of palpitations, chest pain, shortness of breath, orthopnea, PND, lower extremity edema, claudication, dizziness, presyncope, syncope, bleeding, or neurologic sequela. The patient is tolerating medications without difficulties.  Overall she is doing well.  She has continued to have episodic palpitations.  She is taken a few extra doses of her Toprol-XL.  She otherwise is felt well without major issue.   Past Medical History:  Diagnosis Date  . Anxiety   . Carotid artery injury   . Carotid stenosis    Dopplers 6/12:0-39% bilateral;  distal ICAs with marked tortuosity; CT angiogram recommended;  followed at [facility]; surgery to correct LICA stenosis and pseudoaneurysm too risky - > medical Rx;  dopplers 8/13: B/L 0-39% (stable)  . Cerebral aneurysm   . Chest pain    GXT echo 8/11: normal LVF, no ischemia;  h/o neg. myoview in 2005  . Family history of adverse reaction to anesthesia    MOMTHER HAD TROUBLE WAKING  . Fibromuscular dysplasia (HCC)    affects left vertebral,  artery, left ICA, left renal artery;  head and neck CTA 10/24/11: stable pattern of fibromuscular dysplasia of ICA and VA with distal changes suggesting prior pseudoaneurysm and dissection; L dist ICA 60%; normal MRI 10/29/11;  followed at [facility] with serial CT scans  . GERD (gastroesophageal reflux disease)   . Headache   . History of cardiac cath 2007   cath 11/25/05 by Dr. Jenne Campus with normal cors and EF 50%  . Hypertension    a. echo 7/11: mod LVH, EF 55-65%  . Insomnia   . Palpitations    hx  . 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 Prescott Parma  at [facility]   Past Surgical History:  Procedure Laterality Date  . BREAST ENHANCEMENT SURGERY  1991   Bilateral breast   . COLONOSCOPY  04/27/2014   per Dr. Loreta Ave, tubular adenomas, repeat in 10 yrs   . LEFT HEART CATH AND CORONARY ANGIOGRAPHY N/A 01/12/2018  Procedure: LEFT HEART CATH AND CORONARY ANGIOGRAPHY;  Surgeon: Swaziland, Peter M, MD;  Location: [facility] INVASIVE CV LAB;  Service: Cardiovascular;  Laterality: N/A;     Current Outpatient Medications  Medication Sig Dispense Refill  . acetaminophen (TYLENOL) 500 MG tablet Take 1,000 mg by mouth every 6 (six) hours as needed for moderate pain.    Marland Kitchen ALPRAZolam (XANAX) 1 MG tablet TAKE 1 TABLET BY MOUTH THREE TIMES DAILY 270 tablet 1  . aspirin 81 MG tablet Take 81 mg by mouth daily.      . cloNIDine (CATAPRES) 0.1 MG tablet Take 0.1 mg by mouth as needed (for high BP; takes along with lisinopril as needed).      . cyclobenzaprine (FLEXERIL) 10 MG tablet take 1 tablet by mouth three times a day if needed for muscle spasm 90 tablet 5  . hydrochlorothiazide (HYDRODIURIL) 25 MG tablet Take 1 tablet by mouth daily.  6  . lisinopril (PRINIVIL,ZESTRIL) 20 MG tablet Take 20 mg by mouth at bedtime.     . promethazine (PHENERGAN) 25 MG tablet Take 1 tablet (25 mg total) by mouth every 4 (four) hours as needed for nausea or vomiting. 60 tablet 2  . zolpidem (AMBIEN) 10 MG tablet TAKE 1 TABLET BY MOUTH EVERY NIGHT AT BEDTIME AS NEEDED 30 tablet 5  . metoprolol succinate (TOPROL-XL) 50 MG 24 hr tablet Take 1 tablet (50 mg total) by mouth daily. Take with or immediately following a meal. 180 tablet 2   No current facility-administered medications for this visit.     Allergies:   Amoxicillin and Sulfonamide derivatives   Social History:  The patient  reports that she has never smoked. She has never used smokeless tobacco. She reports that she does not drink alcohol or use drugs.   Family History:  The patient's family history includes Arthritis in her unknown relative; Breast cancer in her unknown relative; Coronary artery disease in her mother; Diabetes in her unknown relative; Heart attack (age of onset: 67) in her father; Heart disease in her father; Heart disease (age of onset: 25) in her mother; Hypertension in her father and unknown relative; Kidney disease in her mother and unknown relative; Kidney failure in her father; Lung cancer in her unknown relative.   ROS:  Please see the history of present illness.   Otherwise, review of systems is positive for none.   All other systems are reviewed and negative.   PHYSICAL EXAM: VS:  BP 108/64   Pulse 70   Ht 5\' 2"  (1.575 m)   Wt 153 lb 6.4 oz (69.6 kg)   LMP 06/14/2011   SpO2 98%   BMI 28.06 kg/m  , BMI Body mass index is 28.06 kg/m. GEN: Well nourished, well developed, in no acute distress  HEENT: normal  Neck: no JVD, carotid bruits, or masses Cardiac:  RRR; no murmurs, rubs, or gallops,no edema  Respiratory:  clear to auscultation bilaterally, normal work of breathing GI: soft, nontender, nondistended, + BS MS: no deformity or atrophy  Skin: warm and dry Neuro:  Strength and sensation are intact Psych: euthymic mood, full affect  EKG:  EKG is ordered today. Personal review of the ekg ordered shows SR, rate 70   Recent Labs: 01/11/2018: BUN 7; Potassium 3.6; Sodium 135 01/12/2018: Creatinine, Ser 0.50; Hemoglobin 13.0; Magnesium 2.0; Platelets 351; TSH 1.276    Lipid Panel     Component Value Date/Time   CHOL 205 (H) 05/06/2017 0948   TRIG 49.0 05/06/2017 0948  HDL 80.90 05/06/2017 0948   CHOLHDL 3 05/06/2017 0948   VLDL 9.8 05/06/2017 0948   LDLCALC 114 (H) 05/06/2017 0948     Wt Readings from Last 3 Encounters:  09/20/18 153 lb 6.4 oz (69.6 kg)  06/16/18 157 lb (71.2 kg)  06/10/18 155 lb 6.4 oz (70.5 kg)      Other studies Reviewed: Additional studies/ records that were reviewed today include: TTE 01/13/18  Review of the above records today demonstrates:  - Left ventricle: The cavity size was normal. Wall thickness was   normal. Systolic function was vigorous. The estimated ejection   fraction was in the range of 65% to 70%. Wall motion was normal;   there were no regional wall motion abnormalities. Doppler   parameters are consistent with abnormal left ventricular   relaxation (grade 1 diastolic dysfunction). The E/e&' ratio is   between 8-15, suggesting indeterminate LV filling pressure. - Mitral valve: Mildly thickened leaflets . There was trivial   regurgitation. - Left atrium: The atrium was normal in size. - Inferior vena cava: The vessel was normal in size. The   respirophasic diameter changes were in the normal range (>= 50%),   consistent with normal central venous pressure.  LHC 01/12/18  The left ventricular systolic function is normal.  LV end diastolic pressure is normal.  The left ventricular  ejection fraction is 55-65% by visual estimate.   1. Normal coronary anatomy 2. Normal LV function 3. Normal LVEDP  30-day monitor 02/06/2018 personally reviewed Sinus rhythm, PVCs, nonsustained VT 14 beats  ETT 03/24/18 - personally reviewed  Blood pressure demonstrated a normal response to exercise.  There was no ST segment deviation noted during stress.  Clinically and electrically negative for ischemia.  Negative GXT  ASSESSMENT AND PLAN:  1.  Nonsustained VT: Had improvement in palpitations with Toprol-XL.  She is continued to have palpitations though and has taken extra doses with improvement in her symptoms.  We Kerissa Coia thus increase Toprol-XL to twice a day.  Should she continue to have palpitations, she may benefit from Rythmol.    2.  Hypertension: Currently well controlled  3.  Fibromuscular dysplasia: Being conservatively managed.  Current medicines are reviewed at length with the patient today.   The patient does not have concerns regarding her medicines.  The following changes were made today: Increase Toprol-XL  Labs/ tests ordered today include:  Orders Placed This Encounter  Procedures  . EKG 12-Lead   Disposition:   FU with Shatiqua Heroux 6 months  Signed, Shawnice Tilmon Jorja LoaMartin Abagael Kramm, MD  09/20/2018 8:39 AM     [facility] [address] [place] [address] [phone] (office) [phone] (fax)

## 2018-09-20 ENCOUNTER — Ambulatory Visit (INDEPENDENT_AMBULATORY_CARE_PROVIDER_SITE_OTHER): Payer: 59 | Admitting: Cardiology

## 2018-09-20 ENCOUNTER — Encounter: Payer: Self-pay | Admitting: Cardiology

## 2018-09-20 VITALS — BP 108/64 | HR 70 | Ht 62.0 in | Wt 153.4 lb

## 2018-09-20 DIAGNOSIS — I10 Essential (primary) hypertension: Secondary | ICD-10-CM | POA: Diagnosis not present

## 2018-09-20 DIAGNOSIS — I472 Ventricular tachycardia: Secondary | ICD-10-CM

## 2018-09-20 DIAGNOSIS — I4729 Other ventricular tachycardia: Secondary | ICD-10-CM

## 2018-09-20 DIAGNOSIS — I493 Ventricular premature depolarization: Secondary | ICD-10-CM

## 2018-09-20 MED ORDER — METOPROLOL SUCCINATE ER 50 MG PO TB24: 50.0000 mg | ORAL_TABLET | Freq: Every day | ORAL | 2 refills | Status: DC

## 2018-09-20 MED ORDER — METOPROLOL SUCCINATE ER 50 MG PO TB24: 50.0000 mg | ORAL_TABLET | Freq: Two times a day (BID) | ORAL | 2 refills | Status: DC

## 2018-09-20 NOTE — Patient Instructions (Signed)
Medication Instructions:  Your physician has recommended you make the following change in your medication:  1. INCREASE Toprol to 50 mg twice daily  If you need a refill on your cardiac medications before your next appointment, please call your pharmacy.   Lab work: None ordered  Testing/Procedures: None ordered  Follow-Up: At [facility], you and your health needs are our priority.  As part of our continuing mission to provide you with exceptional heart care, we have created designated Provider Care Teams.  These Care Teams include your primary Cardiologist (physician) and Advanced Practice Providers (APPs -  Physician Assistants and Nurse Practitioners) who all work together to provide you with the care you need, when you need it. You will need a follow up appointment in 6 months.  Please call our office 2 months in advance to schedule this appointment.  You may see Will Jorja LoaMartin Camnitz, MD or one of the following Advanced Practice Providers on your designated Care Team:   Gypsy BalsamAmber Seiler, NP . Francis Dowseenee Ursuy, PA-C  Thank you for choosing [facility]!!   Dory HornSherri Roney Youtz, RN [phone]

## 2018-09-20 NOTE — Addendum Note (Signed)
Addended by: Baird LyonsPRICE, Neriyah Cercone L on: 09/20/2018 08:42 AM   Modules accepted: Orders

## 2018-09-23 NOTE — Telephone Encounter (Signed)
Done

## 2018-09-30 ENCOUNTER — Other Ambulatory Visit: Payer: Self-pay | Admitting: Family Medicine

## 2018-10-03 ENCOUNTER — Encounter: Payer: Self-pay | Admitting: Family Medicine

## 2018-10-03 NOTE — Telephone Encounter (Signed)
Dr. Fry please advise. Thanks  

## 2018-10-20 ENCOUNTER — Ambulatory Visit: Payer: 59 | Admitting: Family Medicine

## 2018-10-20 ENCOUNTER — Encounter: Payer: Self-pay | Admitting: Family Medicine

## 2018-10-20 VITALS — BP 116/81 | HR 72 | Temp 98.3°F | Ht 62.0 in | Wt 160.8 lb

## 2018-10-20 DIAGNOSIS — J020 Streptococcal pharyngitis: Secondary | ICD-10-CM | POA: Diagnosis not present

## 2018-10-20 DIAGNOSIS — I773 Arterial fibromuscular dysplasia: Secondary | ICD-10-CM

## 2018-10-20 DIAGNOSIS — I701 Atherosclerosis of renal artery: Secondary | ICD-10-CM

## 2018-10-20 DIAGNOSIS — J029 Acute pharyngitis, unspecified: Secondary | ICD-10-CM | POA: Diagnosis not present

## 2018-10-20 DIAGNOSIS — R509 Fever, unspecified: Secondary | ICD-10-CM

## 2018-10-20 LAB — POCT RAPID STREP A (OFFICE): Rapid Strep A Screen: NEGATIVE

## 2018-10-20 LAB — POC INFLUENZA A&B (BINAX/QUICKVUE)
INFLUENZA A, POC: NEGATIVE
INFLUENZA B, POC: NEGATIVE

## 2018-10-20 MED ORDER — AZITHROMYCIN 250 MG PO TABS: ORAL_TABLET | ORAL | 0 refills | Status: DC

## 2018-10-20 NOTE — Progress Notes (Signed)
PCP: Nelwyn Salisbury, MD  Subjective:  Caroline Boone is a 53 y.o. year old very pleasant female patient who presents with  symptoms including sore throat, fatigue, fever.   She complains of headache across forehead, fever up to 102.9 last night, severe sore throat. Denies nausea. She had her flu shot this year. Symptoms started Tuesday and have been worsening. Almost no cough. Does note nasal congestion and drainage in throat.   -sick contacts/travel/risks: denies flu exposure. Positive Known strep exposure. Had grandson on Christmas and he had strep throat. Works in a [employer] and 2 employees had strep- [place] kidney associated. Has noted swollen lymph nodes. She worked Monday and Tuesday.   ROS-denies chest pain, SOB, NVD, tooth pain  Pertinent Past Medical History-  Patient Active Problem List   Diagnosis Date Noted  . NSVT (nonsustained ventricular tachycardia) (HCC) 01/12/2018  . Chest pain 01/12/2018  . ADHD 02/23/2017  . Low back pain 06/18/2015  . Depression 06/18/2015  . Pseudoaneurysm (HCC) 04/08/2012  . Fibromuscular dysplasia (HCC) 11/04/2011  . Renal artery stenosis due to fibromuscular dysplasia (HCC) 09/02/2011  . Acute cervical arteriopathy associated with fibromuscular dysplasia (HCC) 07/17/2011  . Aneurysm (HCC) 07/17/2011  . Shortness of breath 03/20/2011  . Fatigue 03/20/2011  . Bruit 03/20/2011  . Hyperlipidemia 03/20/2011  . CHEST PAIN 05/20/2010  . MORTON'S NEUROMA 12/27/2009  . ARTHRITIS 12/27/2009  . Palpitations 03/27/2009  . Anxiety state 04/17/2008  . Essential hypertension 04/17/2008  . GERD 04/17/2008  . INSOMNIA 04/17/2008  . HEADACHE 04/17/2008    Medications- reviewed  Current Outpatient Medications  Medication Sig Dispense Refill  . acetaminophen (TYLENOL) 500 MG tablet Take 1,000 mg by mouth every 6 (six) hours as needed for moderate pain.    Marland Kitchen ALPRAZolam (XANAX) 1 MG tablet TAKE 1 TABLET BY MOUTH THREE TIMES DAILY 270 tablet 1   . aspirin 81 MG tablet Take 81 mg by mouth daily.      . cloNIDine (CATAPRES) 0.1 MG tablet Take 0.1 mg by mouth as needed (for high BP; takes along with lisinopril as needed).     . cyclobenzaprine (FLEXERIL) 10 MG tablet take 1 tablet by mouth three times a day if needed for muscle spasm 90 tablet 5  . hydrochlorothiazide (HYDRODIURIL) 25 MG tablet Take 1 tablet by mouth daily.  6  . lisinopril (PRINIVIL,ZESTRIL) 20 MG tablet Take 20 mg by mouth at bedtime.     . metoprolol succinate (TOPROL-XL) 50 MG 24 hr tablet Take 1 tablet (50 mg total) by mouth 2 (two) times daily. Take with or immediately following a meal. 180 tablet 2  . phentermine 30 MG capsule TAKE ONE CAPSULE BY MOUTH EVERY DAY 30 capsule 5  . promethazine (PHENERGAN) 25 MG tablet TAKE 1 TABLET(25 MG) BY MOUTH EVERY 4 HOURS AS NEEDED FOR NAUSEA OR VOMITING 60 tablet 5  . zolpidem (AMBIEN) 10 MG tablet TAKE 1 TABLET BY MOUTH EVERY NIGHT AT BEDTIME AS NEEDED 30 tablet 5   No current facility-administered medications for this visit.     Objective: BP 116/81 (BP Location: Left Arm, Patient Position: Sitting, Cuff Size: Large)   Pulse 72   Temp 98.3 F (36.8 C) (Oral)   Ht 5\' 2"  (1.575 m)   Wt 160 lb 12.8 oz (72.9 kg)   LMP 06/14/2011   SpO2 99%   BMI 29.41 kg/m  Gen: NAD, appears fatigued HEENT: Turbinates erythematous, TM normal, pharynx markedly erythematous with noted tonsilar exudate. + tonsilar edema,  no sinus tenderness Tender cervical lymphadenopathy on the left and right CV: RRR no rubs or gallops Lungs: CTAB no crackles, wheeze, rhonchi Ext: no edema Skin: warm, dry, no rash  Results for orders placed or performed in visit on 10/20/18 (from the past 24 hour(s))  POCT rapid strep A     Status: None   Collection Time: 10/20/18  1:40 PM  Result Value Ref Range   Rapid Strep A Screen Negative Negative  POC Influenza A&B(BINAX/QUICKVUE)     Status: None   Collection Time: 10/20/18  1:40 PM  Result Value Ref  Range   Influenza A, POC Negative Negative   Influenza B, POC Negative Negative    Assessment/Plan:  Streptococcal pharyngitis History and exam today are suggestive of bacterial infection due to group A streptococcus.  Antibiotic: azithromycin for 5 days.  Penicillin allergy-with facial and throat swelling.  She states she has tolerated Keflex in the past but wanted to reserve that for failure of azithromycin Symptomatic treatment with: salt water gargles, can schedule tylenol 650 mg every 6 hours, sore throat lozenges   Test for strep was negative but she has swollen tonsils with purulence on them, tender lymphadenopathy, no cough, severe sore throat, and fever above 102 which points to strep. This could be a case of flu as well with false negative test but with severity of symptoms in throat and how it mimics prior strep throat we opted to treat for strep throat first.   Advised contagious until at least 24 hours fever free and that treatment usually effective within 24-48 hours  Finally, we reviewed reasons to return to care including if symptoms worsen or persist or new concerns arise-particularly worsening pain or fever curve. If not feeling better by next week, let Dr. Clent Ridges reevaluate you  Meds ordered this encounter  Medications  . azithromycin (ZITHROMAX) 250 MG tablet    Sig: Take 2 tabs on day 1, then 1 tab daily until finished    Dispense:  6 tablet    Refill:  0  High risk patient with history of fibromuscular dysplasia previously followed at [facility].  Vitals appear stable and no signs of cardiac distress.  Has renal artery stenosis due to fibromuscular dysplasia but no creatinine elevation.  Tana Conch, MD

## 2018-10-20 NOTE — Patient Instructions (Signed)
Streptococcal pharyngitis History and exam today are suggestive of bacterial infection due to group A streptococcus. Test for strep was negative but she has swollen tonsils with purulence on them, tender lymphadenopathy, no cough, severe sore throat, and fever above 102 which points to strep. This could be a case of flu as well with false negative test but with severity of symptoms in throat and how it mimics prior strep throat we opted to treat for strep throat first.   Antibiotic: azithromycin for 5 days Symptomatic treatment with: salt water gargles, can schedule tylenol 650 mg every 6 hours, sore throat lozenges  Advised contagious until at least 24 hours fever free and that treatment usually effective within 24-48 hours  Finally, we reviewed reasons to return to care including if symptoms worsen or persist or new concerns arise-particularly worsening pain or fever curve. If not feeling better by next week, let Dr. Clent Ridges reevaluate you  Meds ordered this encounter  Medications  . azithromycin (ZITHROMAX) 250 MG tablet    Sig: Take 2 tabs on day 1, then 1 tab daily until finished    Dispense:  6 tablet    Refill:  0

## 2018-11-28 ENCOUNTER — Other Ambulatory Visit: Payer: Self-pay | Admitting: Family Medicine

## 2018-11-30 NOTE — Telephone Encounter (Signed)
Last rx given on 8/27 #30 with 5 ref

## 2018-12-06 IMAGING — MR MR FOOT*R* W/O CM
5 series · 40 of 40 positions shown · non-contrast
Comparison: 11/07/2015

CLINICAL DATA: Medial ankle and dorsal hindfoot pain.

EXAM:
MRI OF THE RIGHT HINDFOOT WITHOUT CONTRAST
TECHNIQUE: Multiplanar, multisequence MR imaging of the right hindfoot was
performed. No intravenous contrast was administered.

[Series 3: T2 fat-sat · axial · 3.0mm · 0.56mm/px · z∈[-31,+69]mm · 8 of 27 slices shown (1 of 3)]
[im 1/27]
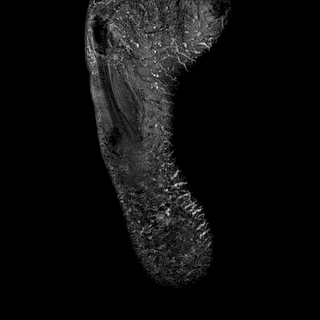
[im 4/27]
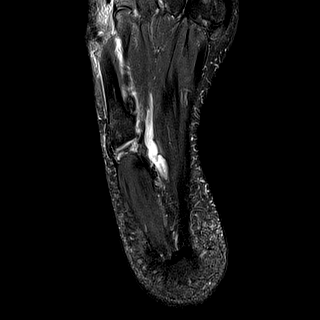
[im 8/27]
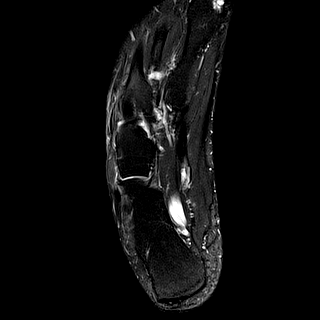
[im 12/27]
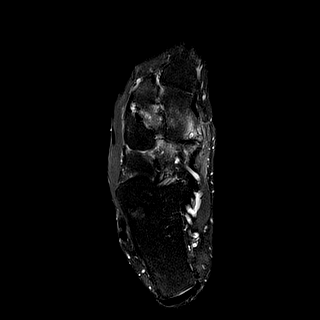
[im 15/27]
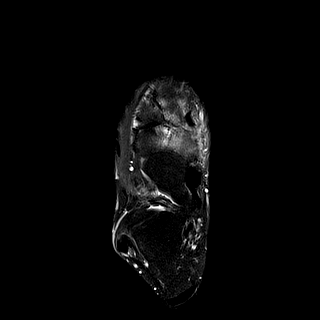
[im 19/27]
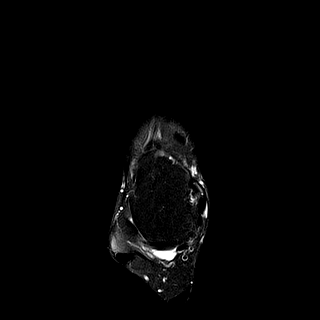
[im 23/27]
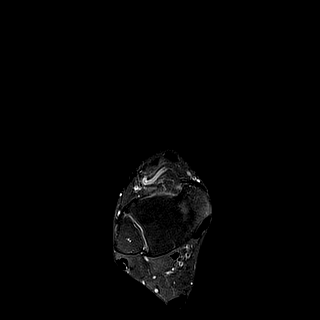
[im 27/27]
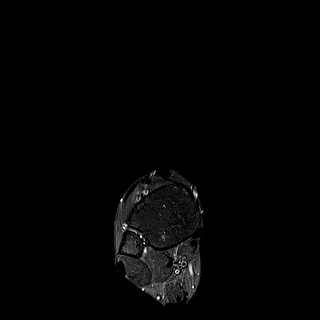

[Series 4: PD fat-sat · axial · 3.0mm · 0.56mm/px · z∈[-31,+69]mm · 8 of 27 slices shown]
[im 1/27]
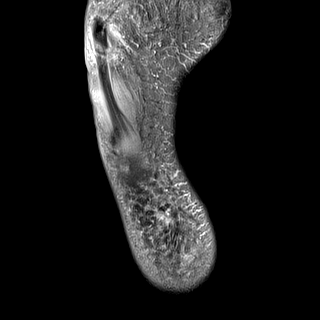
[im 4/27]
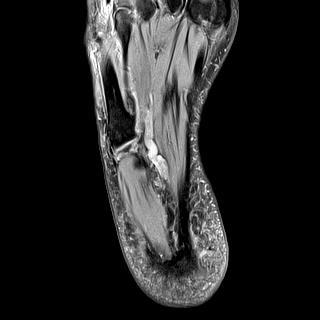
[im 8/27]
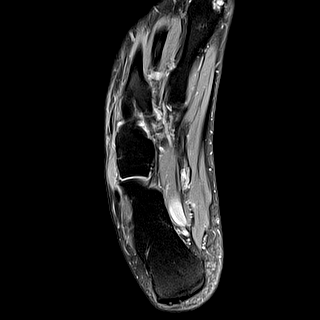
[im 12/27]
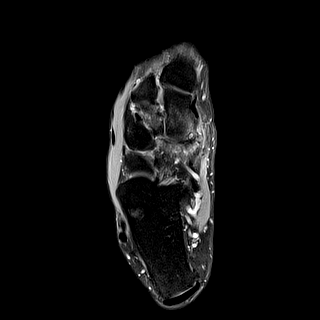
[im 15/27]
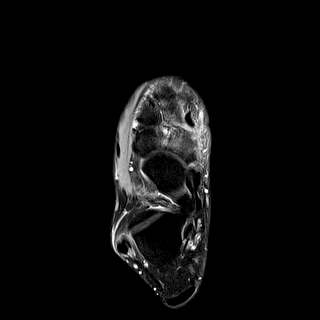
[im 19/27]
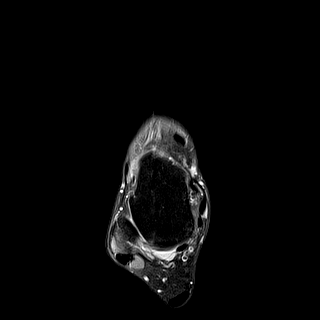
[im 23/27]
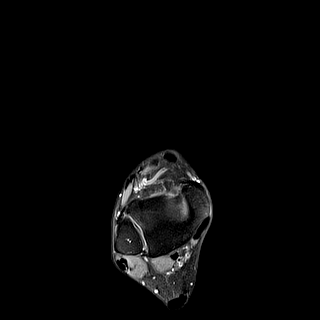
[im 27/27]
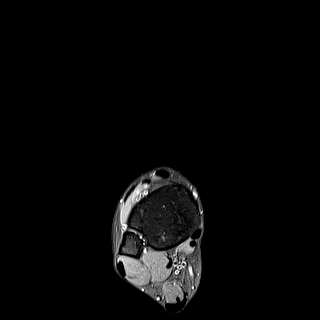

[Series 5: T1 · sagittal · 3.0mm · 0.56mm/px · 7 of 22 slices shown]
[im 1/22]
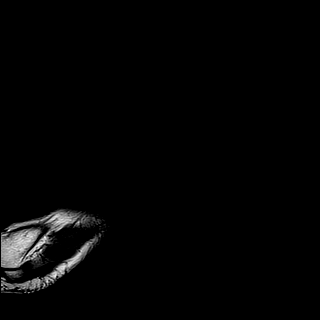
[im 4/22]
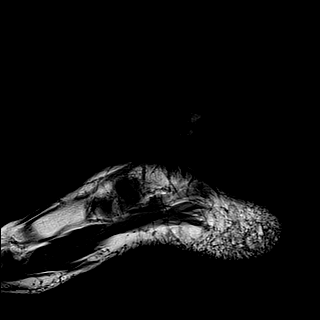
[im 8/22]
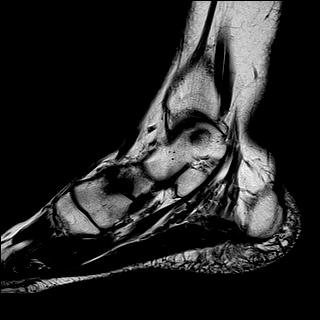
[im 11/22]
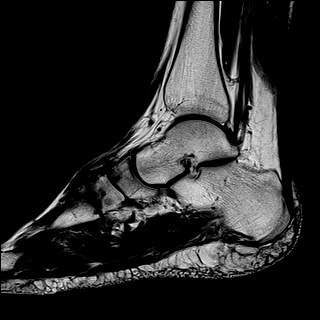
[im 15/22]
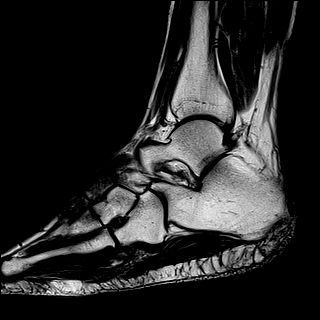
[im 18/22]
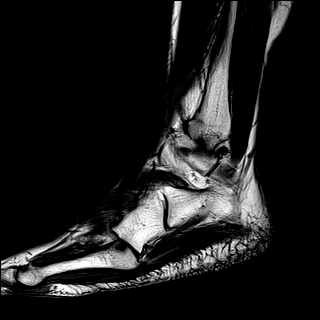
[im 22/22]
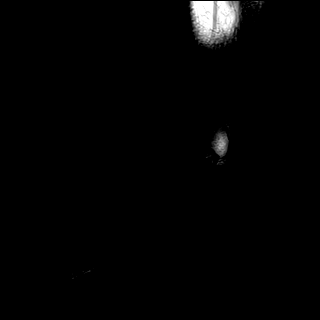

[Series 6: T2 fat-sat · sagittal · 3.0mm · 0.56mm/px · 7 of 22 slices shown (2 of 3)]
[im 1/22]
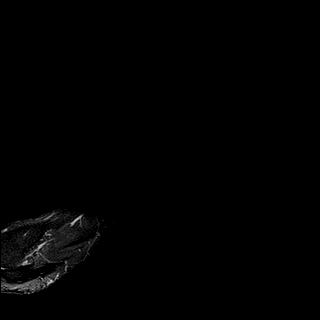
[im 4/22]
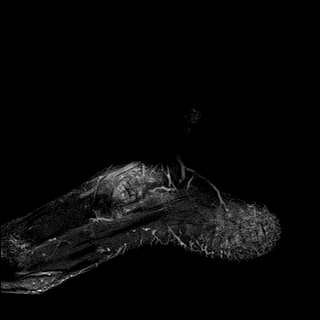
[im 8/22]
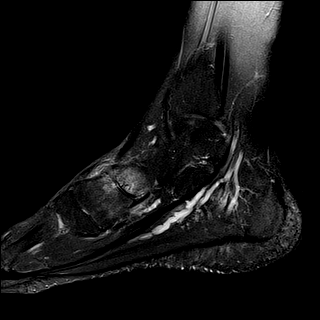
[im 11/22]
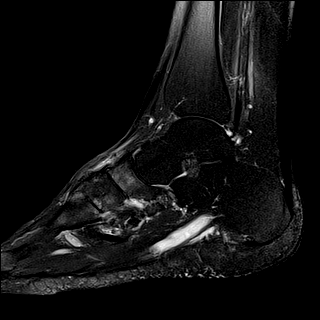
[im 15/22]
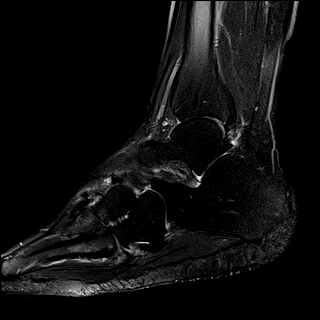
[im 18/22]
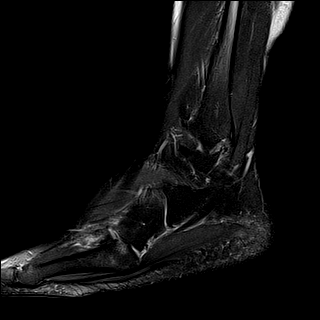
[im 22/22]
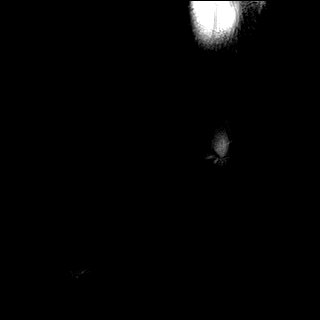

[Series 7: T2 fat-sat · coronal · 3.5mm · 0.56mm/px · 10 of 32 slices shown (3 of 3)]
[im 1/32]
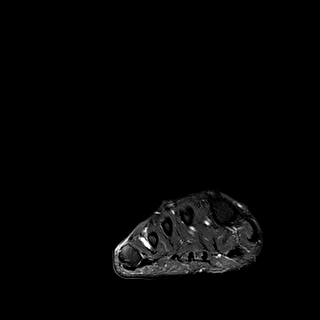
[im 4/32]
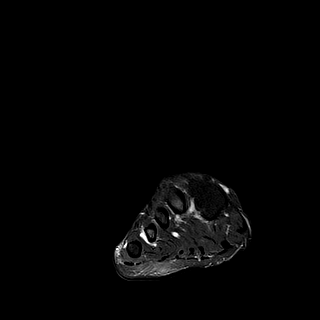
[im 7/32]
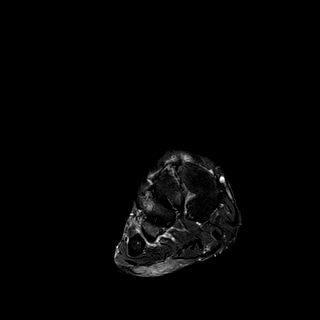
[im 11/32]
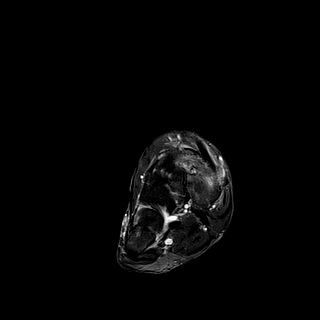
[im 14/32]
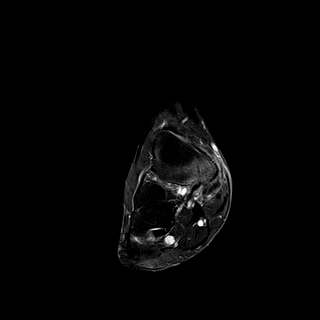
[im 18/32]
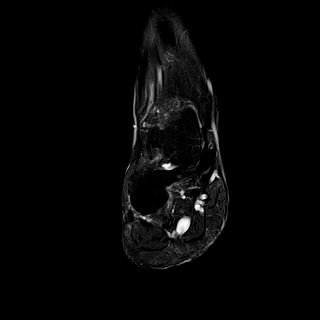
[im 21/32]
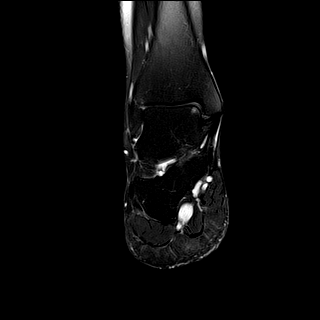
[im 25/32]
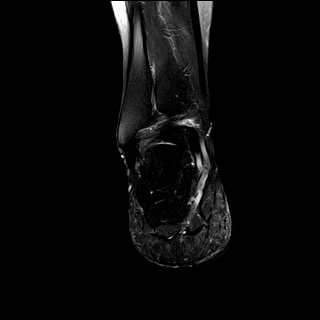
[im 28/32]
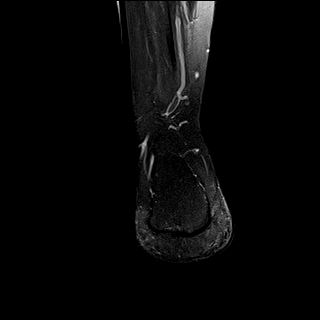
[im 32/32]
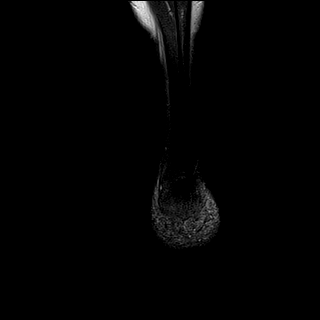

[40 of 40 positions shown; findings below may reference images not displayed]

FINDINGS: Bones/Joint/Cartilage

Small focus of subchondral edema possibly representing a
transchondral injury involving the medial talar dome is noted. This
area of edema measures 3 x 7 x 4 mm. No loose bony fragment is
noted. There is no ankle joint effusion. Inhomogeneous fat
suppression is believed to account for elevated marrow signal within
the distal fibula.

Joint space narrowing with reactive marrow edema and subchondral
degenerative cystic changes are noted about the navicular-cuneiform
joints consistent with stigmata of advanced osteoarthritis. Lesser
degree of reactive marrow edema, joint space narrowing and
subchondral cystic change is noted across the third through fifth
TMT articulations.

Ligaments

Intact

Muscles and Tendons

The anterior, medial and lateral tendons crossing the ankle joint
are of normal signal intensity and morphology. The Achilles tendon
is of normal signal intensity and morphology. The sinus tarsi is
normal in appearance. The plantar fascia is not thickened.

Soft tissues

No significant soft tissue edema or fluid collections.
IMPRESSION: 1. Midfoot osteoarthritis predominantly involving the
navicular-cuneiform articulations and base of third through fifth
TMT joints.
2. 3 x 7 x 4 mm subchondral marrow signal abnormality involving the
medial talar dome may represent a small transchondral
injury/osteochondritis dissecans.

## 2019-01-25 ENCOUNTER — Telehealth: Payer: Self-pay | Admitting: Family Medicine

## 2019-01-25 MED ORDER — HYDROCHLOROTHIAZIDE 25 MG PO TABS: 25.0000 mg | ORAL_TABLET | Freq: Every day | ORAL | 4 refills | Status: DC

## 2019-01-25 NOTE — Telephone Encounter (Signed)
Copied from CRM [id number]. Topic: Quick Communication - Rx Refill/Question >> Jan 25, 2019 11:36 AM Baldo Daub L wrote: Medication: hydrochlorothiazide (HYDRODIURIL) 25 MG tablet  Has the patient contacted their pharmacy? Yes - states that she needs new script (Agent: If no, request that the patient contact the pharmacy for the refill.) (Agent: If yes, when and what did the pharmacy advise?)  Preferred Pharmacy (with phone number or street name): [id number] - [place], [place] - [address] AT [facility] [address] [facility] [address] [phone] (Phone) [phone] (Fax)  Agent: Please be advised that RX refills may take up to 3 business days. We ask that you follow-up with your pharmacy.

## 2019-01-26 ENCOUNTER — Other Ambulatory Visit: Payer: Self-pay

## 2019-01-26 ENCOUNTER — Encounter: Payer: Self-pay | Admitting: Family Medicine

## 2019-01-26 ENCOUNTER — Ambulatory Visit (INDEPENDENT_AMBULATORY_CARE_PROVIDER_SITE_OTHER): Payer: 59 | Admitting: Family Medicine

## 2019-01-26 ENCOUNTER — Telehealth: Payer: Self-pay | Admitting: Family Medicine

## 2019-01-26 DIAGNOSIS — F1190 Opioid use, unspecified, uncomplicated: Secondary | ICD-10-CM

## 2019-01-26 DIAGNOSIS — M545 Low back pain, unspecified: Secondary | ICD-10-CM

## 2019-01-26 DIAGNOSIS — G8929 Other chronic pain: Secondary | ICD-10-CM

## 2019-01-26 NOTE — Progress Notes (Signed)
Subjective:    Patient ID: Caroline Boone, female    DOB: 06-16-1966, 53 y.o.   MRN: [id number]  HPI Virtual Visit via Video Note  I connected with the patient on 01/26/19 at 11:15 AM EDT by a video enabled telemedicine application and verified that I am speaking with the correct person using two identifiers.  Location patient: home Location provider:work or home office Persons participating in the virtual visit: patient, provider  I discussed the limitations of evaluation and management by telemedicine and the availability of in person appointments. The patient expressed understanding and agreed to proceed.   HPI: Here for pain management. She is doing well.  Indication for chronic opioid: low back pain Medication and dose: Norco 5-325 # pills per month: 120 Last UDS date: 10-26-17 Opioid Treatment Agreement signed (Y/N): 03-22-18 Opioid Treatment Agreement last reviewed with patient:  01-26-19 NCCSRS reviewed this encounter (include red flags):  01-26-19    ROS: See pertinent positives and negatives per HPI.  Past Medical History:  Diagnosis Date  . Anxiety   . Carotid artery injury   . Carotid stenosis    Dopplers 6/12:0-39% bilateral; distal ICAs with marked tortuosity; CT angiogram recommended;  [facility]; surgery to correct LICA stenosis and pseudoaneurysm too risky - > medical Rx;  dopplers 8/13: B/L 0-39% (stable)  . Cerebral aneurysm   . Chest pain    GXT echo 8/11: normal LVF, no ischemia;  h/o neg. myoview in 2005  . Family history of adverse reaction to anesthesia    MOMTHER HAD TROUBLE WAKING  . Fibromuscular dysplasia (HCC)    affects left vertebral,  artery, left ICA, left renal artery;  head and neck CTA 10/24/11: stable pattern of fibromuscular dysplasia of ICA and VA with distal changes suggesting prior pseudoaneurysm and dissection; L dist ICA 60%; normal MRI 10/29/11;  [facility]  . GERD (gastroesophageal reflux disease)   .  Headache   . History of cardiac cath 2007   cath 11/25/05 by Dr. Jenne CampusMcQueen with normal cors and EF 50%  . Hypertension    a. echo 7/11: mod LVH, EF 55-65%  . Insomnia   . Palpitations    hx  . 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id [facility]    Past Surgical History:  Procedure Laterality Date  . BREAST ENHANCEMENT SURGERY  1991   Bilateral breast   . COLONOSCOPY  04/27/2014   per Dr. Loreta AveMann, tubular adenomas, repeat in 10 yrs   . LEFT HEART CATH AND CORONARY ANGIOGRAPHY N/A 01/12/2018   Procedure: LEFT HEART CATH AND CORONARY ANGIOGRAPHY;  Surgeon: SwazilandJordan, Peter M, MD;  [facility];  Service: Cardiovascular;  Laterality: N/A;    Family History  Problem Relation Age of Onset  . Coronary artery disease Mother   . Kidney disease Mother   . Heart disease Mother 2870  . Heart attack Father 1071       CABG  . Kidney failure Father   . Hypertension Father   . Heart disease Father   . Arthritis Other        family hx  . Breast cancer Other        relative ,50  . Diabetes Other        family hx  . Hypertension Other        family hx  . Kidney disease Other  family hx  . Lung cancer Other        family hx     Current Outpatient Medications:  .  acetaminophen (TYLENOL) 500 MG tablet, Take 1,000 mg by mouth every 6 (six) hours as needed for moderate pain., Disp: , Rfl:  .  ALPRAZolam (XANAX) 1 MG tablet, TAKE 1 TABLET BY MOUTH THREE TIMES DAILY, Disp: 270 tablet, Rfl: 1 .  aspirin 81 MG tablet, Take 81 mg by mouth daily.  , Disp: , Rfl:  .  azithromycin (ZITHROMAX) 250 MG tablet, Take 2 tabs on day 1, then 1 tab daily until finished, Disp: 6 tablet, Rfl: 0 .  cloNIDine (CATAPRES) 0.1 MG tablet, Take 0.1 mg by mouth as needed (for high BP; takes along with lisinopril as needed). , Disp: , Rfl:  .  cyclobenzaprine (FLEXERIL) 10 MG tablet, take 1 tablet by mouth three times a day if needed for  muscle spasm, Disp: 90 tablet, Rfl: 5 .  hydrochlorothiazide (HYDRODIURIL) 25 MG tablet, Take 1 tablet (25 mg total) by mouth daily., Disp: 30 tablet, Rfl: 4 .  lisinopril (PRINIVIL,ZESTRIL) 20 MG tablet, Take 20 mg by mouth at bedtime. , Disp: , Rfl:  .  metoprolol succinate (TOPROL-XL) 50 MG 24 hr tablet, Take 1 tablet (50 mg total) by mouth 2 (two) times daily. Take with or immediately following a meal., Disp: 180 tablet, Rfl: 2 .  phentermine 30 MG capsule, TAKE ONE CAPSULE BY MOUTH EVERY DAY, Disp: 30 capsule, Rfl: 5 .  promethazine (PHENERGAN) 25 MG tablet, TAKE 1 TABLET(25 MG) BY MOUTH EVERY 4 HOURS AS NEEDED FOR NAUSEA OR VOMITING, Disp: 60 tablet, Rfl: 5 .  zolpidem (AMBIEN) 10 MG tablet, TAKE 1 TABLET BY MOUTH EVERY NIGHT AT BEDTIME AS NEEDED FOR SLEEP., Disp: 30 tablet, Rfl: 5  EXAM:  VITALS per patient if applicable:  GENERAL: alert, oriented, appears well and in no acute distress  HEENT: atraumatic, conjunttiva clear, no obvious abnormalities on inspection of external nose and ears  NECK: normal movements of the head and neck  LUNGS: on inspection no signs of respiratory distress, breathing rate appears normal, no obvious gross SOB, gasping or wheezing  CV: no obvious cyanosis  MS: moves all visible extremities without noticeable abnormality  PSYCH/NEURO: pleasant and cooperative, no obvious depression or anxiety, speech and thought processing grossly intact  ASSESSMENT AND PLAN: Pain management, meds were refilled.  Gershon Crane, MD  Discussed the following assessment and plan:  No diagnosis found.     I discussed the assessment and treatment plan with the patient. The patient was provided an opportunity to ask questions and all were answered. The patient agreed with the plan and demonstrated an understanding of the instructions.   The patient was advised to call back or seek an in-person evaluation if the symptoms worsen or if the condition fails to improve as  anticipated.     Review of Systems     Objective:   Physical Exam        Assessment & Plan:

## 2019-01-26 NOTE — Telephone Encounter (Signed)
Copied from CRM [id number]. Topic: Quick Communication - See Telephone Encounter >> Jan 26, 2019  5:46 PM Jens Som A wrote: CRM for notification. See Telephone encounter for: 01/26/19.  Patient is calling because she had a virtual visit today with Dr. Clent Ridges.  Her pain medications where not called into the pharmacy today. Patient called yesterday and hydrochlorothiazide (HYDRODIURIL) 25 MG tablet was called in. However, the patient did not want that medication refilled by Dr. Abran Cantor. It was refilled by Dr. Fabio Neighbors.  So at this time, the patient is requesting the pain medication from Dr. Abran Cantor HYDROcodone-acetaminophen [facility]) 5-325 MG tablet [[id number]]   Please advise [facility] #[id number] [place], [place] - [address] AT [facility] [address] [facility] [address] [phone] (Phone) [phone] (Fax)  Thank you.

## 2019-01-30 MED ORDER — HYDROCODONE-ACETAMINOPHEN 5-325 MG PO TABS: 1.0000 | ORAL_TABLET | Freq: Four times a day (QID) | ORAL | 0 refills | Status: DC | PRN

## 2019-01-30 MED ORDER — HYDROCODONE-ACETAMINOPHEN 5-325 MG PO TABS: 1.0000 | ORAL_TABLET | Freq: Four times a day (QID) | ORAL | 0 refills | Status: AC | PRN

## 2019-01-30 NOTE — Telephone Encounter (Signed)
Dr. Fry please advise. Thanks  

## 2019-01-30 NOTE — Telephone Encounter (Signed)
The Norco was sent in

## 2019-01-30 NOTE — Telephone Encounter (Signed)
Routed incorrectly  °

## 2019-02-24 DIAGNOSIS — I671 Cerebral aneurysm, nonruptured: Secondary | ICD-10-CM | POA: Diagnosis not present

## 2019-02-24 DIAGNOSIS — I773 Arterial fibromuscular dysplasia: Secondary | ICD-10-CM | POA: Diagnosis not present

## 2019-02-24 DIAGNOSIS — I10 Essential (primary) hypertension: Secondary | ICD-10-CM | POA: Diagnosis not present

## 2019-02-24 DIAGNOSIS — E785 Hyperlipidemia, unspecified: Secondary | ICD-10-CM | POA: Diagnosis not present

## 2019-03-03 ENCOUNTER — Telehealth: Payer: Self-pay | Admitting: Family Medicine

## 2019-03-03 ENCOUNTER — Encounter: Payer: Self-pay | Admitting: Family Medicine

## 2019-03-03 ENCOUNTER — Other Ambulatory Visit: Payer: Self-pay | Admitting: Family Medicine

## 2019-03-03 ENCOUNTER — Other Ambulatory Visit: Payer: Self-pay

## 2019-03-03 ENCOUNTER — Ambulatory Visit (INDEPENDENT_AMBULATORY_CARE_PROVIDER_SITE_OTHER): Payer: 59 | Admitting: Family Medicine

## 2019-03-03 DIAGNOSIS — H9201 Otalgia, right ear: Secondary | ICD-10-CM | POA: Diagnosis not present

## 2019-03-03 MED ORDER — DOXYCYCLINE HYCLATE 100 MG PO TABS: 100.0000 mg | ORAL_TABLET | Freq: Two times a day (BID) | ORAL | 0 refills | Status: AC

## 2019-03-03 NOTE — Progress Notes (Signed)
Subjective:    Patient ID: Caroline Boone, female    DOB: 22-Jan-1966, 53 y.o.   MRN: [id number]  HPI Virtual Visit via Video Note  I connected with the patient on 03/03/19 at  2:15 PM EDT by a video enabled telemedicine application and verified that I am speaking with the correct person using two identifiers.  Location patient: home Location provider:work or home office Persons participating in the virtual visit: patient, provider  I discussed the limitations of evaluation and management by telemedicine and the availability of in person appointments. The patient expressed understanding and agreed to proceed.   HPI: Here for one week of pain in the right ear. She has also had sinus congestion and PND. No fever or ST or cough. Using Allegra and Mucinex. She saw Dr. Darrick Penna last week for a renal follow up and he said he saw redness in the right ear. He gave her Neomycin drops but these have not helped.    ROS: See pertinent positives and negatives per HPI.  Past Medical History:  Diagnosis Date  . Anxiety   . Carotid artery injury   . Carotid stenosis    Dopplers 6/12:0-39% bilateral; distal ICAs with marked tortuosity; CT angiogram recommended;  followed at [facility]; surgery to correct LICA stenosis and pseudoaneurysm too risky - > medical Rx;  dopplers 8/13: B/L 0-39% (stable)  . Cerebral aneurysm   . Chest pain    GXT echo 8/11: normal LVF, no ischemia;  h/o neg. myoview in 2005  . Family history of adverse reaction to anesthesia    MOMTHER HAD TROUBLE WAKING  . Fibromuscular dysplasia (HCC)    affects left vertebral,  artery, left ICA, left renal artery;  head and neck CTA 10/24/11: stable pattern of fibromuscular dysplasia of ICA and VA with distal changes suggesting prior pseudoaneurysm and dissection; L dist ICA 60%; normal MRI 10/29/11;  follow[facility] at [facility] with serial CT scans  . GERD (gastroesophageal reflux disease)   . Headache   . History of cardiac cath 2007   cath 11/25/05 by  Dr. Jenne Campus with normal cors and EF 50%  . Hypertension    a. echo 7/11: mod LVH, EF 55-65%  . Insomnia   . Palpitations    hx  . 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 Prescott Parma  at [facility]    Past Surgical History:  Procedure Laterality Date  . BREAST ENHANCEMENT SURGERY  1991   Bilateral breast   . COLONOSCOPY  04/27/2014   per Dr. Loreta Ave, tubular adenomas, repeat in 10 yrs   . LEFT HEART CATH AND CORONARY ANGIOGRAPHY N/A 01/12/2018   Procedure: LEFT HEART CATH AND CORONARY ANGIOGRAPHY;  Surgeon: Swaziland, Peter M, MD;  Location: [facility] INVASIVE CV LAB;  Service: Cardiovascular;  Laterality: N/A;    Family History  Problem Relation Age of Onset  . Coronary artery disease Mother   . Kidney disease Mother   . Heart disease Mother 61  . Heart attack Father 49       CABG  . Kidney failure Father   . Hypertension Father   . Heart disease Father   . Arthritis Other        family hx  . Breast cancer Other        relative ,50  . Diabetes Other        family hx  . Hypertension Other        family hx  .  Kidney disease Other        family hx  . Lung cancer Other        family hx     Current Outpatient Medications:  .  acetaminophen (TYLENOL) 500 MG tablet, Take 1,000 mg by mouth every 6 (six) hours as needed for moderate pain., Disp: , Rfl:  .  ALPRAZolam (XANAX) 1 MG tablet, TAKE 1 TABLET BY MOUTH THREE TIMES DAILY, Disp: 270 tablet, Rfl: 1 .  aspirin 81 MG tablet, Take 81 mg by mouth daily.  , Disp: , Rfl:  .  cloNIDine (CATAPRES) 0.1 MG tablet, Take 0.1 mg by mouth as needed (for high BP; takes along with lisinopril as needed). , Disp: , Rfl:  .  cyclobenzaprine (FLEXERIL) 10 MG tablet, take 1 tablet by mouth three times a day if needed for muscle spasm, Disp: 90 tablet, Rfl: 5 .  hydrochlorothiazide (HYDRODIURIL) 25 MG tablet, Take 1 tablet (25 mg total) by mouth daily., Disp: 30 tablet, Rfl: 4 .  [START ON 04/01/2019]  HYDROcodone-acetaminophen (NORCO) 5-325 MG tablet, Take 1 tablet by mouth every 6 (six) hours as needed for up to 30 days for moderate pain., Disp: 120 tablet, Rfl: 0 .  lisinopril (PRINIVIL,ZESTRIL) 20 MG tablet, Take 20 mg by mouth at bedtime. , Disp: , Rfl:  .  metoprolol succinate (TOPROL-XL) 50 MG 24 hr tablet, Take 1 tablet (50 mg total) by mouth 2 (two) times daily. Take with or immediately following a meal., Disp: 180 tablet, Rfl: 2 .  neomycin-polymyxin-hydrocortisone (CORTISPORIN) OTIC solution, INSTILL 2 DROPS AD BID., Disp: , Rfl:  .  phentermine 30 MG capsule, TAKE ONE CAPSULE BY MOUTH EVERY DAY, Disp: 30 capsule, Rfl: 5 .  promethazine (PHENERGAN) 25 MG tablet, TAKE 1 TABLET(25 MG) BY MOUTH EVERY 4 HOURS AS NEEDED FOR NAUSEA OR VOMITING, Disp: 60 tablet, Rfl: 5 .  zolpidem (AMBIEN) 10 MG tablet, TAKE 1 TABLET BY MOUTH EVERY NIGHT AT BEDTIME AS NEEDED FOR SLEEP., Disp: 30 tablet, Rfl: 5  EXAM:  VITALS per patient if applicable:  GENERAL: alert, oriented, appears well and in no acute distress  HEENT: atraumatic, conjunttiva clear, no obvious abnormalities on inspection of external nose and ears  NECK: normal movements of the head and neck  LUNGS: on inspection no signs of respiratory distress, breathing rate appears normal, no obvious gross SOB, gasping or wheezing  CV: no obvious cyanosis  MS: moves all visible extremities without noticeable abnormality  PSYCH/NEURO: pleasant and cooperative, no obvious depression or anxiety, speech and thought processing grossly intact  ASSESSMENT AND PLAN: Ear pain which is either from an otitis media or a sinusitis, so we will treat with Doxycycline. Recheck prn.  Gershon CraneStephen Mckinnon Glick, MD  Discussed the following assessment and plan:  No diagnosis found.     I discussed the assessment and treatment plan with the patient. The patient was provided an opportunity to ask questions and all were answered. The patient agreed with the plan and  demonstrated an understanding of the instructions.   The patient was advised to call back or seek an in-person evaluation if the symptoms worsen or if the condition fails to improve as anticipated.     Review of Systems     Objective:   Physical Exam        Assessment & Plan:

## 2019-03-03 NOTE — Telephone Encounter (Signed)
Patient called and says she was seen in the office today and Dr. Clent Ridges was supposed to send her in something for her ear infection, she says she's at [facility] and nothing is there yet. I asked her to hold while I call the on call physician to see if something can be done. I called the on call physician, Dr. Salomon Fick, advised her of the above. She says she will send in the medication for the patient. The patient advised the medication will be sent in, she verbalized understanding.

## 2019-03-03 NOTE — Progress Notes (Signed)
On call provider contacted.  Pt called stating her rx for abx was not sent to pharmacy.  Pt seen earlier today via e-visit for ear pain 2/2 acute otitis media vs sinusitis.  Per note doxycycline to be taken.  Allergies reviewed.  Rx for doxycycline sent to pharmacy for pt.  Abbe Amsterdam, MD 6:06 pm 5/115/20

## 2019-03-20 ENCOUNTER — Other Ambulatory Visit: Payer: Self-pay | Admitting: Family Medicine

## 2019-03-21 ENCOUNTER — Ambulatory Visit: Payer: Self-pay

## 2019-03-21 NOTE — Telephone Encounter (Signed)
Patient called and says she had a virtual visit on 03/03/19 with Dr. Clent Ridges about her right ear pain. She says the pain is better, but she still has the echoing sound and feels like the ear is stopped up with pressure. She says she took doxycycline, but doesn't feel like it completely took care of the problem. She denies fever.  I advised the office is closed and someone will have to call back tomorrow with Dr. Claris Che recommendation, she verbalized understanding.  Answer Assessment - Initial Assessment Questions 1. MAIN CONCERN OR SYMPTOM:  "What is your main concern right now?" "What question do you have?" "What's the main symptom you're worried about?" (e.g., breathing difficulty, cough, fever. pain)     Right Ear echoing, feels like it's stopped up 2. ONSET: "When did the symptom start?"     3 weeks ago 3. BETTER-SAME-WORSE: "Are you getting better, staying the same, or getting worse compared to how you felt at your last visit to the doctor (most recent medical visit)"?     Pain feels better, but still uncomfortable with pressure 4. VISIT DATE: "When were you seen?" (Date)     03/03/19 5. VISIT DOCTOR: "What is the name of the doctor taking care of you now?"     Dr. Clent Ridges virtual visit 6. VISIT DIAGNOSIS:  "What was the main symptom or problem that you were seen for?" "Were you given a diagnosis?"      Ear pain 7. VISIT MEDICATIONS: "Did the physician order any new medicines for you to use?" If yes: "Have you filled the prescription and started taking the medicine?"      Doxycycline 8. NEXT APPOINTMENT: "Have you scheduled a follow-up appointment with your doctor?"     No 9. PAIN: "Is there any pain?" If so, ask: "How bad is it?"  (Scale 1-10; or mild, moderate, severe)     No 10. FEVER: "Do you have a fever?" If so, ask: "What is it, how was it measured  and when did it start?"       No 11. OTHER SYMPTOMS: "Do you have any other symptoms?"       No  Protocols used: RECENT MEDICAL VISIT FOR  ILLNESS FOLLOW-UP CALL-A-AH

## 2019-03-22 NOTE — Telephone Encounter (Signed)
Dr. Fry please advise. Thanks  

## 2019-03-22 NOTE — Telephone Encounter (Signed)
Set up an in person OV so I can look in her ear

## 2019-03-23 ENCOUNTER — Ambulatory Visit (INDEPENDENT_AMBULATORY_CARE_PROVIDER_SITE_OTHER): Payer: 59 | Admitting: Family Medicine

## 2019-03-23 ENCOUNTER — Other Ambulatory Visit: Payer: Self-pay

## 2019-03-23 ENCOUNTER — Encounter: Payer: Self-pay | Admitting: Family Medicine

## 2019-03-23 VITALS — BP 122/62 | HR 62 | Temp 98.8°F | Wt 161.2 lb

## 2019-03-23 DIAGNOSIS — H6121 Impacted cerumen, right ear: Secondary | ICD-10-CM

## 2019-03-23 NOTE — Telephone Encounter (Signed)
Call in #[phone] with one rf 

## 2019-03-23 NOTE — Progress Notes (Signed)
   Subjective:    Patient ID: Caroline Boone, female    DOB: 22-Feb-1966, 53 y.o.   MRN: [id number]  HPI Here for 3 weeks of pressure and mild pain in the right ear, along with muffled hearing. She is taking allegra and Mucinex D. She has used Neosporin drops in the ear and she has taken a course of Doxycycline, but these have not really helped. She has some sinus congestion and PND, no ST or cough or fever.    Review of Systems  Constitutional: Negative.   HENT: Positive for congestion, ear pain, hearing loss, postnasal drip and sinus pressure. Negative for ear discharge, sinus pain, sneezing and sore throat.   Eyes: Negative.   Respiratory: Negative.        Objective:   Physical Exam Constitutional:      General: She is not in acute distress.    Appearance: Normal appearance.  HENT:     Right Ear: External ear normal.     Left Ear: Tympanic membrane, ear canal and external ear normal.     Ears:     Comments: Right ear canal is full of cerumen     Nose: Nose normal.     Mouth/Throat:     Pharynx: Oropharynx is clear.  Eyes:     Conjunctiva/sclera: Conjunctivae normal.  Pulmonary:     Effort: Pulmonary effort is normal.     Breath sounds: Normal breath sounds.  Lymphadenopathy:     Cervical: No cervical adenopathy.  Neurological:     Mental Status: She is alert.           Assessment & Plan:  Cerumen impaction. We attempted to clear this with irrigation and manually with a speculum, but this caused her so much pain we had to stop. I will refer her to ENT to treat this.  Gershon Crane, MD

## 2019-03-23 NOTE — Telephone Encounter (Signed)
Dr. Fry please advise on refill. Thanks  

## 2019-03-29 NOTE — Telephone Encounter (Signed)
Refill called to the pharmacy with refills.

## 2019-04-16 ENCOUNTER — Other Ambulatory Visit: Payer: Self-pay | Admitting: Family Medicine

## 2019-04-18 NOTE — Telephone Encounter (Signed)
Dr. Fry please advise on refill. Thanks  

## 2019-04-27 ENCOUNTER — Other Ambulatory Visit: Payer: Self-pay | Admitting: Otolaryngology

## 2019-04-27 DIAGNOSIS — H9121 Sudden idiopathic hearing loss, right ear: Secondary | ICD-10-CM

## 2019-05-13 ENCOUNTER — Other Ambulatory Visit: Payer: Self-pay | Admitting: Family Medicine

## 2019-05-15 NOTE — Telephone Encounter (Signed)
Patient stated that she wanted to know if medication could be signed off on today since Dr. Sarajane Jews is out of office.

## 2019-05-15 NOTE — Telephone Encounter (Signed)
Last filled 12/01/2018 Last OV 03/23/2019  Ok to give temporary supply while Dr. Sarajane Jews is out of the office?

## 2019-05-16 NOTE — Telephone Encounter (Signed)
Refill x 1 in dr Sarajane Jews absence . Future rfills per PCP

## 2019-05-20 ENCOUNTER — Ambulatory Visit
Admission: RE | Admit: 2019-05-20 | Discharge: 2019-05-20 | Disposition: A | Discharge status: Home / Self Care | Payer: 59 | Source: Ambulatory Visit | Attending: Otolaryngology | Admitting: Otolaryngology

## 2019-05-20 ENCOUNTER — Other Ambulatory Visit: Payer: Self-pay

## 2019-05-20 DIAGNOSIS — H9121 Sudden idiopathic hearing loss, right ear: Secondary | ICD-10-CM

## 2019-05-20 MED ORDER — GADOBENATE DIMEGLUMINE 529 MG/ML IV SOLN
15.0000 mL | Freq: Once | INTRAVENOUS | Status: AC | PRN
  Administered 2019-05-20: 15:00:00 15 mL via INTRAVENOUS

## 2019-06-16 ENCOUNTER — Other Ambulatory Visit: Payer: Self-pay | Admitting: Internal Medicine

## 2019-06-19 ENCOUNTER — Telehealth: Payer: Self-pay | Admitting: Family Medicine

## 2019-06-19 NOTE — Telephone Encounter (Signed)
Pt stated pharmacy did not receive rx refill for Zolpidem on 06/16/19. Requesting it be resent

## 2019-06-19 NOTE — Telephone Encounter (Signed)
Please see below message.

## 2019-06-20 MED ORDER — ZOLPIDEM TARTRATE 10 MG PO TABS: 10.0000 mg | ORAL_TABLET | Freq: Every evening | ORAL | 5 refills | Status: DC | PRN

## 2019-06-20 NOTE — Addendum Note (Signed)
Addended by: Alysia Penna A on: 06/20/2019 07:55 AM   Modules accepted: Orders

## 2019-06-20 NOTE — Telephone Encounter (Signed)
Patient is aware 

## 2019-06-20 NOTE — Telephone Encounter (Signed)
Done

## 2019-07-26 ENCOUNTER — Encounter: Payer: Self-pay | Admitting: Family Medicine

## 2019-07-26 ENCOUNTER — Other Ambulatory Visit: Payer: Self-pay

## 2019-07-26 ENCOUNTER — Telehealth (INDEPENDENT_AMBULATORY_CARE_PROVIDER_SITE_OTHER): Payer: 59 | Admitting: Family Medicine

## 2019-07-26 DIAGNOSIS — H00011 Hordeolum externum right upper eyelid: Secondary | ICD-10-CM

## 2019-07-26 DIAGNOSIS — J019 Acute sinusitis, unspecified: Secondary | ICD-10-CM

## 2019-07-26 MED ORDER — HYDROCODONE-HOMATROPINE 5-1.5 MG/5ML PO SYRP: 5.0000 mL | ORAL_SOLUTION | ORAL | 0 refills | Status: DC | PRN

## 2019-07-26 MED ORDER — DOXYCYCLINE HYCLATE 100 MG PO CAPS: 100.0000 mg | ORAL_CAPSULE | Freq: Two times a day (BID) | ORAL | 0 refills | Status: AC

## 2019-07-26 NOTE — Progress Notes (Signed)
Virtual Visit via Telephone Note  I connected with the patient on 07/26/19 at  2:30 PM EDT by telephone and verified that I am speaking with the correct person using two identifiers. We attempted to connect virtually but we had technical difficulties with the audio and video.     I discussed the limitations, risks, security and privacy concerns of performing an evaluation and management service by telephone and the availability of in person appointments. I also discussed with the patient that there may be a patient responsible charge related to this service. The patient expressed understanding and agreed to proceed.  Location patient: home Location provider: work or home office Participants present for the call: patient, provider Patient did not have a visit in the prior 7 days to address this/these issue(s).   History of Present Illness: Here for 3 days of sinus congestion, pressure over the eyes, PND, and a dry cough. Thes are typical symptoms of a sinus infection for her. No fever or SOB or body aches or NVD. She is taking Mucinex and Tylenol Cold. Also for the past 24 hours the right upper eyelid has been swollen, red, and tender. The eye ball is clear. She has never had a stye before but her husband thinks she has one.    Observations/Objective: Patient sounds cheerful and well on the phone. I do not appreciate any SOB. Speech and thought processing are grossly intact. Patient reported vitals:  Assessment and Plan: She has a sinusitis and also likely has a stye. Treat with Doxycycline. Use hot compresses over the eyelid.  Alysia Penna, MD   Follow Up Instructions:     [phone] 5-10 [phone] 11-20 [phone] 21-30 I did not refer this patient for an OV in the next 24 hours for this/these issue(s).  I discussed the assessment and treatment plan with the patient. The patient was provided an opportunity to ask questions and all were answered. The patient agreed with the plan and demonstrated  an understanding of the instructions.   The patient was advised to call back or seek an in-person evaluation if the symptoms worsen or if the condition fails to improve as anticipated.  I provided 13 minutes of non-face-to-face time during this encounter.   Alysia Penna, MD

## 2019-08-04 ENCOUNTER — Other Ambulatory Visit: Payer: Self-pay

## 2019-08-04 ENCOUNTER — Encounter: Payer: Self-pay | Admitting: Family Medicine

## 2019-08-04 ENCOUNTER — Ambulatory Visit: Payer: 59 | Admitting: Family Medicine

## 2019-08-04 VITALS — BP 102/60 | HR 75 | Temp 97.7°F | Ht 62.0 in | Wt 157.2 lb

## 2019-08-04 DIAGNOSIS — F1190 Opioid use, unspecified, uncomplicated: Secondary | ICD-10-CM

## 2019-08-04 DIAGNOSIS — M545 Low back pain: Secondary | ICD-10-CM | POA: Diagnosis not present

## 2019-08-04 DIAGNOSIS — G8929 Other chronic pain: Secondary | ICD-10-CM | POA: Diagnosis not present

## 2019-08-04 MED ORDER — HYDROCODONE-ACETAMINOPHEN 5-325 MG PO TABS: 1.0000 | ORAL_TABLET | Freq: Four times a day (QID) | ORAL | 0 refills | Status: DC | PRN

## 2019-08-04 MED ORDER — CYCLOBENZAPRINE HCL 10 MG PO TABS: ORAL_TABLET | ORAL | 5 refills | Status: DC

## 2019-08-04 NOTE — Progress Notes (Signed)
   Subjective:    Patient ID: Caroline Boone, female    DOB: 1966-01-26, 53 y.o.   MRN: [id number]  HPI Here for pain management, she is doing well.  Indication for chronic opioid: low back pain Medication and dose: Norco 5-325 # pills per month: 120 Last UDS date: 10-26-17 Opioid Treatment Agreement signed (Y/N): 03-22-18 Opioid Treatment Agreement last reviewed with patient:  08-04-19 NCCSRS reviewed this encounter (include red flags): Yes   Review of Systems  Constitutional: Negative.   Respiratory: Negative.   Cardiovascular: Negative.   Musculoskeletal: Positive for back pain.       Objective:   Physical Exam Constitutional:      Appearance: Normal appearance.  Cardiovascular:     Rate and Rhythm: Normal rate and regular rhythm.     Pulses: Normal pulses.     Heart sounds: Normal heart sounds.  Pulmonary:     Effort: Pulmonary effort is normal.     Breath sounds: Normal breath sounds.  Neurological:     Mental Status: She is alert.           Assessment & Plan:  Pain management, meds were refilled.  Alysia Penna, MD

## 2019-08-11 ENCOUNTER — Ambulatory Visit: Payer: 59 | Admitting: Family Medicine

## 2019-08-24 ENCOUNTER — Ambulatory Visit (INDEPENDENT_AMBULATORY_CARE_PROVIDER_SITE_OTHER): Payer: 59

## 2019-08-24 ENCOUNTER — Other Ambulatory Visit: Payer: Self-pay

## 2019-08-24 ENCOUNTER — Ambulatory Visit (INDEPENDENT_AMBULATORY_CARE_PROVIDER_SITE_OTHER): Payer: 59 | Admitting: Family Medicine

## 2019-08-24 ENCOUNTER — Encounter: Payer: Self-pay | Admitting: Family Medicine

## 2019-08-24 VITALS — BP 128/82 | HR 60 | Temp 98.2°F | Ht 62.5 in | Wt 157.0 lb

## 2019-08-24 DIAGNOSIS — M542 Cervicalgia: Secondary | ICD-10-CM

## 2019-08-24 DIAGNOSIS — Z0000 Encounter for general adult medical examination without abnormal findings: Secondary | ICD-10-CM

## 2019-08-24 DIAGNOSIS — Z23 Encounter for immunization: Secondary | ICD-10-CM

## 2019-08-24 DIAGNOSIS — G8929 Other chronic pain: Secondary | ICD-10-CM

## 2019-08-24 DIAGNOSIS — F1190 Opioid use, unspecified, uncomplicated: Secondary | ICD-10-CM | POA: Diagnosis not present

## 2019-08-24 LAB — LIPID PANEL
Cholesterol: 210 mg/dL — ABNORMAL HIGH (ref 0–200)
HDL: 72.3 mg/dL (ref >39.00)
LDL Cholesterol: 126 mg/dL — ABNORMAL HIGH (ref 0–99)
NonHDL: 137.5
Total CHOL/HDL Ratio: 3
Triglycerides: 59 mg/dL (ref 0.0–149.0)
VLDL: 11.8 mg/dL (ref 0.0–40.0)

## 2019-08-24 LAB — CBC WITH DIFFERENTIAL/PLATELET
Basophils Absolute: 0 10*3/uL (ref 0.0–0.1)
Basophils Relative: 0.6 % (ref 0.0–3.0)
Eosinophils Absolute: 0.1 10*3/uL (ref 0.0–0.7)
Eosinophils Relative: 1.8 % (ref 0.0–5.0)
HCT: 39.2 % (ref 36.0–46.0)
Hemoglobin: 13.4 g/dL (ref 12.0–15.0)
Lymphocytes Relative: 38.9 % (ref 12.0–46.0)
Lymphs Abs: 2.2 10*3/uL (ref 0.7–4.0)
MCHC: 34.3 g/dL (ref 30.0–36.0)
MCV: 92.9 fl (ref 78.0–100.0)
Monocytes Absolute: 0.5 10*3/uL (ref 0.1–1.0)
Monocytes Relative: 9.5 % (ref 3.0–12.0)
Neutro Abs: 2.7 10*3/uL (ref 1.4–7.7)
Neutrophils Relative %: 49.2 % (ref 43.0–77.0)
Platelets: 285 10*3/uL (ref 150.0–400.0)
RBC: 4.22 Mil/uL (ref 3.87–5.11)
RDW: 12.5 % (ref 11.5–15.5)
WBC: 5.6 10*3/uL (ref 4.0–10.5)

## 2019-08-24 LAB — POC URINALSYSI DIPSTICK (AUTOMATED)
Glucose, UA: NEGATIVE
Leukocytes, UA: NEGATIVE
Protein, UA: NEGATIVE
Spec Grav, UA: 1.015 (ref 1.010–1.025)
Urobilinogen, UA: 0.2 E.U./dL
pH, UA: 7.5 (ref 5.0–8.0)

## 2019-08-24 LAB — BASIC METABOLIC PANEL
BUN: 7 mg/dL (ref 6–23)
CO2: 29 mEq/L (ref 19–32)
Calcium: 9.3 mg/dL (ref 8.4–10.5)
Chloride: 100 mEq/L (ref 96–112)
Creatinine, Ser: 0.62 mg/dL (ref 0.40–1.20)
GFR: 99.8 mL/min (ref >60.00)
Glucose, Bld: 85 mg/dL (ref 70–99)
Potassium: 4.5 mEq/L (ref 3.5–5.1)
Sodium: 135 mEq/L (ref 135–145)

## 2019-08-24 LAB — HEPATIC FUNCTION PANEL
ALT: 10 U/L (ref 0–35)
AST: 16 U/L (ref 0–37)
Albumin: 4.3 g/dL (ref 3.5–5.2)
Alkaline Phosphatase: 77 U/L (ref 39–117)
Bilirubin, Direct: 0.1 mg/dL (ref 0.0–0.3)
Total Bilirubin: 0.5 mg/dL (ref 0.2–1.2)
Total Protein: 6.7 g/dL (ref 6.0–8.3)

## 2019-08-24 LAB — TSH: TSH: 0.58 u[IU]/mL (ref 0.35–4.50)

## 2019-08-24 NOTE — Patient Instructions (Signed)
Health Maintenance Due  Topic Date Due  . Hepatitis C Screening  [birth year]  . TETANUS/TDAP  04/06/1983  . PAP SMEAR-Modifier  04/05/1985  . MAMMOGRAM  04/02/2018  . INFLUENZA VACCINE  05/20/2019    No flowsheet data found.

## 2019-08-24 NOTE — Progress Notes (Signed)
   Subjective:    Patient ID: Caroline Boone, female    DOB: January 23, 1966, 53 y.o.   MRN: [id number]  HPI Here for a well exam. Her only complaint is stiffness and pain in her neck. This started years ago. She hears it grind and pop at times.    Review of Systems  Constitutional: Negative.   HENT: Negative.   Eyes: Negative.   Respiratory: Negative.   Cardiovascular: Negative.   Gastrointestinal: Negative.   Genitourinary: Negative for decreased urine volume, difficulty urinating, dyspareunia, dysuria, enuresis, flank pain, frequency, hematuria, pelvic pain and urgency.  Musculoskeletal: Negative.   Skin: Negative.   Neurological: Negative.   Psychiatric/Behavioral: Negative.        Objective:   Physical Exam Constitutional:      General: She is not in acute distress.    Appearance: She is well-developed.  HENT:     Head: Normocephalic and atraumatic.     Right Ear: External ear normal.     Left Ear: External ear normal.     Nose: Nose normal.     Mouth/Throat:     Pharynx: No oropharyngeal exudate.  Eyes:     General: No scleral icterus.    Conjunctiva/sclera: Conjunctivae normal.     Pupils: Pupils are equal, round, and reactive to light.  Neck:     Musculoskeletal: Normal range of motion and neck supple.     Thyroid: No thyromegaly.     Vascular: No JVD.  Cardiovascular:     Rate and Rhythm: Normal rate and regular rhythm.     Heart sounds: Normal heart sounds. No murmur. No friction rub. No gallop.   Pulmonary:     Effort: Pulmonary effort is normal. No respiratory distress.     Breath sounds: Normal breath sounds. No wheezing or rales.  Chest:     Chest wall: No tenderness.  Abdominal:     General: Bowel sounds are normal. There is no distension.     Palpations: Abdomen is soft. There is no mass.     Tenderness: There is no abdominal tenderness. There is no guarding or rebound.  Musculoskeletal: Normal range of motion.        General: No tenderness.   Lymphadenopathy:     Cervical: No cervical adenopathy.  Skin:    General: Skin is warm and dry.     Findings: No erythema or rash.  Neurological:     Mental Status: She is alert and oriented to person, place, and time.     Cranial Nerves: No cranial nerve deficit.     Motor: No abnormal muscle tone.     Coordination: Coordination normal.     Deep Tendon Reflexes: Reflexes are normal and symmetric. Reflexes normal.  Psychiatric:        Behavior: Behavior normal.        Thought Content: Thought content normal.        Judgment: Judgment normal.           Assessment & Plan:  Well exam. We discussed diet and exercise. Get fasting labs. She likely has degenerative discs in her cervical spine, so we will get Xrays of this today. I reminded her that she is past due for a mammogram and for a colonoscopy.  Alysia Penna, MD

## 2019-08-26 LAB — PAIN MGMT, PROFILE 8 W/CONF, U
6 Acetylmorphine: NEGATIVE ng/mL
Alcohol Metabolites: NEGATIVE ng/mL (ref <500)
Alphahydroxyalprazolam: NEGATIVE ng/mL
Alphahydroxymidazolam: NEGATIVE ng/mL
Alphahydroxytriazolam: NEGATIVE ng/mL
Aminoclonazepam: NEGATIVE ng/mL
Amphetamines: NEGATIVE ng/mL
Benzodiazepines: NEGATIVE ng/mL
Buprenorphine, Urine: NEGATIVE ng/mL
Cocaine Metabolite: NEGATIVE ng/mL
Codeine: NEGATIVE ng/mL
Creatinine: 110.1 mg/dL
Hydrocodone: NEGATIVE ng/mL
Hydromorphone: NEGATIVE ng/mL
Hydroxyethylflurazepam: NEGATIVE ng/mL
Lorazepam: NEGATIVE ng/mL
MDMA: NEGATIVE ng/mL
Marijuana Metabolite: NEGATIVE ng/mL
Morphine: NEGATIVE ng/mL
Nordiazepam: NEGATIVE ng/mL
Norhydrocodone: 357 ng/mL
Opiates: POSITIVE ng/mL
Oxazepam: NEGATIVE ng/mL
Oxidant: NEGATIVE ug/mL
Oxycodone: NEGATIVE ng/mL
Temazepam: NEGATIVE ng/mL
pH: 7.5 (ref 4.5–9.0)

## 2019-08-29 ENCOUNTER — Telehealth: Payer: Self-pay | Admitting: *Deleted

## 2019-08-29 ENCOUNTER — Other Ambulatory Visit: Payer: Self-pay | Admitting: Obstetrics

## 2019-08-29 DIAGNOSIS — Z1231 Encounter for screening mammogram for malignant neoplasm of breast: Secondary | ICD-10-CM

## 2019-08-29 NOTE — Telephone Encounter (Signed)
Copied from [place] [id number]. Topic: General - Call Back - No Documentation >> Aug 29, 2019  3:42 PM Erick Blinks wrote: Reason for CRM: Requesting call back to discuss lab results Best contact: [phone]

## 2019-08-30 ENCOUNTER — Telehealth: Payer: Self-pay

## 2019-08-30 NOTE — Telephone Encounter (Signed)
Pt notified of lab results to follow a low fat diet due to elevated lipids.

## 2019-08-30 NOTE — Telephone Encounter (Signed)
Pt notified of update.  

## 2019-08-30 NOTE — Telephone Encounter (Signed)
Copied from [place] [id number]. Topic: General - Inquiry >> Aug 30, 2019  3:22 PM Caroline Boone wrote: Reason for CRM:   Pt calling to get results of neck x-ray done. Pt can be reached at [phone]

## 2019-09-25 ENCOUNTER — Encounter: Payer: Self-pay | Admitting: Family Medicine

## 2019-09-30 ENCOUNTER — Other Ambulatory Visit: Payer: Self-pay | Admitting: Cardiology

## 2019-10-23 ENCOUNTER — Ambulatory Visit: Payer: 59

## 2019-10-26 ENCOUNTER — Other Ambulatory Visit: Payer: Self-pay | Admitting: Family Medicine

## 2019-10-26 NOTE — Telephone Encounter (Signed)
Last filled 03/29/2019 Last OV 08/24/2019  Ok to fill?

## 2019-10-29 ENCOUNTER — Other Ambulatory Visit: Payer: Self-pay | Admitting: Cardiology

## 2019-10-30 ENCOUNTER — Other Ambulatory Visit: Payer: Self-pay | Admitting: Family Medicine

## 2019-10-30 NOTE — Telephone Encounter (Signed)
Last filled 04/18/2019 Last OV 08/24/2019  Ok to fill?

## 2019-11-17 ENCOUNTER — Other Ambulatory Visit: Payer: Self-pay | Admitting: Cardiology

## 2019-11-30 ENCOUNTER — Other Ambulatory Visit: Payer: Self-pay | Admitting: Family Medicine

## 2019-11-30 NOTE — Telephone Encounter (Signed)
Last filled 06/20/2019 Last OV 08/24/2019  Ok to fill?

## 2019-12-04 ENCOUNTER — Telehealth: Payer: Self-pay | Admitting: *Deleted

## 2019-12-04 MED ORDER — ZOLPIDEM TARTRATE 10 MG PO TABS: ORAL_TABLET | ORAL | 5 refills | Status: DC

## 2019-12-04 NOTE — Telephone Encounter (Signed)
Patient called after hours line. Patient reports she is calling to see if a refill request was sent to Dr. Clent Ridges for Ambien. She states she called in yesterday. Clinic RN notes the transmission failed for Ambien rx.

## 2019-12-04 NOTE — Telephone Encounter (Signed)
Transmission to pharmacy failed. Please re send.

## 2019-12-04 NOTE — Addendum Note (Signed)
Addended by: Gershon Crane A on: 12/04/2019 02:53 PM   Modules accepted: Orders

## 2019-12-04 NOTE — Telephone Encounter (Signed)
Patient is aware 

## 2019-12-04 NOTE — Telephone Encounter (Signed)
This was resent

## 2019-12-12 ENCOUNTER — Telehealth: Payer: 59 | Admitting: Family Medicine

## 2019-12-14 ENCOUNTER — Ambulatory Visit: Payer: 59 | Admitting: Family Medicine

## 2019-12-16 ENCOUNTER — Other Ambulatory Visit: Payer: Self-pay | Admitting: Cardiology

## 2020-01-06 ENCOUNTER — Ambulatory Visit: Payer: 59

## 2020-01-09 ENCOUNTER — Telehealth: Payer: Self-pay | Admitting: Family Medicine

## 2020-01-09 NOTE — Telephone Encounter (Signed)
Yes she should get tested asap regardless of her husband

## 2020-01-09 NOTE — Telephone Encounter (Signed)
Please advise 

## 2020-01-09 NOTE — Telephone Encounter (Signed)
Patient notified of update  and verbalized understanding. 

## 2020-01-09 NOTE — Telephone Encounter (Signed)
Pt's husband was having a low grade fever, headache, and aches plus chills. He went to get tested this past Friday on 3/19 but has not received his results back. Pt is wondering if she needs to get tested? She is not experiencing any symptoms but wants to know what her PCP thinks.   Pt can be reached at [phone]

## 2020-01-10 ENCOUNTER — Ambulatory Visit: Payer: 59 | Attending: Internal Medicine

## 2020-01-10 DIAGNOSIS — Z20822 Contact with and (suspected) exposure to covid-19: Secondary | ICD-10-CM

## 2020-01-11 LAB — SARS-COV-2, NAA 2 DAY TAT

## 2020-01-11 LAB — NOVEL CORONAVIRUS, NAA: SARS-CoV-2, NAA: NOT DETECTED

## 2020-01-12 ENCOUNTER — Other Ambulatory Visit: Payer: Self-pay | Admitting: Cardiology

## 2020-03-06 ENCOUNTER — Ambulatory Visit: Payer: 59 | Admitting: Family Medicine

## 2020-03-07 ENCOUNTER — Other Ambulatory Visit: Payer: Self-pay | Admitting: Cardiology

## 2020-03-08 ENCOUNTER — Other Ambulatory Visit: Payer: Self-pay

## 2020-03-08 ENCOUNTER — Ambulatory Visit (INDEPENDENT_AMBULATORY_CARE_PROVIDER_SITE_OTHER): Payer: 59 | Admitting: Family Medicine

## 2020-03-08 ENCOUNTER — Encounter: Payer: Self-pay | Admitting: Family Medicine

## 2020-03-08 VITALS — BP 110/60 | HR 104 | Temp 98.0°F | Wt 147.8 lb

## 2020-03-08 DIAGNOSIS — F1190 Opioid use, unspecified, uncomplicated: Secondary | ICD-10-CM

## 2020-03-08 DIAGNOSIS — M545 Low back pain: Secondary | ICD-10-CM

## 2020-03-08 DIAGNOSIS — G8929 Other chronic pain: Secondary | ICD-10-CM

## 2020-03-08 MED ORDER — HYDROCODONE-ACETAMINOPHEN 5-325 MG PO TABS: 1.0000 | ORAL_TABLET | Freq: Four times a day (QID) | ORAL | 0 refills | Status: DC | PRN

## 2020-03-08 MED ORDER — HYDROCODONE-ACETAMINOPHEN 5-325 MG PO TABS: 1.0000 | ORAL_TABLET | Freq: Four times a day (QID) | ORAL | 0 refills | Status: AC | PRN

## 2020-03-08 NOTE — Progress Notes (Signed)
   Subjective:    Patient ID: Caroline Boone, female    DOB: [birth year], 54 y.o.   MRN: [id number]  HPI Here for pain management, she is doing well.  Indication for chronic opioid: low back pain Medication and dose: Norco 5-325 # pills per month: 120 Last UDS date: 08-24-19 Opioid Treatment Agreement signed (Y/N): 03-22-18 Opioid Treatment Agreement last reviewed with patient:  03-08-20 NCCSRS reviewed this encounter (include red flags): Yes    Review of Systems     Objective:   Physical Exam        Assessment & Plan:  Pain management, meds were refilled.  Gershon Crane, MD

## 2020-05-09 ENCOUNTER — Other Ambulatory Visit: Payer: Self-pay | Admitting: Family Medicine

## 2020-05-09 NOTE — Progress Notes (Signed)
Cardiology Office Note Date:  05/10/2020  Patient ID:  Caroline Boone, Caroline Boone November 18, 1965, MRN [id number] PCP:  Nelwyn Salisbury, MD  Cardiologist:  Dr. Antoine Poche EP: Dr. Elberta Fortis    Chief Complaint: up tick in palpitations  History of Present Illness: Caroline Boone is a 54 y.o. female with history of carotid pseudoaneurysm and fibromuscular dysplasia (this followed routinely at [facility]).  She last saw Dr. Antoine Poche his note May 2019  reviewed, noted: "In 2013 she had a coronary CT angiogram which demonstrated a calcium score of 0 and no suggestion of coronary disease She was in the ED with chest pain and had a CT that was negative for PE.   The patient underwent cardiac catheterization on 01/12/2018 revealing normal coronary anatomy.  I did review the hospital records and note that she had a 14 beat run of nonsustained ventricular tachycardia in the hospital.  Since that time she has a Apple watch and she has had tachypalpitations.  She reports that he will be recording at 144 and she is not really noticing it.  It does not correlate necessarily with any symptoms and today she could not show me the rhythm strips." Her BB that was casing daytime fatigue was moved to PM, and referred to EP.   She comes in today to be seen for Dr. Elberta Fortis. Last seen by him Dec 2019.  She had been started on Flecainide though reported blurred vision, clamminess, and heartburn felt to be the flecainide and wanted to stop.  Was having ongoing palpitations as well.  She was maintained on Toprol that seemed to help her the best.  Discussed perhaps Propafenone if needed in the future  She called 05/02/20 with increased palpitation burden and once associated with near syncope, taking extra doses of her Toprol.  All in all since her last visit she has been well, infrequent or occasional brief palpitations, with some use of an additional Toprol.  A few weeks ago she started noting an increase in the frequency of her  palpitations and increasing need for additional Toprol dose.  Once at work she was seated at her desk and her heart "took off racing", she started to feel like her vision was dimming and though she was going to faint.  She sat as still as she could and eventually it stopped and she did not afant.  Duration is difficult, she was very scared and felt like a couple minutes, though she can not be sure. She was not SOB, no CP or other symptoms  She has been walking for the last couple months to help weight loss efforts 2-61miles most days without CP and feels like she has good exertional capacity until the above episode.  Since then she has felt fatigued, tired.  She was walking with her husband a few days ago and told him she needed to turn back, just felt tired and maybe a little SOB, though also mentions it was very, very hot out.  There has been no pattern or trigger to the palpitations that she has noted, has felt them day or evenings, home or work.  No recurrent near syncope with them. She was prescribed phentermine about May to help with weight loss efforts though only took it a couple times, made her feel "weird" and has not taken any more. She is eating healthier, denies any particular diet or extreme change in her eating patters.   She has had increased stress at work being very short handed the  last few months. No other clear life changes outside of a new puppy in May. She denies any excessive caffeine. She is 3 years without menses    AAD: Flecainide  Stopped shortly after starting unable to tolerate with side effects   Past Medical History:  Diagnosis Date   Anxiety    Carotid artery injury    Carotid stenosis    Dopplers 6/12:0-39% bilateral; distal ICAs with marked tortuosity; CT angiogram recommended;  followed at [facility]; surgery to correct LICA stenosis and pseudoaneurysm too risky - > medical Rx;  dopplers 8/13: B/L 0-39% (stable)   Cerebral aneurysm    Chest pain    GXT echo  8/11: normal LVF, no ischemia;  h/o neg. myoview in 2005   Family history of adverse reaction to anesthesia    MOMTHER HAD TROUBLE WAKING   Fibromuscular dysplasia (HCC)    affects left vertebral,  artery, left ICA, left renal artery;  head and neck CTA 10/24/11: stable pattern of fibromuscular dysplasia of ICA and VA with distal changes suggesting prior pseudoaneurysm and dissection; L dist ICA 60%; normal MRI 10/29/11;  followed at [facility] with serial CT scans   GERD (gastroesophageal reflux disease)    Headache    History of cardiac cath 2007   cath 11/25/05 by Dr. Jenne Campus with normal cors and EF 50%   Hypertension    a. 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 Prescott Parma  at [facility]    Past Surgical History:  Procedure Laterality Date   BREAST ENHANCEMENT SURGERY  1991   Bilateral breast    COLONOSCOPY  04/27/2014   per Dr. Loreta Ave, tubular adenomas, repeat in 5 yrs    LEFT HEART CATH AND CORONARY ANGIOGRAPHY N/A 01/12/2018   Procedure: LEFT HEART CATH AND CORONARY ANGIOGRAPHY;  Surgeon: Swaziland, Peter M, MD;  Location: [facility] INVASIVE CV LAB;  Service: Cardiovascular;  Laterality: N/A;    Current Outpatient Medications  Medication Sig Dispense Refill   acetaminophen (TYLENOL) 500 MG tablet Take 1,000 mg by mouth every 6 (six) hours as needed for moderate pain.     ALPRAZolam (XANAX) 1 MG tablet TAKE 1 TABLET BY MOUTH THREE TIMES DAILY 270 tablet 1   aspirin 81 MG tablet Take 81 mg by mouth daily.       cyclobenzaprine (FLEXERIL) 10 MG tablet take 1 tablet by mouth three times a day if needed for muscle spasm 90 tablet 5   hydrochlorothiazide (HYDRODIURIL) 25 MG tablet Take 1 tablet (25 mg total) by mouth daily. 30 tablet 4   HYDROcodone-acetaminophen (NORCO) 5-325 MG tablet Take 1 tablet by mouth every 6 (six) hours as needed for moderate pain. 120 tablet 0     lisinopril (ZESTRIL) 10 MG tablet Take 1 tablet (10 mg total) by mouth at bedtime. 90 tablet 1   metoprolol succinate (TOPROL-XL) 50 MG 24 hr tablet Take 3 tablets (150 mg total) by mouth daily. TAKE 50 MG IN AM AND 100 MG IN PM 270 tablet 1   neomycin-polymyxin-hydrocortisone (CORTISPORIN) OTIC solution INSTILL 2 DROPS AD BID.     promethazine (PHENERGAN) 25 MG tablet TAKE 1 TABLET(25 MG) BY MOUTH EVERY 4 HOURS AS NEEDED FOR NAUSEA OR VOMITING 60 tablet 5   zolpidem (AMBIEN) 10 MG tablet TAKE 1 TABLET(10 MG) BY MOUTH AT BEDTIME AS NEEDED FOR SLEEP 30  tablet 5   No current facility-administered medications for this visit.    Allergies:   Amoxicillin, Sulfamethoxazole, and Sulfonamide derivatives   Social History:  The patient  reports that she has never smoked. She has never used smokeless tobacco. She reports that she does not drink alcohol and does not use drugs.   Family History:  The patient's family history includes Arthritis in an other family member; Breast cancer in an other family member; Coronary artery disease in her mother; Diabetes in an other family member; Heart attack (age of onset: 6571) in her father; Heart disease in her father; Heart disease (age of onset: 4870) in her mother; Hypertension in her father and another family member; Kidney disease in her mother and another family member; Kidney failure in her father; Lung cancer in an other family member.  ROS:  Please see the history of present illness.   All other systems are reviewed and otherwise negative.   PHYSICAL EXAM:  VS:  BP 118/76    Pulse 82    Ht 5\' 2"  (1.575 m)    Wt 144 lb (65.3 kg)    LMP 06/14/2011    SpO2 98%    BMI 26.34 kg/m  BMI: Body mass index is 26.34 kg/m. Well nourished, well developed, healthy appearing in no acute distress  HEENT: normocephalic, atraumatic  Neck: no JVD, carotid bruits or masses Cardiac:  RRR; no significant murmurs, no rubs, or gallops Lungs:  CTA b/l, no wheezing, rhonchi  or rales  Abd: soft, nontender MS: no deformity or atrophy Ext: no edema  Skin: warm and dry, no rash Neuro:  No gross deficits appreciated Psych: euthymic mood, full affect   EKG:  Done today and reviewed by myself shows  SR 82bpm, unchanged from 2019   TTE 01/13/18  Review of the above records today demonstrates:  - Left ventricle: The cavity size was normal. Wall thickness was normal. Systolic function was vigorous. The estimated ejection fraction was in the range of 65% to 70%. Wall motion was normal; there were no regional wall motion abnormalities. Doppler parameters are consistent with abnormal left ventricular relaxation (grade 1 diastolic dysfunction). The E/e&' ratio is between 8-15, suggesting indeterminate LV filling pressure. - Mitral valve: Mildly thickened leaflets . There was trivial regurgitation. - Left atrium: The atrium was normal in size. - Inferior vena cava: The vessel was normal in size. The respirophasic diameter changes were in the normal range (>= 50%), consistent with normal central venous pressure.  LHC 01/12/18  The left ventricular systolic function is normal.  LV end diastolic pressure is normal.  The left ventricular ejection fraction is 55-65% by visual estimate.  1. Normal coronary anatomy 2. Normal LV function 3. Normal LVEDP  30-day monitor 02/06/2018 personally reviewed Sinus rhythm, PVCs, nonsustained VT 14 beats   Recent Labs: 08/24/2019: ALT 10; BUN 7; Creatinine, Ser 0.62; Hemoglobin 13.4; Platelets 285.0; Potassium 4.5; Sodium 135; TSH 0.58  08/24/2019: Cholesterol 210; HDL 72.30; LDL Cholesterol 126; Total CHOL/HDL Ratio 3; Triglycerides 59.0; VLDL 11.8   CrCl cannot be calculated (Patient's most recent lab result is older than the maximum 21 days allowed.).   Wt Readings from Last 3 Encounters:  05/10/20 144 lb (65.3 kg)  03/08/20 147 lb 12.8 oz (67 kg)  08/24/19 157 lb (71.2 kg)     Other studies  reviewed: Additional studies/records reviewed today include: summarized above  ASSESSMENT AND PLAN:  1. NSVT 2. Palpitations      She has had  a change in her palpitations and their behavior No symptoms of ischemic heart disease with normal coronaries by cath in 2019 BMET and mag today recommend updating her echo with c/o unusual fatigue (though she is using more Toprol as well) Recommend wearing a monitor to evaluate for any sustained VT, or other arrhythmias.  She is a bit reluctant worried she will be sent to the ER for her known NSVT, though is agreeable, will get 2 week Zio AT Increase her Toprol to 50AM and 100mg  PM Reduce her lisinopril in 1/2 to 10mg  daily Stop phentermine (she has not been using) Advised to stay well hydrated  I have cautioned her about driving.  She has not had syncope.  3. HTN     As above     Disposition: F/u with test results as we get them and in clinic in 4 weeks with Dr. , sooner if neeed  Current medicines are reviewed at length with the patient today.  The patient did not have any concerns regarding medicines.  , PA-C 05/10/2020 10:04 AM     [facility] [address] [place] [phone] (office)  [phone] (fax)

## 2020-05-10 ENCOUNTER — Telehealth: Payer: Self-pay | Admitting: Family Medicine

## 2020-05-10 ENCOUNTER — Ambulatory Visit: Payer: 59 | Admitting: Physician Assistant

## 2020-05-10 ENCOUNTER — Telehealth: Payer: Self-pay | Admitting: Radiology

## 2020-05-10 ENCOUNTER — Other Ambulatory Visit: Payer: Self-pay

## 2020-05-10 VITALS — BP 118/76 | HR 82 | Ht 62.0 in | Wt 144.0 lb

## 2020-05-10 DIAGNOSIS — I10 Essential (primary) hypertension: Secondary | ICD-10-CM

## 2020-05-10 DIAGNOSIS — R002 Palpitations: Secondary | ICD-10-CM

## 2020-05-10 DIAGNOSIS — R55 Syncope and collapse: Secondary | ICD-10-CM | POA: Diagnosis not present

## 2020-05-10 DIAGNOSIS — I4729 Other ventricular tachycardia: Secondary | ICD-10-CM

## 2020-05-10 DIAGNOSIS — I472 Ventricular tachycardia: Secondary | ICD-10-CM

## 2020-05-10 LAB — BASIC METABOLIC PANEL
BUN/Creatinine Ratio: 10 (ref 9–23)
BUN: 6 mg/dL (ref 6–24)
CO2: 25 mmol/L (ref 20–29)
Calcium: 9.5 mg/dL (ref 8.7–10.2)
Chloride: 95 mmol/L — ABNORMAL LOW (ref 96–106)
Creatinine, Ser: 0.63 mg/dL (ref 0.57–1.00)
GFR calc Af Amer: 116 mL/min/{1.73_m2} (ref >59)
GFR calc non Af Amer: 101 mL/min/{1.73_m2} (ref >59)
Glucose: 82 mg/dL (ref 65–99)
Potassium: 4.2 mmol/L (ref 3.5–5.2)
Sodium: 134 mmol/L (ref 134–144)

## 2020-05-10 LAB — MAGNESIUM: Magnesium: 2 mg/dL (ref 1.6–2.3)

## 2020-05-10 MED ORDER — LISINOPRIL 10 MG PO TABS: 10.0000 mg | ORAL_TABLET | Freq: Every day | ORAL | 1 refills | Status: DC

## 2020-05-10 MED ORDER — METOPROLOL SUCCINATE ER 50 MG PO TB24: 150.0000 mg | ORAL_TABLET | Freq: Every day | ORAL | 1 refills | Status: DC

## 2020-05-10 MED ORDER — ALPRAZOLAM 1 MG PO TABS: 1.0000 mg | ORAL_TABLET | Freq: Three times a day (TID) | ORAL | 1 refills | Status: DC

## 2020-05-10 NOTE — Telephone Encounter (Signed)
Enrolled patient for a 14 day Zio AT monitor to be mailed to patients home.  

## 2020-05-10 NOTE — Telephone Encounter (Signed)
Pt call and need a refill on ALPRAZolam (XANAX) 1 MG tablet and zolpidem (AMBIEN) 10 MG tablet sent to [id number] - [place], [place] - [address] AT [facility] [address] [facility] [address] Phone:  [phone]  Fax:  [phone]     pt stated she will be going out of town and would like to have it before Sunday.

## 2020-05-10 NOTE — Telephone Encounter (Signed)
Done

## 2020-05-10 NOTE — Patient Instructions (Addendum)
Medication Instructions:   STOP TAKING:    PHENTERMINE 30 MG   START TAKING :   METOPROLOL 50 MG AM AND 100 MG IN THE PM    LISINOPRIL 10 MG ONCE A DAY   *If you need a refill on your cardiac medications before your next appointment, please call your pharmacy*   Lab Work: BMET AND MAG TODAY   If you have labs (blood work) drawn today and your tests are completely normal, you will receive your results only by: Marland Kitchen MyChart Message (if you have MyChart) OR . A paper copy in the mail If you have any lab test that is abnormal or we need to change your treatment, we will call you to review the results.   Testing/Procedures: Your physician has requested that you have an echocardiogram. Echocardiography is a painless test that uses sound waves to create images of your heart. It provides your doctor with information about the size and shape of your heart and how well your heart's chambers and valves are working. This procedure takes approximately one hour. There are no restrictions for this procedure.   Your physician has recommended that you wear an event monitor. Event monitors are medical devices that record the heart's electrical activity. Doctors most often [place] these monitors to diagnose arrhythmias. Arrhythmias are problems with the speed or rhythm of the heartbeat. The monitor is a small, portable device. You can wear one while you do your normal daily activities. This is usually used to diagnose what is causing palpitations/syncope (passing out).    Follow-Up: At [facility], you and your health needs are our priority.  As part of our continuing mission to provide you with exceptional heart care, we have created designated Provider Care Teams.  These Care Teams include your primary Cardiologist (physician) and Advanced Practice Providers (APPs -  Physician Assistants and Nurse Practitioners) who all work together to provide you with the care you need, when you need it.  We recommend  signing up for the patient portal called "MyChart".  Sign up information is provided on this After Visit Summary.  MyChart is used to connect with patients for Virtual Visits (Telemedicine).  Patients are able to view lab/test results, encounter notes, upcoming appointments, etc.  Non-urgent messages can be sent to your provider as well.   To learn more about what you can do with MyChart, go to [email or web address].    Your next appointment:   4 week(s)  The format for your next appointment:   In Person  Provider:   You may see Will Jorja Loa, MD    Other Instructions  Christena Deem- Long Term Monitor Instructions   Your physician has requested you wear your ZIO patch monitor__14_____days.   This is a single patch monitor.  [employer] supplies one patch monitor per enrollment.  Additional stickers are not available.   Please do not apply patch if you will be having a Nuclear Stress Test, Echocardiogram, Cardiac CT, MRI, or Chest Xray during the time frame you would be wearing the monitor. The patch cannot be worn during these tests.  You cannot remove and re-apply the ZIO XT patch monitor.   Your ZIO patch monitor will be sent USPS Priority mail from [facility] directly to your home address. The monitor may also be mailed to a PO BOX if home delivery is not available.   It may take 3-5 days to receive your monitor after you have been enrolled.   Once you have received you  monitor, please review enclosed instructions.  Your monitor has already been registered assigning a specific monitor serial # to you.   Applying the monitor   Shave hair from upper left chest.   Hold abrader disc by orange tab.  Rub abrader in 40 strokes over left upper chest as indicated in your monitor instructions.   Clean area with 4 enclosed alcohol pads .  Use all pads to assure are is cleaned thoroughly.  Let dry.   Apply patch as indicated in monitor instructions.  Patch will be place under  collarbone on left side of chest with arrow pointing upward.   Rub patch adhesive wings for 2 minutes.Remove white label marked "1".  Remove white label marked "2".  Rub patch adhesive wings for 2 additional minutes.   While looking in a mirror, press and release button in center of patch.  A small green light will flash 3-4 times .  This will be your only indicator the monitor has been turned on.     Do not shower for the first 24 hours.  You may shower after the first 24 hours.   Press button if you feel a symptom. You will hear a small click.  Record Date, Time and Symptom in the Patient Log Book.   When you are ready to remove patch, follow instructions on last 2 pages of Patient Log Book.  Stick patch monitor onto last page of Patient Log Book.   Place Patient Log Book in [place] box.  Use locking tab on box and tape box closed securely.  The [employer] and [employer] has [employer] on it.  Please place in mailbox as soon as possible.  Your physician should have your test results approximately 7 days after the monitor has been mailed back to [facility].   Call [facility] Customer Care at [phone] if you have questions regarding your ZIO XT patch monitor.  Call them immediately if you see an orange light blinking on your monitor.   If your monitor falls off in less than 4 days contact our Monitor department at [phone].  If your monitor becomes loose or falls off after 4 days call [employer] at [phone] for suggestions on securing your monitor.

## 2020-05-10 NOTE — Telephone Encounter (Signed)
Okay for refill? Please advise 

## 2020-05-13 NOTE — Telephone Encounter (Signed)
Last filled 05/10/2020 (xanxa) 12/04/2019 (zolpidem)  Last OV 03/08/2020  Ok to fill?

## 2020-05-15 ENCOUNTER — Telehealth: Payer: Self-pay | Admitting: Cardiology

## 2020-05-15 NOTE — Telephone Encounter (Signed)
Lurena Joiner from [employer] called to advise our office that they have called the patient x3 to do an "AT Welcome Call" to no avail. Reference #: [id number].

## 2020-05-16 ENCOUNTER — Telehealth: Payer: Self-pay | Admitting: *Deleted

## 2020-05-16 NOTE — Telephone Encounter (Signed)
[employer] has been attempted to contact you regarding starting your ZIO AT monitor.   Please call them at [phone].

## 2020-05-23 ENCOUNTER — Encounter (INDEPENDENT_AMBULATORY_CARE_PROVIDER_SITE_OTHER): Payer: No Typology Code available for payment source

## 2020-05-23 DIAGNOSIS — R55 Syncope and collapse: Secondary | ICD-10-CM

## 2020-05-23 DIAGNOSIS — R002 Palpitations: Secondary | ICD-10-CM

## 2020-05-24 ENCOUNTER — Ambulatory Visit (HOSPITAL_COMMUNITY): Payer: No Typology Code available for payment source | Attending: Cardiovascular Disease

## 2020-05-24 ENCOUNTER — Other Ambulatory Visit: Payer: Self-pay

## 2020-05-24 DIAGNOSIS — R55 Syncope and collapse: Secondary | ICD-10-CM | POA: Insufficient documentation

## 2020-05-24 DIAGNOSIS — R002 Palpitations: Secondary | ICD-10-CM | POA: Diagnosis present

## 2020-05-24 LAB — ECHOCARDIOGRAM COMPLETE
Area-P 1/2: 3.91 cm2
S' Lateral: 2.2 cm

## 2020-06-05 ENCOUNTER — Other Ambulatory Visit: Payer: Self-pay | Admitting: Family Medicine

## 2020-06-06 NOTE — Telephone Encounter (Signed)
Last filled 10/04/2018 Last OV 03/08/2020  Ok to fill?

## 2020-06-13 ENCOUNTER — Ambulatory Visit: Payer: Self-pay | Admitting: Cardiology

## 2020-07-01 NOTE — Progress Notes (Signed)
PCP:  Nelwyn Salisbury, MD Primary Cardiologist: Rollene Rotunda, MD Electrophysiologist: Will Jorja Loa, MD   Caroline Boone is a 54 y.o. female seen today for Will Jorja Loa, MD for routine electrophysiology followup.  Since last being seen in our clinic the patient reports doing about the same. She is taking is toprol 2 in the am, and 1 in at night. She occasional skips the evening dose if her BP is low. She has the palpitations several times a week. .  she denies chest pain, palpitations, dyspnea, PND, orthopnea, nausea, vomiting, dizziness, syncope, edema, weight gain, or early satiety.  AAD: Flecainide  Stopped shortly after starting unable to tolerate with side effects  Past Medical History:  Diagnosis Date  . Anxiety   . Carotid artery injury   . Carotid stenosis    Dopplers 6/12:0-39% bilateral; distal ICAs with marked tortuosity; CT angiogram recommended;  followed at [facility]; surgery to correct LICA stenosis and pseudoaneurysm too risky - > medical Rx;  dopplers 8/13: B/L 0-39% (stable)  . Cerebral aneurysm   . Chest pain    GXT echo 8/11: normal LVF, no ischemia;  h/o neg. myoview in 2005  . Family history of adverse reaction to anesthesia    MOMTHER HAD TROUBLE WAKING  . Fibromuscular dysplasia (HCC)    affects left vertebral,  artery, left ICA, left renal artery;  head and neck CTA 10/24/11: stable pattern of fibromuscular dysplasia of ICA and VA with distal changes suggesting prior pseudoaneurysm and dissection; L dist ICA 60%; normal MRI 10/29/11;  followed at [facility] with serial CT scans  . GERD (gastroesophageal reflux disease)   . Headache   . History of cardiac cath 2007   cath 11/25/05 by Dr. Jenne Campus with normal cors and EF 50%  . Hypertension    a. echo 7/11: mod LVH, EF 55-65%  . Insomnia   . Palpitations    hx  . 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 Prescott Parma  at [facility]   Past Surgical History:    Procedure Laterality Date  . BREAST ENHANCEMENT SURGERY  1991   Bilateral breast   . COLONOSCOPY  04/27/2014   per Dr. Loreta Ave, tubular adenomas, repeat in 5 yrs   . LEFT HEART CATH AND CORONARY ANGIOGRAPHY N/A 01/12/2018   Procedure: LEFT HEART CATH AND CORONARY ANGIOGRAPHY;  Surgeon: Swaziland, Peter M, MD;  Location: [facility] INVASIVE CV LAB;  Service: Cardiovascular;  Laterality: N/A;    Current Outpatient Medications  Medication Sig Dispense Refill  . acetaminophen (TYLENOL) 500 MG tablet Take 1,000 mg by mouth every 6 (six) hours as needed for moderate pain.    Marland Kitchen ALPRAZolam (XANAX) 1 MG tablet Take 1 mg by mouth at bedtime as needed for anxiety.    Marland Kitchen aspirin 81 MG tablet Take 81 mg by mouth daily.      . cyclobenzaprine (FLEXERIL) 10 MG tablet take 1 tablet by mouth three times a day if needed for muscle spasm 90 tablet 5  . hydrochlorothiazide (HYDRODIURIL) 25 MG tablet Take 1 tablet (25 mg total) by mouth daily. 30 tablet 4  . lisinopril (ZESTRIL) 20 MG tablet Take 20 mg by mouth daily.    . metoprolol succinate (TOPROL-XL) 50 MG 24 hr tablet Take 3 tablets (150 mg total) by mouth daily. TAKE 50 MG IN AM AND 100 MG IN PM (Patient taking differently: Take 50 mg by mouth in the morning and at  bedtime. TAKE 50 MG IN AM AND 100 MG IN PM) 270 tablet 1  . promethazine (PHENERGAN) 25 MG tablet TAKE 1 TABLET(25 MG) BY MOUTH EVERY 4 HOURS AS NEEDED FOR NAUSEA OR VOMITING 60 tablet 5  . zolpidem (AMBIEN) 10 MG tablet TAKE 1 TABLET(10 MG) BY MOUTH AT BEDTIME AS NEEDED FOR SLEEP 30 tablet 5   No current facility-administered medications for this visit.    Allergies  Allergen Reactions  . Amoxicillin Nausea And Vomiting    Has patient had a PCN reaction causing immediate rash, facial/tongue/throat swelling, SOB or lightheadedness with hypotension: Yes Has patient had a PCN reaction causing severe rash involving mucus membranes or skin necrosis: No Has patient had a PCN reaction that required  hospitalization: No Has patient had a PCN reaction occurring within the last 10 years: No If all of the above answers are "NO", then may proceed with Cephalosporin use.   . Sulfamethoxazole Hives  . Sulfonamide Derivatives Hives    Social History   Socioeconomic History  . Marital status: Divorced    Spouse name: Not on file  . Number of children: Not on file  . Years of education: Not on file  . Highest education level: Not on file  Occupational History  . Not on file  Tobacco Use  . Smoking status: Never Smoker  . Smokeless tobacco: Never Used  Vaping Use  . Vaping Use: Never used  Substance and Sexual Activity  . Alcohol use: No    Alcohol/week: 0.0 standard drinks  . Drug use: No  . Sexual activity: Yes    Birth control/protection: None  Other Topics Concern  . Not on file  Social History Narrative  . Not on file   Social Determinants of Health   Financial Resource Strain:   . Difficulty of Paying Living Expenses: Not on file  Food Insecurity:   . Worried About Programme researcher, broadcasting/film/video in the Last Year: Not on file  . Ran Out of Food in the Last Year: Not on file  Transportation Needs:   . Lack of Transportation (Medical): Not on file  . Lack of Transportation (Non-Medical): Not on file  Physical Activity:   . Days of Exercise per Week: Not on file  . Minutes of Exercise per Session: Not on file  Stress:   . Feeling of Stress : Not on file  Social Connections:   . Frequency of Communication with Friends and Family: Not on file  . Frequency of Social Gatherings with Friends and Family: Not on file  . Attends Religious Services: Not on file  . Active Member of Clubs or Organizations: Not on file  . Attends Banker Meetings: Not on file  . Marital Status: Not on file  Intimate Partner Violence:   . Fear of Current or Ex-Partner: Not on file  . Emotionally Abused: Not on file  . Physically Abused: Not on file  . Sexually Abused: Not on file      Review of Systems: General: No chills, fever, night sweats or weight changes  Cardiovascular:  No chest pain, dyspnea on exertion, edema, orthopnea, palpitations, paroxysmal nocturnal dyspnea Dermatological: No rash, lesions or masses Respiratory: No cough, dyspnea Urologic: No hematuria, dysuria Abdominal: No nausea, vomiting, diarrhea, bright red blood per rectum, melena, or hematemesis Neurologic: No visual changes, weakness, changes in mental status All other systems reviewed and are otherwise negative except as noted above.  Physical Exam: Vitals:   07/02/20 0822  BP: 112/80  Pulse: 84  SpO2: 100%  Weight: 145 lb (65.8 kg)  Height: 5\' 2"  (1.575 m)    GEN- The patient is well appearing, alert and oriented x 3 today.   HEENT: normocephalic, atraumatic; sclera clear, conjunctiva pink; hearing intact; oropharynx clear; neck supple, no JVP Lymph- no cervical lymphadenopathy Lungs- Clear to ausculation bilaterally, normal work of breathing.  No wheezes, rales, rhonchi Heart- Regular rate and rhythm, no murmurs, rubs or gallops, PMI not laterally displaced GI- soft, non-tender, non-distended, bowel sounds present, no hepatosplenomegaly Extremities- no clubbing, cyanosis, or edema; DP/PT/radial pulses 2+ bilaterally MS- no significant deformity or atrophy Skin- warm and dry, no rash or lesion Psych- euthymic mood, full affect Neuro- strength and sensation are intact  EKG is not ordered.   Additional studies reviewed include: Monitor showed ->  Max HR 222 bpm 07:44am, 08/18 Min HR48 bpm 12:16am, 08/15 Avg HR 81 bpm Less than 1% PACs and PVCs  11 SVT runs occurred, the fastest five beats at 222 bpm, longest 8.6 seconds Predominant rhythm was sinus rhythm  Zero atrial fibrillation noted Triggered events associated with sinus rhythm  Assessment and Plan:  1. NSVT 2. Palpitations Monitoring as above with occasional SVT and symptoms associated with NSR. Longest SVT 8.6  seconds.  No symptoms of ischemic heart disease with normal coronaries by cath in 2019 BMET and mag WNL 05/10/2020 Echo 05/24/20 LVEF 55 -60% Continue Toprol 50AM and 100mg  PM Decrease lisinopril to 10mg  daily Advised to stay well hydrated TSH normal 08/2019  3. HTN Continue current medications With labile BP and intermittent lightheadedness will decrease lisinopril for more stable BPs  She is doing well overall.   , PA-C  07/02/20 8:35 AM

## 2020-07-02 ENCOUNTER — Other Ambulatory Visit: Payer: Self-pay

## 2020-07-02 ENCOUNTER — Ambulatory Visit (INDEPENDENT_AMBULATORY_CARE_PROVIDER_SITE_OTHER): Payer: No Typology Code available for payment source | Admitting: Student

## 2020-07-02 ENCOUNTER — Encounter: Payer: Self-pay | Admitting: Student

## 2020-07-02 VITALS — BP 112/80 | HR 84 | Ht 62.0 in | Wt 145.0 lb

## 2020-07-02 DIAGNOSIS — I10 Essential (primary) hypertension: Secondary | ICD-10-CM | POA: Diagnosis not present

## 2020-07-02 DIAGNOSIS — I472 Ventricular tachycardia: Secondary | ICD-10-CM | POA: Diagnosis not present

## 2020-07-02 DIAGNOSIS — R002 Palpitations: Secondary | ICD-10-CM

## 2020-07-02 DIAGNOSIS — I4729 Other ventricular tachycardia: Secondary | ICD-10-CM

## 2020-07-02 MED ORDER — LISINOPRIL 10 MG PO TABS
10.0000 mg | ORAL_TABLET | Freq: Every day | ORAL | 6 refills | Status: DC
Start: 2020-07-02 — End: 2021-10-15

## 2020-07-02 NOTE — Patient Instructions (Addendum)
Medication Instructions:  Your physician has recommended you make the following change in your medication:  -- DECREASE Lisinopril to 10 mg - Take 1 tablet (10 mg) by mouth daily.   *If you need a refill on your cardiac medications before your next appointment, please call your pharmacy*  Follow-Up: At [facility], you and your health needs are our priority.  As part of our continuing mission to provide you with exceptional heart care, we have created designated Provider Care Teams.  These Care Teams include your primary Cardiologist (physician) and Advanced Practice Providers (APPs -  Physician Assistants and Nurse Practitioners) who all work together to provide you with the care you need, when you need it.  We recommend signing up for the patient portal called "MyChart".  Sign up information is provided on this After Visit Summary.  MyChart is used to connect with patients for Virtual Visits (Telemedicine).  Patients are able to view lab/test results, encounter notes, upcoming appointments, etc.  Non-urgent messages can be sent to your provider as well.   To learn more about what you can do with MyChart, go to [email or web address].    Your next appointment:   Your physician recommends that you schedule a follow-up appointment in: 3 MONTHS with Dr. Elberta Fortis. --  Thursday 10/24/20 at 9:45 am  The format for your next appointment:   In Person with Loman Brooklyn, MD

## 2020-07-23 ENCOUNTER — Telehealth: Payer: Self-pay | Admitting: Family Medicine

## 2020-07-23 NOTE — Telephone Encounter (Signed)
pt want to know the status of her hydrocodone; she said on 9/16, the pharmacist only refilled half, stating it requires authorization want to know the status of the refill  [phone]

## 2020-07-24 NOTE — Telephone Encounter (Signed)
[facility] called and spoke to [place], [facility] about the patient's call below. He says she opted to only receive a 30 day supply because the insurance will not pay for any more than that without a PA. Insurance number provided to call and inquire. I called and spoke to Sam in the PA department [facility] with [facility]. He says there is no PA on file for the Hydrocodone 5-325 mg. He faxed the form over to the office for PA approval. Patient called, left detailed VM notifying of the above PA status and that as soon as it's approved, the pharmacy will be notified.

## 2020-07-26 ENCOUNTER — Telehealth: Payer: Self-pay | Admitting: *Deleted

## 2020-07-26 NOTE — Telephone Encounter (Signed)
This encounter was created in error - please disregard.

## 2020-07-26 NOTE — Telephone Encounter (Signed)
Patient called and advised PA went through, advised she will need a PM visit for additional refills, appointment scheduled for Wednesday, 07/31/20 at 0945.

## 2020-07-26 NOTE — Telephone Encounter (Signed)
Prior Auth completed for Hydrocodone Biartrate/Acetaminophen.  Key: [id number] and PA Case ID: [id number].

## 2020-07-30 ENCOUNTER — Other Ambulatory Visit: Payer: Self-pay

## 2020-07-31 ENCOUNTER — Ambulatory Visit: Payer: No Typology Code available for payment source | Admitting: Family Medicine

## 2020-07-31 ENCOUNTER — Encounter: Payer: Self-pay | Admitting: Family Medicine

## 2020-07-31 ENCOUNTER — Telehealth: Payer: Self-pay | Admitting: Family Medicine

## 2020-07-31 VITALS — BP 110/62 | HR 86 | Temp 97.9°F | Ht 62.0 in | Wt 145.8 lb

## 2020-07-31 DIAGNOSIS — M545 Low back pain, unspecified: Secondary | ICD-10-CM | POA: Diagnosis not present

## 2020-07-31 DIAGNOSIS — F418 Other specified anxiety disorders: Secondary | ICD-10-CM | POA: Insufficient documentation

## 2020-07-31 DIAGNOSIS — Z23 Encounter for immunization: Secondary | ICD-10-CM

## 2020-07-31 DIAGNOSIS — F1190 Opioid use, unspecified, uncomplicated: Secondary | ICD-10-CM | POA: Diagnosis not present

## 2020-07-31 DIAGNOSIS — G8929 Other chronic pain: Secondary | ICD-10-CM | POA: Diagnosis not present

## 2020-07-31 MED ORDER — ESCITALOPRAM OXALATE 10 MG PO TABS: 10.0000 mg | ORAL_TABLET | Freq: Every day | ORAL | 2 refills | Status: DC

## 2020-07-31 MED ORDER — HYDROCODONE-ACETAMINOPHEN 5-325 MG PO TABS: 1.0000 | ORAL_TABLET | Freq: Four times a day (QID) | ORAL | 0 refills | Status: AC | PRN

## 2020-07-31 MED ORDER — HYDROCODONE-ACETAMINOPHEN 5-325 MG PO TABS: 1.0000 | ORAL_TABLET | Freq: Four times a day (QID) | ORAL | 0 refills | Status: DC | PRN

## 2020-07-31 NOTE — Addendum Note (Signed)
Addended by: Lerry Liner on: 07/31/2020 10:53 AM   Modules accepted: Orders

## 2020-07-31 NOTE — Addendum Note (Signed)
Addended by: Tatyanna Cronk. M on: 07/31/2020 10:53 AM   Modules accepted: Orders  

## 2020-07-31 NOTE — Addendum Note (Signed)
Addended by: Wilford Corner on: 07/31/2020 02:02 PM   Modules accepted: Orders

## 2020-07-31 NOTE — Progress Notes (Signed)
   Subjective:    Patient ID: Caroline Boone, female    DOB: 1966/07/15, 54 y.o.   MRN: [id number]  HPI Here for pain management. Her back has been hurting more lately and she thins this is due to several issues. First she is short of help at work and she has been working very long hours. Also she is under a lot of stress because she is going through a separation from her husband.  Indication for chronic opioid: low back pain  Medication and dose: Norco 5-325 # pills per month: 60 Last UDS date: 08-24-19 Opioid Treatment Agreement signed (Y/N): 03-22-18 Opioid Treatment Agreement last reviewed with patient:  07-31-20 NCCSRS reviewed this encounter (include red flags): Yes    Review of Systems     Objective:   Physical Exam        Assessment & Plan:  Pain management, we will refill the Norco. Also she will try Lexapro 10 mg daily to help with the stress levels.  Gershon Crane, MD

## 2020-08-02 LAB — DRUG MONITOR, PANEL 1, W/CONF, URINE
Amphetamines: NEGATIVE ng/mL (ref <500)
Barbiturates: NEGATIVE ng/mL (ref <300)
Benzodiazepines: NEGATIVE ng/mL (ref <100)
Cocaine Metabolite: NEGATIVE ng/mL (ref <150)
Codeine: 265 ng/mL — ABNORMAL HIGH (ref <50)
Creatinine: 33 mg/dL
Hydrocodone: 308 ng/mL — ABNORMAL HIGH (ref <50)
Hydromorphone: NEGATIVE ng/mL (ref <50)
Marijuana Metabolite: NEGATIVE ng/mL (ref <20)
Methadone Metabolite: NEGATIVE ng/mL (ref <100)
Morphine: NEGATIVE ng/mL (ref <50)
Norhydrocodone: 439 ng/mL — ABNORMAL HIGH (ref <50)
Opiates: POSITIVE ng/mL — AB (ref <100)
Oxidant: NEGATIVE ug/mL
Oxycodone: NEGATIVE ng/mL (ref <100)
Phencyclidine: NEGATIVE ng/mL (ref <25)
pH: 6.3 (ref 4.5–9.0)

## 2020-08-02 LAB — DM TEMPLATE

## 2020-08-12 ENCOUNTER — Telehealth (INDEPENDENT_AMBULATORY_CARE_PROVIDER_SITE_OTHER): Payer: No Typology Code available for payment source | Admitting: Internal Medicine

## 2020-08-12 DIAGNOSIS — J019 Acute sinusitis, unspecified: Secondary | ICD-10-CM

## 2020-08-12 MED ORDER — DOXYCYCLINE HYCLATE 100 MG PO TABS: 100.0000 mg | ORAL_TABLET | Freq: Two times a day (BID) | ORAL | 0 refills | Status: DC

## 2020-08-12 NOTE — Progress Notes (Signed)
Virtual Visit via Video Note  I connected with@ on 08/12/20 at  3:30 PM EDT by a video enabled telemedicine application and verified that I am speaking with the correct person using two identifiers. Location patient: home Location provider:work or home office Persons participating in the virtual visit: patient, provider  [place]th national recommendations  regarding COVID 19 pandemic   video visit is advised over in office visit for this patient.  Patient aware  of the limitations of evaluation and management by telemedicine and  availability of in person appointments. and agreed to proceed.   HPI: Caroline Boone presents for video visit sda PCP appt NA  Second  week over 10 days or sx and now getting worse   Onset like a cold  coudh only at night nasal congestion   mucinex and tylenol sinus  And  Not much relief   Congestion   Worse  In past 3 days  And cheek areas hurt  Right more than left     Under eyes   And puffy.   Feels like a sinus infection Took off work today   No fever some ha   Had covid vaccine and no exposures per se  Vaccinated .  wo ROS: See pertinent positives and negatives per HPI. No fever  covid exposures  Past Medical History:  Diagnosis Date  . Anxiety   . Carotid artery injury   . Carotid stenosis    Dopplers 6/12:0-39% bilateral; distal ICAs with marked tortuosity; CT angiogram recommended;  followed at [facility]; surgery to correct LICA stenosis and pseudoaneurysm too risky - > medical Rx;  dopplers 8/13: B/L 0-39% (stable)  . Cerebral aneurysm   . Chest pain    GXT echo 8/11: normal LVF, no ischemia;  h/o neg. myoview in 2005  . Family history of adverse reaction to anesthesia    MOMTHER HAD TROUBLE WAKING  . Fibromuscular dysplasia (HCC)    affects left vertebral,  artery, left ICA, left renal artery;  head and neck CTA 10/24/11: stable pattern of fibromuscular dysplasia of ICA and VA with distal changes suggesting prior pseudoaneurysm and dissection; L dist ICA  60%; normal MRI 10/29/11;  followed at [facility] with serial CT scans  . GERD (gastroesophageal reflux disease)   . Headache   . History of cardiac cath 2007   cath 11/25/05 by Dr. Jenne Campus with normal cors and EF 50%  . Hypertension    a. echo 7/11: mod LVH, EF 55-65%  . Insomnia   . Palpitations    hx  . 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 Prescott Parma  at [facility]    Past Surgical History:  Procedure Laterality Date  . BREAST ENHANCEMENT SURGERY  1991   Bilateral breast   . COLONOSCOPY  04/27/2014   per Dr. Loreta Ave, tubular adenomas, repeat in 5 yrs   . LEFT HEART CATH AND CORONARY ANGIOGRAPHY N/A 01/12/2018   Procedure: LEFT HEART CATH AND CORONARY ANGIOGRAPHY;  Surgeon: Swaziland, Peter M, MD;  Location: [facility] INVASIVE CV LAB;  Service: Cardiovascular;  Laterality: N/A;    Family History  Problem Relation Age of Onset  . Coronary artery disease Mother   . Kidney disease Mother   . Heart disease Mother 43  . Heart attack Father 25       CABG  . Kidney failure Father   . Hypertension Father   . Heart disease Father   . Arthritis Other  family hx  . Breast cancer Other        relative ,50  . Diabetes Other        family hx  . Hypertension Other        family hx  . Kidney disease Other        family hx  . Lung cancer Other        family hx    Social History   Tobacco Use  . Smoking status: Never Smoker  . Smokeless tobacco: Never Used  Vaping Use  . Vaping Use: Never used  Substance Use Topics  . Alcohol use: No    Alcohol/week: 0.0 standard drinks  . Drug use: No      Current Outpatient Medications:  .  acetaminophen (TYLENOL) 500 MG tablet, Take 1,000 mg by mouth every 6 (six) hours as needed for moderate pain., Disp: , Rfl:  .  ALPRAZolam (XANAX) 1 MG tablet, Take 1 mg by mouth at bedtime as needed for anxiety., Disp: , Rfl:  .  aspirin 81 MG tablet, Take 81 mg by mouth daily.  , Disp: , Rfl:  .   cyclobenzaprine (FLEXERIL) 10 MG tablet, take 1 tablet by mouth three times a day if needed for muscle spasm, Disp: 90 tablet, Rfl: 5 .  escitalopram (LEXAPRO) 10 MG tablet, Take 1 tablet (10 mg total) by mouth daily., Disp: 30 tablet, Rfl: 2 .  hydrochlorothiazide (HYDRODIURIL) 25 MG tablet, Take 1 tablet (25 mg total) by mouth daily., Disp: 30 tablet, Rfl: 4 .  [START ON 09/30/2020] HYDROcodone-acetaminophen (NORCO) 5-325 MG tablet, Take 1 tablet by mouth every 6 (six) hours as needed for moderate pain., Disp: 60 tablet, Rfl: 0 .  lisinopril (ZESTRIL) 10 MG tablet, Take 1 tablet (10 mg total) by mouth daily., Disp: 30 tablet, Rfl: 6 .  metoprolol succinate (TOPROL-XL) 50 MG 24 hr tablet, Take 3 tablets (150 mg total) by mouth daily. TAKE 50 MG IN AM AND 100 MG IN PM (Patient taking differently: Take 50 mg by mouth in the morning and at bedtime. TAKE 50 MG IN AM AND 100 MG IN PM), Disp: 270 tablet, Rfl: 1 .  promethazine (PHENERGAN) 25 MG tablet, TAKE 1 TABLET(25 MG) BY MOUTH EVERY 4 HOURS AS NEEDED FOR NAUSEA OR VOMITING, Disp: 60 tablet, Rfl: 5 .  zolpidem (AMBIEN) 10 MG tablet, TAKE 1 TABLET(10 MG) BY MOUTH AT BEDTIME AS NEEDED FOR SLEEP, Disp: 30 tablet, Rfl: 5 .  doxycycline (VIBRA-TABS) 100 MG tablet, Take 1 tablet (100 mg total) by mouth 2 (two) times daily., Disp: 14 tablet, Rfl: 0  EXAM: BP Readings from Last 3 Encounters:  07/31/20 110/62  07/02/20 112/80  05/10/20 118/76    VITALS per patient if applicable:  GENERAL: alert, oriented, appears well and in no acute distress no coughing    HEENT: atraumatic, conjunttiva clear, no obvious abnormalities on inspection of external nose and ears  Tender when she touched under eyes right  No obvious edema  NECK: normal movements of the head and neck LUNGS: on inspection no signs of respiratory distress, breathing rate appears normal, no obvious gross SOB, gasping or wheezing CV: no obvious cyanosis PSYCH/NEURO: pleasant and cooperative, no  obvious depression or anxiety, speech and thought processing grossly intact ASSESSMENT AND PLAN:  Discussed the following assessment and plan:    ICD-10-CM   1. Acute sinusitis with symptoms greater than 10 days  J01.90    Sinusitis  Hx of same  Empiric adding  of antibiotic and attention to sinus hygiene adding saline  Afrin x 2 nights if needed for face pain  Counseled.   Expectant management and discussion of plan and treatment with opportunity to ask questions and all were answered. The patient agreed with the plan and demonstrated an understanding of the instructions.   Advised to call back or seek an in-person evaluation if worsening  or having  further concerns . Return if symptoms worsen or fail to improve as expected.   Berniece Andreas, MD

## 2020-09-30 ENCOUNTER — Other Ambulatory Visit: Payer: Self-pay | Admitting: Family Medicine

## 2020-10-24 ENCOUNTER — Ambulatory Visit: Payer: No Typology Code available for payment source | Admitting: Cardiology

## 2020-10-24 NOTE — Progress Notes (Deleted)
Electrophysiology Office Note   Date:  10/24/2020   ID:  Caroline Boone, DOB 1966/08/23, MRN [id number]  PCP:  Nelwyn Salisbury, MD  Cardiologist:  Hochrein Primary Electrophysiologist:  Polly Barner Jorja Loa, MD    No chief complaint on file.    History of Present Illness: Caroline Boone is a 55 y.o. female who is being seen today for the evaluation of NSVT at the request of Rollene Rotunda. Presenting today for electrophysiology evaluation.    She has a history significant for carotid stenosis, cerebral aneurysm, hypertension, and palpitations.  She underwent cardiac catheterization 01/12/2018 which revealed normal coronary anatomy.  Per hospital records she had a 14 beat run of nonsustained ventricular tachycardia.  She has had palpitations since that time.  She has had episodes where her heart goes both fast and slow.  Her palpitations have been quite irregular.  She feels hot and flushed when she has palpitations and they last a few seconds at a time.  She was initially started on flecainide but had side effects.  Today, denies symptoms of palpitations, chest pain, shortness of breath, orthopnea, PND, lower extremity edema, claudication, dizziness, presyncope, syncope, bleeding, or neurologic sequela. The patient is tolerating medications without difficulties. ***    Past Medical History:  Diagnosis Date  . Anxiety   . Carotid artery injury   . Carotid stenosis    Dopplers 6/12:0-39% bilateral; distal ICAs with marked tortuosity; CT angiogram recommended;  followed at [facility]; surgery to correct LICA stenosis and pseudoaneurysm too risky - > medical Rx;  dopplers 8/13: B/L 0-39% (stable)  . Cerebral aneurysm   . Chest pain    GXT echo 8/11: normal LVF, no ischemia;  h/o neg. myoview in 2005  . Family history of adverse reaction to anesthesia    MOMTHER HAD TROUBLE WAKING  . Fibromuscular dysplasia (HCC)    affects left vertebral,  artery, left ICA, left renal artery;  head and neck  CTA 10/24/11: stable pattern of fibromuscular dysplasia of ICA and VA with distal changes suggesting prior pseudoaneurysm and dissection; L dist ICA 60%; normal MRI 10/29/11;  followed at [facility] with serial CT scans  . GERD (gastroesophageal reflux disease)   . Headache   . History of cardiac cath 2007   cath 11/25/05 by Dr. Jenne Campus with normal cors and EF 50%  . Hypertension    a. echo 7/11: mod LVH, EF 55-65%  . Insomnia   . Palpitations    hx  . 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 Prescott Parma  at [facility]   Past Surgical History:  Procedure Laterality Date  . BREAST ENHANCEMENT SURGERY  1991   Bilateral breast   . COLONOSCOPY  04/27/2014   per Dr. Loreta Ave, tubular adenomas, repeat in 5 yrs   . LEFT HEART CATH AND CORONARY ANGIOGRAPHY N/A 01/12/2018   Procedure: LEFT HEART CATH AND CORONARY ANGIOGRAPHY;  Surgeon: Swaziland, Peter M, MD;  Location: [facility] INVASIVE CV LAB;  Service: Cardiovascular;  Laterality: N/A;     Current Outpatient Medications  Medication Sig Dispense Refill  . acetaminophen (TYLENOL) 500 MG tablet Take 1,000 mg by mouth every 6 (six) hours as needed for moderate pain.    Marland Kitchen ALPRAZolam (XANAX) 1 MG tablet Take 1 mg by mouth at bedtime as needed for anxiety.    Marland Kitchen aspirin 81 MG tablet Take 81 mg by mouth daily.      . cyclobenzaprine (FLEXERIL) 10  MG tablet take 1 tablet by mouth three times a day if needed for muscle spasm 90 tablet 5  . doxycycline (VIBRA-TABS) 100 MG tablet Take 1 tablet (100 mg total) by mouth 2 (two) times daily. 14 tablet 0  . escitalopram (LEXAPRO) 10 MG tablet TAKE 1 TABLET(10 MG) BY MOUTH DAILY 30 tablet 2  . hydrochlorothiazide (HYDRODIURIL) 25 MG tablet Take 1 tablet (25 mg total) by mouth daily. 30 tablet 4  . HYDROcodone-acetaminophen (NORCO) 5-325 MG tablet Take 1 tablet by mouth every 6 (six) hours as needed for moderate pain. 60 tablet 0  . lisinopril (ZESTRIL) 10 MG tablet Take 1 tablet (10  mg total) by mouth daily. 30 tablet 6  . metoprolol succinate (TOPROL-XL) 50 MG 24 hr tablet Take 3 tablets (150 mg total) by mouth daily. TAKE 50 MG IN AM AND 100 MG IN PM (Patient taking differently: Take 50 mg by mouth in the morning and at bedtime. TAKE 50 MG IN AM AND 100 MG IN PM) 270 tablet 1  . promethazine (PHENERGAN) 25 MG tablet TAKE 1 TABLET(25 MG) BY MOUTH EVERY 4 HOURS AS NEEDED FOR NAUSEA OR VOMITING 60 tablet 5  . zolpidem (AMBIEN) 10 MG tablet TAKE 1 TABLET(10 MG) BY MOUTH AT BEDTIME AS NEEDED FOR SLEEP 30 tablet 5   No current facility-administered medications for this visit.    Allergies:   Amoxicillin, Sulfamethoxazole, and Sulfonamide derivatives   Social History:  The patient  reports that she has never smoked. She has never used smokeless tobacco. She reports that she does not drink alcohol and does not use drugs.   Family History:  The patient's family history includes Arthritis in an other family member; Breast cancer in an other family member; Coronary artery disease in her mother; Diabetes in an other family member; Heart attack (age of onset: 13) in her father; Heart disease in her father; Heart disease (age of onset: 101) in her mother; Hypertension in her father and another family member; Kidney disease in her mother and another family member; Kidney failure in her father; Lung cancer in an other family member.   ROS:  Please see the history of present illness.   Otherwise, review of systems is positive for none.   All other systems are reviewed and negative.   PHYSICAL EXAM: VS:  LMP 06/14/2011  , BMI There is no height or weight on file to calculate BMI. GEN: Well nourished, well developed, in no acute distress  HEENT: normal  Neck: no JVD, carotid bruits, or masses Cardiac: ***RRR; no murmurs, rubs, or gallops,no edema  Respiratory:  clear to auscultation bilaterally, normal work of breathing GI: soft, nontender, nondistended, + BS MS: no deformity or atrophy   Skin: warm and dry Neuro:  Strength and sensation are intact Psych: euthymic mood, full affect  EKG:  EKG {ACTION; IS/IS [id number]} ordered today. Personal review of the ekg ordered *** shows ***   Recent Labs: 05/10/2020: BUN 6; Creatinine, Ser 0.63; Magnesium 2.0; Potassium 4.2; Sodium 134    Lipid Panel     Component Value Date/Time   CHOL 210 (H) 08/24/2019 0914   TRIG 59.0 08/24/2019 0914   HDL 72.30 08/24/2019 0914   CHOLHDL 3 08/24/2019 0914   VLDL 11.8 08/24/2019 0914   LDLCALC 126 (H) 08/24/2019 0914     Wt Readings from Last 3 Encounters:  07/31/20 145 lb 12.8 oz (66.1 kg)  07/02/20 145 lb (65.8 kg)  05/10/20 144 lb (65.3 kg)  Other studies Reviewed: Additional studies/ records that were reviewed today include: TTE 01/13/18  Review of the above records today demonstrates:  - Left ventricle: The cavity size was normal. Wall thickness was   normal. Systolic function was vigorous. The estimated ejection   fraction was in the range of 65% to 70%. Wall motion was normal;   there were no regional wall motion abnormalities. Doppler   parameters are consistent with abnormal left ventricular   relaxation (grade 1 diastolic dysfunction). The E/e&' ratio is   between 8-15, suggesting indeterminate LV filling pressure. - Mitral valve: Mildly thickened leaflets . There was trivial   regurgitation. - Left atrium: The atrium was normal in size. - Inferior vena cava: The vessel was normal in size. The   respirophasic diameter changes were in the normal range (>= 50%),   consistent with normal central venous pressure.  LHC 01/12/18  The left ventricular systolic function is normal.  LV end diastolic pressure is normal.  The left ventricular ejection fraction is 55-65% by visual estimate.   1. Normal coronary anatomy 2. Normal LV function 3. Normal LVEDP  30-day monitor 02/06/2018 personally reviewed Sinus rhythm, PVCs, nonsustained VT 14 beats  ETT 03/24/18 -  personally reviewed  Blood pressure demonstrated a normal response to exercise.  There was no ST segment deviation noted during stress.  Clinically and electrically negative for ischemia.  Negative GXT  ASSESSMENT AND PLAN:  1.  Nonsustained VT: Currently on Toprol-XL.  ***  2.  Hypertension:***  3.  Fibromuscular dysplasia: Being managed conservatively  Current medicines are reviewed at length with the patient today.   The patient does not have concerns regarding her medicines.  The following changes were made today:***  LabsI agree/ tests ordered today include:  No orders of the defined types were placed in this encounter.  Disposition:   FU with Senon Nixon ***morning area months  S isigned, Katrianna Friesenhahn Jorja Loa, MD  10/24/2020 8:53 AM     [facility] [address] [place] [address] [phone] (office) [phone] (fax)

## 2020-11-01 ENCOUNTER — Other Ambulatory Visit: Payer: Self-pay | Admitting: Family Medicine

## 2020-11-01 NOTE — Telephone Encounter (Signed)
Patient is calling and refill for hydrocodone sent to  [facility] [address] [place]  [address] [place], [place] [address]  Phone:  [phone] Fax:  [phone] CB is [phone]

## 2020-11-01 NOTE — Telephone Encounter (Signed)
She needs a PMV for this  

## 2020-11-01 NOTE — Telephone Encounter (Signed)
Patient called and verified she's on Hydrocodone, since it's not listed on her current medication list. She says she's been taking this for a while for back pain. I advised I will send this to Dr. Clent Ridges for refilling.  Last OV: 07/31/20 Last refill: 08/31/20

## 2020-11-02 ENCOUNTER — Other Ambulatory Visit: Payer: Self-pay | Admitting: Family Medicine

## 2020-11-04 NOTE — Telephone Encounter (Signed)
Last office visit-  08/12/2020 Last refill--05/14/2020---30 tabs with 5 refills

## 2020-11-06 ENCOUNTER — Encounter: Payer: Self-pay | Admitting: Family Medicine

## 2020-11-06 ENCOUNTER — Telehealth (INDEPENDENT_AMBULATORY_CARE_PROVIDER_SITE_OTHER): Payer: No Typology Code available for payment source | Admitting: Family Medicine

## 2020-11-06 DIAGNOSIS — G8929 Other chronic pain: Secondary | ICD-10-CM | POA: Diagnosis not present

## 2020-11-06 DIAGNOSIS — F1190 Opioid use, unspecified, uncomplicated: Secondary | ICD-10-CM

## 2020-11-06 DIAGNOSIS — M545 Low back pain, unspecified: Secondary | ICD-10-CM

## 2020-11-06 MED ORDER — METOPROLOL SUCCINATE ER 50 MG PO TB24
50.0000 mg | ORAL_TABLET | Freq: Two times a day (BID) | ORAL | 0 refills | Status: DC
Start: 2020-11-06 — End: 2024-07-27

## 2020-11-06 MED ORDER — HYDROCODONE-ACETAMINOPHEN 5-325 MG PO TABS: 1.0000 | ORAL_TABLET | Freq: Two times a day (BID) | ORAL | 0 refills | Status: AC | PRN

## 2020-11-06 MED ORDER — HYDROCODONE-ACETAMINOPHEN 5-325 MG PO TABS: 1.0000 | ORAL_TABLET | Freq: Two times a day (BID) | ORAL | 0 refills | Status: DC | PRN

## 2020-11-06 NOTE — Progress Notes (Signed)
   Subjective:    Patient ID: Caroline Boone, female    DOB: 02/15/1966, 55 y.o.   MRN: [id number]  HPI Virtual Visit via Telephone Note  I connected with the patient on 11/06/20 at 10:00 AM EST by telephone and verified that I am speaking with the correct person using two identifiers.   I discussed the limitations, risks, security and privacy concerns of performing an evaluation and management service by telephone and the availability of in person appointments. I also discussed with the patient that there may be a patient responsible charge related to this service. The patient expressed understanding and agreed to proceed.  Location patient: home Location provider: work or home office Participants present for the call: patient, provider Patient did not have a visit in the prior 7 days to address this/these issue(s).   History of Present Illness: Here for pain management, she is doing well.  Indication for chronic opioid: low back pain Medication and dose: Norco 5-325 # pills per month: 60 Last UDS date: 07-31-20 Opioid Treatment Agreement signed (Y/N): 03-22-18 Opioid Treatment Agreement last reviewed with patient:  11-06-20 NCCSRS reviewed this encounter (include red flags): Yes    Observations/Objective: Patient sounds cheerful and well on the phone. I do not appreciate any SOB. Speech and thought processing are grossly intact. Patient reported vitals:  Assessment and Plan: paini management, meds were refilled.  Gershon Crane, MD   Follow Up Instructions:     [phone] 5-10 [phone] 11-20 [phone] 21-30 I did not refer this patient for an OV in the next 24 hours for this/these issue(s).  I discussed the assessment and treatment plan with the patient. The patient was provided an opportunity to ask questions and all were answered. The patient agreed with the plan and demonstrated an understanding of the instructions.   The patient was advised to call back or seek an in-person  evaluation if the symptoms worsen or if the condition fails to improve as anticipated.  I provided 11 minutes of non-face-to-face time during this encounter.   Gershon Crane, MD   Review of Systems     Objective:   Physical Exam        Assessment & Plan:

## 2020-11-07 ENCOUNTER — Other Ambulatory Visit (HOSPITAL_COMMUNITY): Payer: Self-pay | Admitting: Gastroenterology

## 2020-11-07 ENCOUNTER — Other Ambulatory Visit: Payer: Self-pay | Admitting: Gastroenterology

## 2020-11-07 DIAGNOSIS — R1011 Right upper quadrant pain: Secondary | ICD-10-CM

## 2020-11-23 ENCOUNTER — Other Ambulatory Visit: Payer: Self-pay | Admitting: Family Medicine

## 2020-11-25 NOTE — Telephone Encounter (Signed)
Last office visit- 11/06/20 Last refill- 07/02/2020

## 2020-11-27 ENCOUNTER — Ambulatory Visit (HOSPITAL_COMMUNITY)
Admission: RE | Admit: 2020-11-27 | Discharge: 2020-11-27 | Disposition: A | Discharge status: Home / Self Care | Payer: No Typology Code available for payment source | Source: Ambulatory Visit | Attending: Gastroenterology | Admitting: Gastroenterology

## 2020-11-27 ENCOUNTER — Other Ambulatory Visit: Payer: Self-pay

## 2020-11-27 DIAGNOSIS — R1011 Right upper quadrant pain: Secondary | ICD-10-CM | POA: Diagnosis not present

## 2020-11-27 MED ORDER — TECHNETIUM TC 99M MEBROFENIN IV KIT
5.0000 | PACK | Freq: Once | INTRAVENOUS | Status: AC | PRN
  Administered 2020-11-27: 5 via INTRAVENOUS

## 2020-12-04 LAB — HM COLONOSCOPY

## 2020-12-17 ENCOUNTER — Other Ambulatory Visit: Payer: Self-pay | Admitting: Family Medicine

## 2020-12-18 NOTE — Telephone Encounter (Signed)
Last refill- 09/27/2020 Last office visit- 08/12/2020  No future appointment scheduled

## 2020-12-30 ENCOUNTER — Other Ambulatory Visit: Payer: Self-pay | Admitting: Nephrology

## 2020-12-30 DIAGNOSIS — I773 Arterial fibromuscular dysplasia: Secondary | ICD-10-CM

## 2021-01-08 ENCOUNTER — Other Ambulatory Visit: Payer: No Typology Code available for payment source

## 2021-01-09 ENCOUNTER — Other Ambulatory Visit: Payer: No Typology Code available for payment source

## 2021-01-11 ENCOUNTER — Other Ambulatory Visit: Payer: Self-pay | Admitting: Family Medicine

## 2021-01-11 NOTE — Telephone Encounter (Signed)
Pt LOV was on 11/06/2020 and last refill was 09/30/2020 for 30 tablets with 2 refills, please advise if ok to send refill

## 2021-01-14 ENCOUNTER — Ambulatory Visit
Admission: RE | Admit: 2021-01-14 | Discharge: 2021-01-14 | Disposition: A | Discharge status: Home / Self Care | Payer: No Typology Code available for payment source | Source: Ambulatory Visit | Attending: Nephrology | Admitting: Nephrology

## 2021-01-14 DIAGNOSIS — I773 Arterial fibromuscular dysplasia: Secondary | ICD-10-CM

## 2021-02-19 ENCOUNTER — Encounter: Payer: Self-pay | Admitting: Family Medicine

## 2021-02-19 NOTE — Telephone Encounter (Signed)
She needs a PMV 

## 2021-02-24 ENCOUNTER — Other Ambulatory Visit: Payer: Self-pay

## 2021-02-24 ENCOUNTER — Encounter: Payer: Self-pay | Admitting: Family Medicine

## 2021-02-24 ENCOUNTER — Telehealth (INDEPENDENT_AMBULATORY_CARE_PROVIDER_SITE_OTHER): Payer: No Typology Code available for payment source | Admitting: Family Medicine

## 2021-02-24 DIAGNOSIS — G8929 Other chronic pain: Secondary | ICD-10-CM

## 2021-02-24 DIAGNOSIS — F1190 Opioid use, unspecified, uncomplicated: Secondary | ICD-10-CM | POA: Diagnosis not present

## 2021-02-24 DIAGNOSIS — M545 Low back pain, unspecified: Secondary | ICD-10-CM

## 2021-02-24 MED ORDER — ESCITALOPRAM OXALATE 10 MG PO TABS: 10.0000 mg | ORAL_TABLET | Freq: Every day | ORAL | 3 refills | Status: DC

## 2021-02-24 MED ORDER — HYDROCODONE-ACETAMINOPHEN 5-325 MG PO TABS: 1.0000 | ORAL_TABLET | Freq: Two times a day (BID) | ORAL | 0 refills | Status: DC | PRN

## 2021-02-24 MED ORDER — HYDROCODONE-ACETAMINOPHEN 5-325 MG PO TABS: 1.0000 | ORAL_TABLET | Freq: Two times a day (BID) | ORAL | 0 refills | Status: AC | PRN

## 2021-02-24 NOTE — Progress Notes (Signed)
   Subjective:    Patient ID: Caroline Boone, female    DOB: [birth year], 55 y.o.   MRN: [id number]  HPI Virtual Visit via Telephone Note  I connected with the patient on 02/24/21 at  1:00 PM EDT by telephone and verified that I am speaking with the correct person using two identifiers.   I discussed the limitations, risks, security and privacy concerns of performing an evaluation and management service by telephone and the availability of in person appointments. I also discussed with the patient that there may be a patient responsible charge related to this service. The patient expressed understanding and agreed to proceed.  Location patient: home Location provider: work or home office Participants present for the call: patient, provider Patient did not have a visit in the prior 7 days to address this/these issue(s).   History of Present Illness: Here for pain management. She is doing well.    Observations/Objective: Patient sounds cheerful and well on the phone. I do not appreciate any SOB. Speech and thought processing are grossly intact. Patient reported vitals:  Assessment and Plan: Pain management. Indication for chronic opioid: low back pain Medication and dose: Norco 5-325 # pills per month: 60 Last UDS date: 07-31-20 Opioid Treatment Agreement signed (Y/N): 03-22-18 Opioid Treatment Agreement last reviewed with patient:  02-24-21 NCCSRS reviewed this encounter (include red flags): Yes Meds were refilled.  Gershon Crane, MD   Follow Up Instructions:     [phone] 5-10 [phone] 11-20 9443 21-30 I did not refer this patient for an OV in the next 24 hours for this/these issue(s).  I discussed the assessment and treatment plan with the patient. The patient was provided an opportunity to ask questions and all were answered. The patient agreed with the plan and demonstrated an understanding of the instructions.   The patient was advised to call back or seek an in-person  evaluation if the symptoms worsen or if the condition fails to improve as anticipated.  I provided 14 minutes of non-face-to-face time during this encounter.   Gershon Crane, MD    Review of Systems     Objective:   Physical Exam        Assessment & Plan:

## 2021-04-17 NOTE — Telephone Encounter (Signed)
Error

## 2021-04-22 ENCOUNTER — Other Ambulatory Visit: Payer: Self-pay | Admitting: Family Medicine

## 2021-06-16 ENCOUNTER — Ambulatory Visit: Payer: No Typology Code available for payment source | Admitting: Family Medicine

## 2021-06-16 ENCOUNTER — Other Ambulatory Visit: Payer: Self-pay

## 2021-06-16 ENCOUNTER — Encounter: Payer: Self-pay | Admitting: Family Medicine

## 2021-06-16 VITALS — BP 118/78 | HR 78 | Temp 97.8°F | Wt 158.4 lb

## 2021-06-16 DIAGNOSIS — U071 COVID-19: Secondary | ICD-10-CM

## 2021-06-16 DIAGNOSIS — R059 Cough, unspecified: Secondary | ICD-10-CM

## 2021-06-16 MED ORDER — BENZONATATE 200 MG PO CAPS: 200.0000 mg | ORAL_CAPSULE | Freq: Four times a day (QID) | ORAL | 1 refills | Status: DC | PRN

## 2021-06-16 NOTE — Progress Notes (Signed)
   Subjective:    Patient ID: Caroline Boone, female    DOB: [birth year], 55 y.o.   MRN: [id number]  HPI Here for a dry cough that started 2 weeks ago when she developed a Covid-19 infection. After she was around a friend who tested positive, Hideko developed a fever and body aches and a dry cough. She then tested positive for the Covid virus on 05-31-21. Most of her symptoms resolved quickly but the cough remains. No chest pain or SOB. Of note she does take Lisinopril.    Review of Systems  Constitutional: Negative.   Respiratory:  Positive for cough. Negative for chest tightness, shortness of breath and wheezing.   Cardiovascular: Negative.       Objective:   Physical Exam Constitutional:      Appearance: Normal appearance. She is not ill-appearing.  Cardiovascular:     Rate and Rhythm: Normal rate and regular rhythm.     Pulses: Normal pulses.     Heart sounds: Normal heart sounds.  Pulmonary:     Effort: Pulmonary effort is normal.     Breath sounds: Normal breath sounds.  Neurological:     Mental Status: She is alert.          Assessment & Plan:  Persistent cough after a Covid-19 infection. She will use Benzonatate as needed. If the cough does not resolve over the next few weeks, we may consider stopping her ACE inhibitor.  Gershon Crane, MD

## 2021-06-18 ENCOUNTER — Telehealth: Payer: Self-pay | Admitting: Family Medicine

## 2021-06-18 ENCOUNTER — Encounter: Payer: Self-pay | Admitting: Family Medicine

## 2021-06-18 ENCOUNTER — Telehealth (INDEPENDENT_AMBULATORY_CARE_PROVIDER_SITE_OTHER): Payer: No Typology Code available for payment source | Admitting: Family Medicine

## 2021-06-18 DIAGNOSIS — F1190 Opioid use, unspecified, uncomplicated: Secondary | ICD-10-CM | POA: Diagnosis not present

## 2021-06-18 DIAGNOSIS — M545 Low back pain, unspecified: Secondary | ICD-10-CM

## 2021-06-18 DIAGNOSIS — G8929 Other chronic pain: Secondary | ICD-10-CM

## 2021-06-18 MED ORDER — HYDROCODONE-ACETAMINOPHEN 5-325 MG PO TABS: 1.0000 | ORAL_TABLET | Freq: Four times a day (QID) | ORAL | 0 refills | Status: DC | PRN

## 2021-06-18 MED ORDER — HYDROCODONE-ACETAMINOPHEN 5-325 MG PO TABS: 1.0000 | ORAL_TABLET | Freq: Four times a day (QID) | ORAL | 0 refills | Status: AC | PRN

## 2021-06-18 NOTE — Telephone Encounter (Signed)
Patient called asking about prescription Dr.Fry was supposed to send this morning. The prescription is Hydrocodone.    Please send to   [id number] [place], [place] - [address] AT [facility] [address] [facility] [address] Phone:  [phone]  Fax:  [phone]    Please Advise    Good callback [phone]

## 2021-06-18 NOTE — Telephone Encounter (Signed)
Please advise 

## 2021-06-18 NOTE — Telephone Encounter (Signed)
These were sent in  

## 2021-06-18 NOTE — Progress Notes (Signed)
Subjective:    Patient ID: Caroline Boone, female    DOB: [birth year], 55 y.o.   MRN: [id number]  HPI Virtual Visit via Video Note  I connected with the patient on 06/18/21 at  8:30 AM EDT by a video enabled telemedicine application and verified that I am speaking with the correct person using two identifiers.  Location patient: home Location provider:work or home office Persons participating in the virtual visit: patient, provider  I discussed the limitations of evaluation and management by telemedicine and the availability of in person appointments. The patient expressed understanding and agreed to proceed.   HPI: Here for pain management. She is doing well but she asks if she can take the Norco more often on days when she has more pain.    ROS: See pertinent positives and negatives per HPI.  Past Medical History:  Diagnosis Date   Anxiety    Carotid artery injury    Carotid stenosis    Dopplers 6/12:0-39% bilateral; distal ICAs with marked tortuosity; CT angiogram recommended;  followed at [facility]; surgery to correct LICA stenosis and pseudoaneurysm too risky - > medical Rx;  dopplers 8/13: B/L 0-39% (stable)   Cerebral aneurysm    Chest pain    GXT echo 8/11: normal LVF, no ischemia;  h/o neg. myoview in 2005   Family history of adverse reaction to anesthesia    MOMTHER HAD TROUBLE WAKING   Fibromuscular dysplasia (HCC)    affects left vertebral,  artery, left ICA, left renal artery;  head and neck CTA 10/24/11: stable pattern of fibromuscular dysplasia of ICA and VA with distal changes suggesting prior pseudoaneurysm and dissection; L dist ICA 60%; normal MRI 10/29/11;  followed at [facility] with serial CT scans   GERD (gastroesophageal reflux disease)    Headache    History of cardiac cath 2007   cath 11/25/05 by Dr. Jenne Campus with normal cors and EF 50%   Hypertension    a. echo 7/11: mod LVH, EF 55-65%   Insomnia    Palpitations    hx   Vertebral artery obstruction    left  vertebral and left ICA due to pseudoaneurysms, sees Dr. Prescott Parma  at [facility]    Past Surgical History:  Procedure Laterality Date   BREAST ENHANCEMENT SURGERY  1991   Bilateral breast    COLONOSCOPY  04/27/2014   per Dr. Loreta Ave, tubular adenomas, repeat in 5 yrs    LEFT HEART CATH AND CORONARY ANGIOGRAPHY N/A 01/12/2018   Procedure: LEFT HEART CATH AND CORONARY ANGIOGRAPHY;  Surgeon: Swaziland, Peter M, MD;  Location: [facility] INVASIVE CV LAB;  Service: Cardiovascular;  Laterality: N/A;    Family History  Problem Relation Age of Onset   Coronary artery disease Mother    Kidney disease Mother    Heart disease Mother 44   Heart attack Father 50       CABG   Kidney failure Father    Hypertension Father    Heart disease Father    Arthritis Other        family hx   Breast cancer Other        relative ,50   Diabetes Other        family hx   Hypertension Other        family hx   Kidney disease Other        family hx   Lung cancer Other        family hx     Current  Outpatient Medications:    acetaminophen (TYLENOL) 500 MG tablet, Take 1,000 mg by mouth every 6 (six) hours as needed for moderate pain., Disp: , Rfl:    ALPRAZolam (XANAX) 1 MG tablet, TAKE 1 TABLET(1 MG) BY MOUTH THREE TIMES DAILY, Disp: 270 tablet, Rfl: 1   aspirin 81 MG tablet, Take 81 mg by mouth daily.  , Disp: , Rfl:    benzonatate (TESSALON) 200 MG capsule, Take 1 capsule (200 mg total) by mouth every 6 (six) hours as needed for cough., Disp: 60 capsule, Rfl: 1   cyclobenzaprine (FLEXERIL) 10 MG tablet, take 1 tablet by mouth three times a day if needed for muscle spasm, Disp: 90 tablet, Rfl: 5   escitalopram (LEXAPRO) 10 MG tablet, Take 1 tablet (10 mg total) by mouth daily., Disp: 90 tablet, Rfl: 3   hydrochlorothiazide (HYDRODIURIL) 25 MG tablet, Take 1 tablet (25 mg total) by mouth daily., Disp: 30 tablet, Rfl: 4   metoprolol succinate (TOPROL-XL) 50 MG 24 hr tablet, Take 1 tablet (50 mg total) by mouth in the  morning and at bedtime. TAKE 50 MG IN AM AND 100 MG IN PM, Disp: 60 tablet, Rfl: 0   phentermine 30 MG capsule, TAKE 1 CAPSULE BY MOUTH EVERY DAY, Disp: 30 capsule, Rfl: 5   promethazine (PHENERGAN) 25 MG tablet, TAKE 1 TABLET(25 MG) BY MOUTH EVERY 4 HOURS AS NEEDED FOR NAUSEA OR VOMITING, Disp: 60 tablet, Rfl: 5   zolpidem (AMBIEN) 10 MG tablet, TAKE 1 TABLET(10 MG) BY MOUTH AT BEDTIME AS NEEDED FOR SLEEP, Disp: 30 tablet, Rfl: 5   lisinopril (ZESTRIL) 10 MG tablet, Take 1 tablet (10 mg total) by mouth daily., Disp: 30 tablet, Rfl: 6  EXAM:  VITALS per patient if applicable:  GENERAL: alert, oriented, appears well and in no acute distress  HEENT: atraumatic, conjunttiva clear, no obvious abnormalities on inspection of external nose and ears  NECK: normal movements of the head and neck  LUNGS: on inspection no signs of respiratory distress, breathing rate appears normal, no obvious gross SOB, gasping or wheezing  CV: no obvious cyanosis  MS: moves all visible extremities without noticeable abnormality  PSYCH/NEURO: pleasant and cooperative, no obvious depression or anxiety, speech and thought processing grossly intact  ASSESSMENT AND PLAN: Pain management. Indication for chronic opioid: low back pain Medication and dose: Norco 5-325 # pills per month: 120 Last UDS date: 07-31-20 Opioid Treatment Agreement signed (Y/N): 03-22-18 Opioid Treatment Agreement last reviewed with patient:  06-18-21 NCCSRS reviewed this encounter (include red flags): Yes We will increase the # of pills to 120 per month.  Gershon Crane, MD  Discussed the following assessment and plan:  No diagnosis found.     I discussed the assessment and treatment plan with the patient. The patient was provided an opportunity to ask questions and all were answered. The patient agreed with the plan and demonstrated an understanding of the instructions.   The patient was advised to call back or seek an in-person  evaluation if the symptoms worsen or if the condition fails to improve as anticipated.      Review of Systems     Objective:   Physical Exam        Assessment & Plan:

## 2021-06-18 NOTE — Telephone Encounter (Signed)
Spoke with pt aware that her Rx for Hydrocodone was sent in to her pharmacy

## 2021-07-02 ENCOUNTER — Other Ambulatory Visit: Payer: Self-pay | Admitting: Family Medicine

## 2021-07-02 NOTE — Telephone Encounter (Signed)
Last VV-06/18/21 Last refill- 12/19/20--30 caps, 5 refills  No future office visit scheduled

## 2021-07-29 ENCOUNTER — Other Ambulatory Visit: Payer: Self-pay | Admitting: Family Medicine

## 2021-07-31 NOTE — Telephone Encounter (Signed)
Last VV- 06/18/21 Last refill-11/25/20--270 tabs, 1 refill  No future refill scheduled

## 2021-10-14 ENCOUNTER — Telehealth: Payer: Self-pay | Admitting: Family Medicine

## 2021-10-14 NOTE — Telephone Encounter (Signed)
Patient calling in with respiratory symptoms: Shortness of breath, chest pain, palpitations or other red words send to Triage  Does the patient have a fever over 100, cough, congestion, sore throat, runny nose, lost of taste/smell (please list symptoms that patient has)?Sore throat, runny nose, congestion  What date did symptoms start?Week of 12/12 (If over 5 days ago, pt may be scheduled for in person visit)  Have you tested for Covid in the last 5 days? Yes   If yes, was it positive OR negative [x] ? If positive in the last 5 days, please schedule virtual visit now. If negative, schedule for an in person OV with the next available provider if PCP has no openings. Please also let patient know they will be tested again (follow the script below)  "you will have to arrive prior to your appt time to be Covid tested. Please park in back of office at the cone & call [phone] to let the staff know you have arrived. A staff member will meet you at your car to do a rapid covid test. Once the test has resulted you will be notified by phone of your results to determine if appt will remain an in person visit or be converted to a virtual/phone visit. If you arrive [phone] than [phone] before your appt time, your visit will be automatically converted to virtual & any recommended testing will happen AFTER the visit."   THINGS TO REMEMBER  If no availability for virtual visit in office,  please schedule another [place] office  If no availability at another [place] office, please instruct patient that they can schedule an evisit or virtual visit through their mychart account. Visits up to 8pm  patients can be seen in office 5 days after positive COVID test

## 2021-10-15 ENCOUNTER — Ambulatory Visit: Payer: No Typology Code available for payment source | Admitting: Family Medicine

## 2021-10-15 ENCOUNTER — Encounter: Payer: Self-pay | Admitting: Family Medicine

## 2021-10-15 ENCOUNTER — Telehealth: Payer: Self-pay | Admitting: Family Medicine

## 2021-10-15 VITALS — BP 120/80 | HR 72 | Temp 98.0°F | Wt 167.0 lb

## 2021-10-15 DIAGNOSIS — J019 Acute sinusitis, unspecified: Secondary | ICD-10-CM

## 2021-10-15 MED ORDER — HYDROCODONE BIT-HOMATROP MBR 5-1.5 MG/5ML PO SOLN: 5.0000 mL | ORAL | 0 refills | Status: DC | PRN

## 2021-10-15 MED ORDER — LISINOPRIL 20 MG PO TABS: 20.0000 mg | ORAL_TABLET | Freq: Every day | ORAL | 3 refills | Status: DC

## 2021-10-15 MED ORDER — CYCLOBENZAPRINE HCL 10 MG PO TABS: ORAL_TABLET | ORAL | 5 refills | Status: DC

## 2021-10-15 MED ORDER — ZOLPIDEM TARTRATE 10 MG PO TABS: 10.0000 mg | ORAL_TABLET | Freq: Every evening | ORAL | 5 refills | Status: DC | PRN

## 2021-10-15 MED ORDER — DOXYCYCLINE HYCLATE 100 MG PO CAPS: 100.0000 mg | ORAL_CAPSULE | Freq: Two times a day (BID) | ORAL | 0 refills | Status: AC

## 2021-10-15 MED ORDER — PROMETHAZINE HCL 25 MG PO TABS: ORAL_TABLET | ORAL | 5 refills | Status: DC

## 2021-10-15 NOTE — Progress Notes (Signed)
° °  Subjective:    Patient ID: Caroline Boone, female    DOB: 03-Dec-1965, 56 y.o.   MRN: [id number]  HPI Here for 2 weeks of symptoms that began with body aches, ST, a dry cough, and low grade fever. These initial symptoms have resolved but now over the past week she has had sinus pressure, PND, blowing green mucus from the nose, and a dry cough. No SOB or NVD. She has tested negative for the Covid-19 virus twice. She is drinking fluids and taking Mucinex D twice daily. She has tried Benzonatate for the cough with no relief.    Review of Systems  Constitutional: Negative.   HENT:  Positive for congestion, postnasal drip and sinus pressure. Negative for ear pain and sore throat.   Eyes: Negative.   Respiratory:  Positive for cough. Negative for chest tightness, shortness of breath and wheezing.       Objective:   Physical Exam Constitutional:      Appearance: Normal appearance. She is not ill-appearing.  HENT:     Right Ear: Tympanic membrane, ear canal and external ear normal.     Left Ear: Tympanic membrane, ear canal and external ear normal.     Nose: Nose normal.     Mouth/Throat:     Pharynx: Oropharynx is clear.  Eyes:     Conjunctiva/sclera: Conjunctivae normal.  Cardiovascular:     Rate and Rhythm: Normal rate and regular rhythm.     Pulses: Normal pulses.     Heart sounds: Normal heart sounds.  Pulmonary:     Effort: Pulmonary effort is normal.     Breath sounds: Normal breath sounds.  Lymphadenopathy:     Cervical: No cervical adenopathy.  Neurological:     Mental Status: She is alert.          Assessment & Plan:  She initially had a viral URI, but now she has a sinusitis. We will treat this with Doxycycline for 10 days. Use Hycodan as needed for the cough. Gershon Crane, MD

## 2021-10-15 NOTE — Telephone Encounter (Signed)
Pt is calling and [facility] does not have HYDROcodone bit-homatropine (HYCODAN) 5-1.5 MG/5ML syrup .  Please call medication into   [id number] [place], [place] - [address] Phone:  [phone]  Fax:  [phone]

## 2021-10-15 NOTE — Telephone Encounter (Signed)
Done

## 2021-10-15 NOTE — Telephone Encounter (Signed)
Please resend. Pharmacy has been updated.  

## 2021-10-16 NOTE — Telephone Encounter (Signed)
Pharmacy will notify patient when prescription is ready for pick up.    Message was complete on 10/15/21.

## 2021-11-28 HISTORY — PX: COLONOSCOPY: SHX174

## 2021-12-16 ENCOUNTER — Other Ambulatory Visit: Payer: Self-pay | Admitting: Family Medicine

## 2021-12-18 ENCOUNTER — Ambulatory Visit (INDEPENDENT_AMBULATORY_CARE_PROVIDER_SITE_OTHER): Payer: No Typology Code available for payment source | Admitting: Family Medicine

## 2021-12-18 ENCOUNTER — Encounter: Payer: Self-pay | Admitting: Family Medicine

## 2021-12-18 VITALS — BP 110/78 | HR 63 | Temp 98.0°F | Wt 166.0 lb

## 2021-12-18 DIAGNOSIS — M545 Low back pain, unspecified: Secondary | ICD-10-CM | POA: Diagnosis not present

## 2021-12-18 DIAGNOSIS — G8929 Other chronic pain: Secondary | ICD-10-CM

## 2021-12-18 DIAGNOSIS — F1190 Opioid use, unspecified, uncomplicated: Secondary | ICD-10-CM

## 2021-12-18 MED ORDER — HYDROCODONE-ACETAMINOPHEN 5-325 MG PO TABS: 1.0000 | ORAL_TABLET | Freq: Four times a day (QID) | ORAL | 0 refills | Status: AC | PRN

## 2021-12-18 MED ORDER — HYDROCODONE-ACETAMINOPHEN 5-325 MG PO TABS: 1.0000 | ORAL_TABLET | Freq: Four times a day (QID) | ORAL | 0 refills | Status: DC | PRN

## 2021-12-18 NOTE — Progress Notes (Signed)
? ?  Subjective:  ? ? Patient ID: Caroline Boone, female    DOB: September 06, 1966, 56 y.o.   MRN: [id number] ? ?HPI ?Here for a pain management visit. She is doing well, and she is trying to use alittle pain medication as possible. She has started wearing a back brace while she is on her computer for work.  ? ? ?Review of Systems  ?Constitutional: Negative.   ?Respiratory: Negative.    ?Cardiovascular: Negative.   ?Musculoskeletal:  Positive for back pain.  ? ?   ?Objective:  ? Physical Exam ?Constitutional:   ?   Appearance: Normal appearance.  ?Cardiovascular:  ?   Rate and Rhythm: Normal rate and regular rhythm.  ?   Pulses: Normal pulses.  ?   Heart sounds: Normal heart sounds.  ?Pulmonary:  ?   Effort: Pulmonary effort is normal.  ?   Breath sounds: Normal breath sounds.  ?Musculoskeletal:  ?   Comments: Lower back is mildly tender and has reduced flexion.extension due to pain.   ?Neurological:  ?   Mental Status: She is alert.  ? ? ? ? ? ?   ?Assessment & Plan:  ?Pain management.  ?Indication for chronic opioid: low back pain ?Medication and dose: Norco 5-325 ?# pills per month: 120 ?Last UDS date: 12-18-21 ?Opioid Treatment Agreement signed (Y/N): 03-22-18 ?Opioid Treatment Agreement last reviewed with patient:  12-18-21 ?NCCSRS reviewed this encounter (include red flags): Yes ?Meds were refilled.  ?Gershon Crane, MD ? ? ?

## 2021-12-19 LAB — DRUG MONITOR, PANEL 1, W/CONF, URINE
Amphetamines: NEGATIVE ng/mL (ref <500)
Barbiturates: NEGATIVE ng/mL (ref <300)
Benzodiazepines: NEGATIVE ng/mL (ref <100)
Cocaine Metabolite: NEGATIVE ng/mL (ref <150)
Creatinine: 21.1 mg/dL (ref >=20.0)
Marijuana Metabolite: NEGATIVE ng/mL (ref <20)
Methadone Metabolite: NEGATIVE ng/mL (ref <100)
Opiates: NEGATIVE ng/mL (ref <100)
Oxidant: NEGATIVE ug/mL (ref <200)
Oxycodone: NEGATIVE ng/mL (ref <100)
pH: 7.3 (ref 4.5–9.0)

## 2021-12-19 LAB — DM TEMPLATE

## 2021-12-25 ENCOUNTER — Other Ambulatory Visit: Payer: Self-pay

## 2021-12-25 ENCOUNTER — Other Ambulatory Visit: Payer: Self-pay | Admitting: Family Medicine

## 2021-12-25 ENCOUNTER — Telehealth: Payer: Self-pay | Admitting: Family Medicine

## 2021-12-25 DIAGNOSIS — F418 Other specified anxiety disorders: Secondary | ICD-10-CM

## 2021-12-25 MED ORDER — ESCITALOPRAM OXALATE 10 MG PO TABS: 10.0000 mg | ORAL_TABLET | Freq: Every day | ORAL | 0 refills | Status: DC

## 2021-12-25 NOTE — Telephone Encounter (Signed)
Refill sent to pharmacy.   

## 2021-12-25 NOTE — Telephone Encounter (Signed)
Pt is calling and needs PA for escitalopram (LEXAPRO) 10 MG tablet  ?[id number] [place],  - [address] AT [facility] [address] Phone:  [phone]  ?Fax:  [phone]  ?  ? ?

## 2021-12-25 NOTE — Telephone Encounter (Signed)
Patient called in requesting a refill for this medication. ? ?Please advise. ?

## 2021-12-25 NOTE — Telephone Encounter (Signed)
Called insurance company to initiate prior authorization, no PA was required.   Left message for patient with this information.   ?

## 2021-12-29 NOTE — Telephone Encounter (Signed)
Patient called in requesting a refill for this medication. Patient stated that the pharmacy stated that there weren't any refills for this medication there. ? ?Please advise. ?

## 2021-12-30 MED ORDER — ESCITALOPRAM OXALATE 10 MG PO TABS: 10.0000 mg | ORAL_TABLET | Freq: Every day | ORAL | 1 refills | Status: DC

## 2021-12-30 NOTE — Telephone Encounter (Addendum)
LVM for patient that Escitalopram 10mg  has been sent to [facility] pharmacy. ?

## 2021-12-30 NOTE — Addendum Note (Signed)
Addended by: Milus Mallick on: 12/30/2021 09:44 AM ? ? Modules accepted: Orders ? ?

## 2022-02-26 ENCOUNTER — Other Ambulatory Visit: Payer: Self-pay | Admitting: Family Medicine

## 2022-02-27 NOTE — Telephone Encounter (Signed)
Last OV- 12/18/2021 ?Last refill- 06/24/2021--30 capsules, 5 refills ? ?No future OV scheduled. ? ? ?

## 2022-03-18 ENCOUNTER — Other Ambulatory Visit: Payer: Self-pay | Admitting: Family Medicine

## 2022-03-19 NOTE — Telephone Encounter (Signed)
Last OV- 12/18/2021 Last refill- 07/31/21--270 tabs, 1 refill  No future OV scheduled.   Can this patient receive a refill?

## 2022-04-02 ENCOUNTER — Other Ambulatory Visit: Payer: Self-pay | Admitting: Family Medicine

## 2022-04-03 NOTE — Telephone Encounter (Signed)
Last refill- 10/15/21--30 tabs, 5 refills Last OV- 12/18/21  No future OV scheduled.

## 2022-04-28 ENCOUNTER — Other Ambulatory Visit: Payer: Self-pay | Admitting: Family Medicine

## 2022-04-28 DIAGNOSIS — F418 Other specified anxiety disorders: Secondary | ICD-10-CM

## 2022-06-05 ENCOUNTER — Encounter: Payer: Self-pay | Admitting: Family Medicine

## 2022-06-05 NOTE — Telephone Encounter (Signed)
Pharmacy updated to [facility]. 

## 2022-06-05 NOTE — Telephone Encounter (Signed)
Call in Omeprazole 20 mg daily, #90 with 3 rf. She will need an OV to discuss [place]

## 2022-06-08 ENCOUNTER — Other Ambulatory Visit: Payer: Self-pay

## 2022-06-08 DIAGNOSIS — K219 Gastro-esophageal reflux disease without esophagitis: Secondary | ICD-10-CM

## 2022-06-08 MED ORDER — OMEPRAZOLE 20 MG PO CPDR: 20.0000 mg | DELAYED_RELEASE_CAPSULE | Freq: Every day | ORAL | 3 refills | Status: DC

## 2022-06-24 ENCOUNTER — Encounter: Payer: Self-pay | Admitting: Family Medicine

## 2022-06-24 ENCOUNTER — Ambulatory Visit (INDEPENDENT_AMBULATORY_CARE_PROVIDER_SITE_OTHER): Payer: Managed Care, Other (non HMO) | Admitting: Family Medicine

## 2022-06-24 VITALS — BP 120/80 | HR 94 | Temp 97.7°F | Wt 171.2 lb

## 2022-06-24 DIAGNOSIS — G8929 Other chronic pain: Secondary | ICD-10-CM

## 2022-06-24 DIAGNOSIS — M810 Age-related osteoporosis without current pathological fracture: Secondary | ICD-10-CM

## 2022-06-24 DIAGNOSIS — G43909 Migraine, unspecified, not intractable, without status migrainosus: Secondary | ICD-10-CM

## 2022-06-24 DIAGNOSIS — F1190 Opioid use, unspecified, uncomplicated: Secondary | ICD-10-CM

## 2022-06-24 DIAGNOSIS — M545 Low back pain, unspecified: Secondary | ICD-10-CM | POA: Diagnosis not present

## 2022-06-24 MED ORDER — UBRELVY 50 MG PO TABS: 50.0000 mg | ORAL_TABLET | ORAL | 2 refills | Status: DC | PRN

## 2022-06-24 MED ORDER — HYDROCODONE-ACETAMINOPHEN 5-325 MG PO TABS: 1.0000 | ORAL_TABLET | Freq: Four times a day (QID) | ORAL | 0 refills | Status: DC | PRN

## 2022-06-24 MED ORDER — ALENDRONATE SODIUM 70 MG PO TABS
70.0000 mg | ORAL_TABLET | ORAL | 0 refills | Status: DC
Start: 2022-06-24 — End: 2023-09-14

## 2022-06-24 MED ORDER — HYDROCODONE-ACETAMINOPHEN 5-325 MG PO TABS: 1.0000 | ORAL_TABLET | Freq: Four times a day (QID) | ORAL | 0 refills | Status: AC | PRN

## 2022-06-24 NOTE — Progress Notes (Signed)
   Subjective:    Patient ID: Caroline Boone, female    DOB: Dec 04, 1965, 56 y.o.   MRN: [id number]  HPI Here for pain management. Her back pain has been fairly stable. She has good days and bad days. She is trying to walk 1-2 miles a day now. She also asks to try Ubrelvy for her migraines. She is not able to take triptans because they make her heart race.    Review of Systems  Constitutional: Negative.   Respiratory: Negative.    Cardiovascular: Negative.   Musculoskeletal:  Positive for back pain.  Neurological:  Positive for headaches.       Objective:   Physical Exam Constitutional:      Appearance: Normal appearance.  Neurological:     Mental Status: She is alert.           Assessment & Plan:  Pain management. Indication for chronic opioid: low back pain Medication and dose: Norco 5-325 # pills per month: 120 Last UDS date: 12-18-21 Opioid Treatment Agreement signed (Y/N): 03-22-18 Opioid Treatment Agreement last reviewed with patient:  06-24-22 NCCSRS reviewed this encounter (include red flags): Yes We refilled the Norco. She will also try Ubrelvy 50 mg as needed for migraines.  Gershon Crane, MD

## 2022-09-02 ENCOUNTER — Telehealth: Payer: Self-pay | Admitting: Family Medicine

## 2022-09-02 NOTE — Telephone Encounter (Addendum)
Pt called to say she has been going back and forth with the pharmacy and they are still telling her they are still waiting for the Prior Authorization for the Ubrogepant (UBRELVY) 50 MG TABS  Pt is completely out of this medication x one week and she is very frustrated.  PA Needed ASAP!!    Preferred Pharmacies   [id number] - [place],  - [address] AT [facility] [address] Phone: [phone]  Fax: [phone]     LOV:  06/24/22

## 2022-09-02 NOTE — Telephone Encounter (Signed)
Your information has been submitted and will be reviewed by [place]. An electronic determination will be received  within 72-120 hours. You will receive a fax copy of the determination. If Rosann Auerbach has not responded in 120 hours, contact [employer] at [phone].

## 2022-09-03 NOTE — Telephone Encounter (Signed)
Prior authorization approved for [place] . Called patient made aware of approval.     PA Case ID: [id number] Outcome Approved on November 15 CaseId:[id number] ;Status:Approved;Review Type:Prior Auth;Coverage Start Date:09/02/2022;Coverage End Date:09/02/2023

## 2022-09-15 ENCOUNTER — Other Ambulatory Visit: Payer: Self-pay | Admitting: Family Medicine

## 2022-09-15 NOTE — Telephone Encounter (Signed)
Last refill-04/03/22--30 tabs, 5 refills Last OV-06/24/22  No future OV scheduled.

## 2022-09-24 ENCOUNTER — Encounter: Payer: Self-pay | Admitting: Family Medicine

## 2022-09-25 MED ORDER — UBRELVY 100 MG PO TABS: 1.0000 | ORAL_TABLET | ORAL | 11 refills | Status: DC | PRN

## 2022-09-25 NOTE — Telephone Encounter (Signed)
Done

## 2022-10-10 ENCOUNTER — Other Ambulatory Visit: Payer: Self-pay | Admitting: Family Medicine

## 2022-10-10 DIAGNOSIS — F418 Other specified anxiety disorders: Secondary | ICD-10-CM

## 2022-10-12 NOTE — Telephone Encounter (Signed)
Last OV 06/24/22 Next OV: none scheduled

## 2022-10-17 ENCOUNTER — Other Ambulatory Visit: Payer: Self-pay | Admitting: Family Medicine

## 2022-10-19 ENCOUNTER — Other Ambulatory Visit: Payer: Self-pay | Admitting: Family Medicine

## 2022-10-20 NOTE — Telephone Encounter (Signed)
Last OV: 06/24/22 Next OV: none scheduled Last refill per [facility] 10/15/21

## 2022-10-21 ENCOUNTER — Encounter: Payer: Self-pay | Admitting: Family Medicine

## 2022-10-21 NOTE — Telephone Encounter (Signed)
Pt called to FU on this refill.  

## 2022-10-21 NOTE — Telephone Encounter (Signed)
Pt LOV was on 06/24/2022 Last refill was done on 03/19/2022 Please advise

## 2022-10-21 NOTE — Telephone Encounter (Signed)
Last OV PMV-06/24/22 Last refill-03/19/22--270 tabs, 1 refill  No future OV scheduled.

## 2022-10-22 MED ORDER — ALPRAZOLAM 1 MG PO TABS: ORAL_TABLET | ORAL | 1 refills | Status: DC

## 2022-10-22 NOTE — Telephone Encounter (Signed)
Done

## 2022-11-02 ENCOUNTER — Encounter: Payer: Self-pay | Admitting: Family Medicine

## 2022-11-06 ENCOUNTER — Ambulatory Visit: Payer: Managed Care, Other (non HMO) | Admitting: Family Medicine

## 2022-11-06 ENCOUNTER — Encounter: Payer: Self-pay | Admitting: Family Medicine

## 2022-11-06 ENCOUNTER — Ambulatory Visit (INDEPENDENT_AMBULATORY_CARE_PROVIDER_SITE_OTHER)
Admission: RE | Admit: 2022-11-06 | Discharge: 2022-11-06 | Disposition: A | Discharge status: Home / Self Care | Payer: Managed Care, Other (non HMO) | Source: Ambulatory Visit | Attending: Family Medicine | Admitting: Family Medicine

## 2022-11-06 VITALS — BP 126/82 | HR 87 | Temp 98.0°F | Ht 62.0 in | Wt 173.0 lb

## 2022-11-06 DIAGNOSIS — S335XXA Sprain of ligaments of lumbar spine, initial encounter: Secondary | ICD-10-CM

## 2022-11-06 DIAGNOSIS — F1190 Opioid use, unspecified, uncomplicated: Secondary | ICD-10-CM

## 2022-11-06 DIAGNOSIS — G8929 Other chronic pain: Secondary | ICD-10-CM

## 2022-11-06 DIAGNOSIS — M545 Low back pain, unspecified: Secondary | ICD-10-CM | POA: Diagnosis not present

## 2022-11-06 MED ORDER — HYDROCODONE-ACETAMINOPHEN 10-325 MG PO TABS: 1.0000 | ORAL_TABLET | Freq: Four times a day (QID) | ORAL | 0 refills | Status: DC | PRN

## 2022-11-06 MED ORDER — HYDROCODONE-ACETAMINOPHEN 10-325 MG PO TABS: 1.0000 | ORAL_TABLET | Freq: Four times a day (QID) | ORAL | 0 refills | Status: AC | PRN

## 2022-11-06 NOTE — Progress Notes (Signed)
   Subjective:    Patient ID: Caroline Boone, female    DOB: 06-25-1966, 57 y.o.   MRN: [id number]  HPI Here for pain management. Over the past few months her low back pain has gotten worse, and her pain medication no longer helps like it used to. Also she fell down some steps about 2 weeks ago, landing on her bottom. This caused a well localized sharp pain in the lower back. No radiation to the legs.    Review of Systems  Constitutional: Negative.   Respiratory: Negative.    Cardiovascular: Negative.   Musculoskeletal:  Positive for back pain.       Objective:   Physical Exam Constitutional:      General: She is not in acute distress.    Appearance: Normal appearance.  Cardiovascular:     Rate and Rhythm: Normal rate and regular rhythm.     Pulses: Normal pulses.     Heart sounds: Normal heart sounds.  Pulmonary:     Breath sounds: Normal breath sounds.  Musculoskeletal:     Comments: She is tender over the lower back with full ROM and negative SLR on both sides   Neurological:     Mental Status: She is alert.           Assessment & Plan:  Pain management. She also has acute pain from a recent fall. We will send her for Xrays of the lumbar spine to rule out a compression fracture.  Indication for chronic opioid: low back pain  Medication and dose: Norco 10-325 # pills per month: 120 Last UDS date: 12-18-21 Opioid Treatment Agreement signed (Y/N): 03-22-18 Opioid Treatment Agreement last reviewed with patient:  11-06-22 NCCSRS reviewed this encounter (include red flags): Yes We will increase the Norco from 5-325 to 10-325 QID.  Alysia Penna, MD

## 2022-12-14 ENCOUNTER — Encounter: Payer: Self-pay | Admitting: Family Medicine

## 2022-12-15 NOTE — Telephone Encounter (Signed)
Have her make an OV to see me

## 2023-03-07 ENCOUNTER — Other Ambulatory Visit: Payer: Self-pay | Admitting: Family Medicine

## 2023-03-08 ENCOUNTER — Encounter: Payer: Self-pay | Admitting: Family Medicine

## 2023-03-08 NOTE — Telephone Encounter (Signed)
Pt LOV was on 11/06/22 Last refill was done on 09/15/2022 Please advise

## 2023-03-19 ENCOUNTER — Other Ambulatory Visit: Payer: Self-pay | Admitting: Family Medicine

## 2023-05-07 ENCOUNTER — Encounter: Payer: Self-pay | Admitting: Family Medicine

## 2023-05-07 ENCOUNTER — Ambulatory Visit: Payer: Managed Care, Other (non HMO) | Admitting: Family Medicine

## 2023-05-07 VITALS — BP 136/90 | HR 79 | Temp 98.4°F | Wt 174.2 lb

## 2023-05-07 DIAGNOSIS — J019 Acute sinusitis, unspecified: Secondary | ICD-10-CM

## 2023-05-07 MED ORDER — LEVOFLOXACIN 500 MG PO TABS: 500.0000 mg | ORAL_TABLET | Freq: Every day | ORAL | 0 refills | Status: AC

## 2023-05-07 MED ORDER — METHYLPREDNISOLONE ACETATE 40 MG/ML IJ SUSP
40.0000 mg | Freq: Once | INTRAMUSCULAR | Status: AC
  Administered 2023-05-07: 40 mg via INTRAMUSCULAR

## 2023-05-07 MED ORDER — HYDROCODONE BIT-HOMATROP MBR 5-1.5 MG/5ML PO SOLN: 5.0000 mL | ORAL | 0 refills | Status: DC | PRN

## 2023-05-07 MED ORDER — METHYLPREDNISOLONE ACETATE 80 MG/ML IJ SUSP
80.0000 mg | Freq: Once | INTRAMUSCULAR | Status: AC
  Administered 2023-05-07: 80 mg via INTRAMUSCULAR

## 2023-05-07 NOTE — Addendum Note (Signed)
Addended by: Carola Rhine on: 05/07/2023 03:05 PM   Modules accepted: Orders

## 2023-05-07 NOTE — Progress Notes (Signed)
   Subjective:    Patient ID: Caroline Boone, female    DOB: 05-07-1966, 57 y.o.   MRN: [id number]  HPI Here for 2 weeks of sinus pressure, PND, facial pain, and a dry cough. No fever. Taking Mucinex D. She tested negative for Covid yesterday.   Review of Systems  Constitutional: Negative.   HENT:  Positive for congestion, facial swelling and postnasal drip. Negative for ear pain.   Eyes: Negative.   Respiratory:  Positive for cough. Negative for shortness of breath and wheezing.        Objective:   Physical Exam Constitutional:      Appearance: Normal appearance.  HENT:     Head: Normocephalic and atraumatic.     Right Ear: Tympanic membrane, ear canal and external ear normal.     Left Ear: Tympanic membrane, ear canal and external ear normal.     Nose: Nose normal.     Mouth/Throat:     Pharynx: Oropharynx is clear.  Pulmonary:     Effort: Pulmonary effort is normal.     Breath sounds: Normal breath sounds.  Lymphadenopathy:     Cervical: No cervical adenopathy.  Neurological:     Mental Status: She is alert.           Assessment & Plan:  Sinusitis, treat with 10 days of Levaquin. Add Hycodan as needed for the cough. Given a shot of DepMedrol.  Gershon Crane, MD

## 2023-05-19 ENCOUNTER — Encounter: Payer: Self-pay | Admitting: Family Medicine

## 2023-05-21 ENCOUNTER — Encounter: Payer: Self-pay | Admitting: Family Medicine

## 2023-05-21 ENCOUNTER — Telehealth: Payer: BC Managed Care – PPO | Admitting: Family Medicine

## 2023-05-21 VITALS — BP 124/91 | Ht 62.0 in | Wt 170.0 lb

## 2023-05-21 DIAGNOSIS — G8929 Other chronic pain: Secondary | ICD-10-CM

## 2023-05-21 DIAGNOSIS — M545 Low back pain, unspecified: Secondary | ICD-10-CM

## 2023-05-21 DIAGNOSIS — F1190 Opioid use, unspecified, uncomplicated: Secondary | ICD-10-CM | POA: Diagnosis not present

## 2023-05-21 MED ORDER — HYDROCODONE-ACETAMINOPHEN 10-325 MG PO TABS: 1.0000 | ORAL_TABLET | Freq: Four times a day (QID) | ORAL | 0 refills | Status: DC | PRN

## 2023-05-21 NOTE — Progress Notes (Signed)
Subjective:    Patient ID: Caroline Boone, female    DOB: 1966/01/26, 57 y.o.   MRN: [id number]  HPI Virtual Visit via Video Note  I connected with the patient on 05/21/23 at  8:00 AM EDT by a video enabled telemedicine application and verified that I am speaking with the correct person using two identifiers.  Location patient: home Location provider:work or home office Persons participating in the virtual visit: patient, provider  I discussed the limitations of evaluation and management by telemedicine and the availability of in person appointments. The patient expressed understanding and agreed to proceed.   HPI: Here for pain management. She is doing well.    ROS: See pertinent positives and negatives per HPI.  Past Medical History:  Diagnosis Date   Anxiety    Carotid artery injury    Carotid stenosis    Dopplers 6/12:0-39% bilateral; distal ICAs with marked tortuosity; CT angiogram recommended;  followed at [facility]; surgery to correct LICA stenosis and pseudoaneurysm too risky - > medical Rx;  dopplers 8/13: B/L 0-39% (stable)   Cerebral aneurysm    Chest pain    GXT echo 8/11: normal LVF, no ischemia;  h/o neg. myoview in 2005   Family history of adverse reaction to anesthesia    MOMTHER HAD TROUBLE WAKING   Fibromuscular dysplasia (HCC)    affects left vertebral,  artery, left ICA, left renal artery;  head and neck CTA 10/24/11: stable pattern of fibromuscular dysplasia of ICA and VA with distal changes suggesting prior pseudoaneurysm and dissection; L dist ICA 60%; normal MRI 10/29/11;  followed at [facility] with serial CT scans   GERD (gastroesophageal reflux disease)    Headache    History of cardiac cath 2007   cath 11/25/05 by Dr. Jenne Campus with normal cors and EF 50%   Hypertension    a. 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 Prescott Parma  at [facility]     Past Surgical History:  Procedure Laterality Date   BREAST ENHANCEMENT SURGERY  1991   Bilateral breast    COLONOSCOPY  04/27/2014   per Dr. Loreta Ave, tubular adenomas, repeat in 5 yrs    LEFT HEART CATH AND CORONARY ANGIOGRAPHY N/A 01/12/2018   Procedure: LEFT HEART CATH AND CORONARY ANGIOGRAPHY;  Surgeon: [place], Peter M, MD;  Location: [facility] INVASIVE CV LAB;  Service: Cardiovascular;  Laterality: N/A;    Family History  Problem Relation Age of Onset   Coronary artery disease Mother    Kidney disease Mother    Heart disease Mother 59   Heart attack Father 7       CABG   Kidney failure Father    Hypertension Father    Heart disease Father    Arthritis Other        family hx   Breast cancer Other        relative ,50   Diabetes Other        family hx   Hypertension Other        family hx   Kidney disease Other        family hx   Lung cancer Other        family hx     Current Outpatient Medications:    acetaminophen (TYLENOL) 500 MG tablet, Take 1,000 mg by mouth every 6 (six)  hours as needed for moderate pain., Disp: , Rfl:    alendronate (FOSAMAX) 70 MG tablet, Take 1 tablet (70 mg total) by mouth once a week. Take with a full glass of water on an empty stomach., Disp: 4 tablet, Rfl: 0   ALPRAZolam (XANAX) 1 MG tablet, TAKE 1 TABLET(1 MG) BY MOUTH THREE TIMES DAILY, Disp: 270 tablet, Rfl: 1   aspirin 81 MG tablet, Take 81 mg by mouth daily.  , Disp: , Rfl:    cyclobenzaprine (FLEXERIL) 10 MG tablet, TAKE 1 TABLET BY MOUTH THREE TIMES DAILY AS NEEDED FOR MUSCLE SPASM, Disp: 90 tablet, Rfl: 5   escitalopram (LEXAPRO) 10 MG tablet, TAKE 1 TABLET(10 MG) BY MOUTH DAILY, Disp: 90 tablet, Rfl: 3   hydrochlorothiazide (HYDRODIURIL) 25 MG tablet, Take 1 tablet (25 mg total) by mouth daily., Disp: 30 tablet, Rfl: 4   HYDROcodone-acetaminophen (NORCO) 10-325 MG tablet, Take 1 tablet by mouth every 6 (six) hours as needed., Disp: , Rfl:    lisinopril (ZESTRIL) 20 MG tablet, Take 1  tablet (20 mg total) by mouth daily., Disp: 90 tablet, Rfl: 3   metoprolol succinate (TOPROL-XL) 50 MG 24 hr tablet, Take 1 tablet (50 mg total) by mouth in the morning and at bedtime. TAKE 50 MG IN AM AND 100 MG IN PM, Disp: 60 tablet, Rfl: 0   promethazine (PHENERGAN) 25 MG tablet, TAKE 1 TABLET(25 MG) BY MOUTH EVERY 4 HOURS AS NEEDED FOR NAUSEA OR VOMITING, Disp: 60 tablet, Rfl: 5   Ubrogepant (UBRELVY) 100 MG TABS, Take 1 tablet by mouth as needed (migraines)., Disp: 30 tablet, Rfl: 11   zolpidem (AMBIEN) 10 MG tablet, TAKE 1 TABLET(10 MG) BY MOUTH AT BEDTIME AS NEEDED FOR SLEEP, Disp: 30 tablet, Rfl: 5  EXAM:  VITALS per patient if applicable:  GENERAL: alert, oriented, appears well and in no acute distress  HEENT: atraumatic, conjunttiva clear, no obvious abnormalities on inspection of external nose and ears  NECK: normal movements of the head and neck  LUNGS: on inspection no signs of respiratory distress, breathing rate appears normal, no obvious gross SOB, gasping or wheezing  CV: no obvious cyanosis  MS: moves all visible extremities without noticeable abnormality  PSYCH/NEURO: pleasant and cooperative, no obvious depression or anxiety, speech and thought processing grossly intact  ASSESSMENT AND PLAN: Pain management.  Indication for chronic opioid: low back pain Medication and dose: Norco 10-325 # pills per month: 120 Last UDS date: 05-21-23 Opioid Treatment Agreement signed (Y/N): 03-22-18 Opioid Treatment Agreement last reviewed with patient:  05-21-23 NCCSRS reviewed this encounter (include red flags): Yes Meds were refilled. Gershon Crane, MD  Discussed the following assessment and plan:  No diagnosis found.     I discussed the assessment and treatment plan with the patient. The patient was provided an opportunity to ask questions and all were answered. The patient agreed with the plan and demonstrated an understanding of the instructions.   The patient was advised  to call back or seek an in-person evaluation if the symptoms worsen or if the condition fails to improve as anticipated.      Review of Systems     Objective:   Physical Exam        Assessment & Plan:

## 2023-05-24 ENCOUNTER — Encounter: Payer: Self-pay | Admitting: Family Medicine

## 2023-05-25 ENCOUNTER — Other Ambulatory Visit: Payer: Self-pay | Admitting: Family Medicine

## 2023-05-26 ENCOUNTER — Telehealth: Payer: Self-pay | Admitting: Pharmacy Technician

## 2023-05-26 ENCOUNTER — Other Ambulatory Visit (HOSPITAL_COMMUNITY): Payer: Self-pay

## 2023-05-26 ENCOUNTER — Encounter: Payer: Self-pay | Admitting: Family Medicine

## 2023-05-26 NOTE — Telephone Encounter (Signed)
Pharmacy Patient Advocate Encounter  Received notification from [facility] that Prior Authorization for Hydrocodone-Acetaminophen has been APPROVED from 05/26/2023 to 06/25/2023   PA #/Case ID/Reference #: [id number]

## 2023-05-26 NOTE — Telephone Encounter (Signed)
Patient is aware 

## 2023-05-26 NOTE — Telephone Encounter (Signed)
Pharmacy Patient Advocate Encounter   Received notification from CoverMyMeds that prior authorization for HYDROcodone-Acetaminophen 10-325MG  tablets is required/requested.   Insurance verification completed.   The patient is insured through [facility] .   Per test claim: PA required; PA [place] via CoverMyMeds Key/confirmation #/EOC [id number] Status is pending

## 2023-05-27 ENCOUNTER — Other Ambulatory Visit (HOSPITAL_COMMUNITY): Payer: Self-pay

## 2023-05-31 DIAGNOSIS — H18831 Recurrent erosion of cornea, right eye: Secondary | ICD-10-CM | POA: Diagnosis not present

## 2023-06-29 ENCOUNTER — Other Ambulatory Visit: Payer: Self-pay | Admitting: Family Medicine

## 2023-08-27 ENCOUNTER — Other Ambulatory Visit: Payer: Self-pay | Admitting: Family Medicine

## 2023-09-08 ENCOUNTER — Other Ambulatory Visit (HOSPITAL_COMMUNITY): Payer: Self-pay

## 2023-09-08 ENCOUNTER — Telehealth: Payer: Self-pay | Admitting: Pharmacy Technician

## 2023-09-08 NOTE — Telephone Encounter (Signed)
Pharmacy Patient Advocate Encounter   Received notification from CoverMyMeds that prior authorization for Ubrelvy 100MG  tablets is required/requested.   Insurance verification completed.   The patient is insured through [facility] .   Per test claim: PA required; PA submitted to above mentioned insurance via CoverMyMeds Key/confirmation #/EOC [id number] Status is pending

## 2023-09-09 ENCOUNTER — Other Ambulatory Visit (HOSPITAL_COMMUNITY): Payer: Self-pay

## 2023-09-09 NOTE — Telephone Encounter (Signed)
Pharmacy Patient Advocate Encounter  Received notification from [facility] that Prior Authorization for Ubrelvy 100mg  has been APPROVED from 09/08/2023 to 09/07/24. Ran test claim, Copay is $0.00. This test claim was processed through [facility]- copay amounts may vary at other pharmacies due to pharmacy/plan contracts, or as the patient moves through the different stages of their insurance plan.   PA #/Case ID/Reference #: [id number]

## 2023-09-14 ENCOUNTER — Emergency Department (HOSPITAL_COMMUNITY): Payer: BC Managed Care – PPO

## 2023-09-14 ENCOUNTER — Emergency Department (HOSPITAL_COMMUNITY)
Admission: EM | Admit: 2023-09-14 | Discharge: 2023-09-14 | Disposition: A | Discharge status: Home / Self Care | Payer: BC Managed Care – PPO | Attending: Emergency Medicine | Admitting: Emergency Medicine

## 2023-09-14 ENCOUNTER — Other Ambulatory Visit: Payer: Self-pay

## 2023-09-14 ENCOUNTER — Encounter (HOSPITAL_COMMUNITY): Payer: Self-pay

## 2023-09-14 DIAGNOSIS — R079 Chest pain, unspecified: Secondary | ICD-10-CM | POA: Diagnosis not present

## 2023-09-14 DIAGNOSIS — I10 Essential (primary) hypertension: Secondary | ICD-10-CM | POA: Diagnosis not present

## 2023-09-14 DIAGNOSIS — Z7982 Long term (current) use of aspirin: Secondary | ICD-10-CM | POA: Insufficient documentation

## 2023-09-14 DIAGNOSIS — K449 Diaphragmatic hernia without obstruction or gangrene: Secondary | ICD-10-CM | POA: Diagnosis not present

## 2023-09-14 DIAGNOSIS — I726 Aneurysm of vertebral artery: Secondary | ICD-10-CM | POA: Diagnosis not present

## 2023-09-14 DIAGNOSIS — Z79899 Other long term (current) drug therapy: Secondary | ICD-10-CM | POA: Diagnosis not present

## 2023-09-14 DIAGNOSIS — I6529 Occlusion and stenosis of unspecified carotid artery: Secondary | ICD-10-CM | POA: Diagnosis not present

## 2023-09-14 DIAGNOSIS — I517 Cardiomegaly: Secondary | ICD-10-CM | POA: Diagnosis not present

## 2023-09-14 LAB — TROPONIN I (HIGH SENSITIVITY)
Troponin I (High Sensitivity): 3 ng/L (ref <18)
Troponin I (High Sensitivity): 3 ng/L (ref <18)

## 2023-09-14 LAB — BASIC METABOLIC PANEL
Anion gap: 8 (ref 5–15)
BUN: <5 mg/dL — ABNORMAL LOW (ref 6–20)
CO2: 26 mmol/L (ref 22–32)
Calcium: 9.2 mg/dL (ref 8.9–10.3)
Chloride: 104 mmol/L (ref 98–111)
Creatinine, Ser: 0.53 mg/dL (ref 0.44–1.00)
GFR, Estimated: >60 mL/min (ref >60)
Glucose, Bld: 114 mg/dL — ABNORMAL HIGH (ref 70–99)
Potassium: 3.4 mmol/L — ABNORMAL LOW (ref 3.5–5.1)
Sodium: 138 mmol/L (ref 135–145)

## 2023-09-14 LAB — CBC
HCT: 39.6 % (ref 36.0–46.0)
Hemoglobin: 13 g/dL (ref 12.0–15.0)
MCH: 30 pg (ref 26.0–34.0)
MCHC: 32.8 g/dL (ref 30.0–36.0)
MCV: 91.5 fL (ref 80.0–100.0)
Platelets: 334 10*3/uL (ref 150–400)
RBC: 4.33 MIL/uL (ref 3.87–5.11)
RDW: 12.1 % (ref 11.5–15.5)
WBC: 6.5 10*3/uL (ref 4.0–10.5)
nRBC: 0 % (ref 0.0–0.2)

## 2023-09-14 LAB — D-DIMER, QUANTITATIVE: D-Dimer, Quant: 0.74 ug{FEU}/mL — ABNORMAL HIGH (ref 0.00–0.50)

## 2023-09-14 MED ORDER — IOHEXOL 350 MG/ML SOLN
75.0000 mL | Freq: Once | INTRAVENOUS | Status: AC | PRN
  Administered 2023-09-14: 75 mL via INTRAVENOUS

## 2023-09-14 MED ORDER — IOHEXOL 350 MG/ML SOLN
61.0000 mL | Freq: Once | INTRAVENOUS | Status: AC | PRN
  Administered 2023-09-14: 61 mL via INTRAVENOUS

## 2023-09-14 MED ORDER — KETOROLAC TROMETHAMINE 15 MG/ML IJ SOLN
15.0000 mg | Freq: Once | INTRAMUSCULAR | Status: AC
  Administered 2023-09-14: 15 mg via INTRAVENOUS
  Filled 2023-09-14: qty 1

## 2023-09-14 MED ORDER — ACETAMINOPHEN 500 MG PO TABS
1000.0000 mg | ORAL_TABLET | Freq: Once | ORAL | Status: AC
  Administered 2023-09-14: 1000 mg via ORAL
  Filled 2023-09-14: qty 2

## 2023-09-14 NOTE — ED Triage Notes (Addendum)
Pt to ED via pov from home. Pt states her BP and HR have been high for the past week. Pt states her BP was 168/110, HR = 110s.  Pt states she has been short of breath  and fatigued starting yesterday. Pt c/o pain in left chest radiating into back. Pt states she has a left carotid dissection that is being medically managed including BP control.

## 2023-09-14 NOTE — Discharge Instructions (Addendum)
We evaluated you for your chest pain.  Your testing was reassuring.  We did not see any sign of a blood clot, heart attack, brain bleed, or problem with your blood vessels.  Please call your primary doctor for follow-up.  You may need some medication adjustments to help better control your blood pressure.  Please return if you have any new or worsening symptoms such as uncontrolled pain, numbness or tingling, weakness, vision changes, trouble speaking, or any other new symptoms.

## 2023-09-14 NOTE — ED Provider Notes (Signed)
EMERGENCY DEPARTMENT AT [facility] Provider Note  CSN: [id number] Arrival date & time: 09/14/23 1433  Chief Complaint(s) Hypertension  HPI Jayelyn Banther is a 57 y.o. female of left carotid dissection, hypertension presenting to the emergency department with high blood pressure.  She reports that she checked her blood pressure at work today and was elevated up to 160s and she was worried about this.  She also reports some elevated heart rates at home.  She reports mild shortness of breath.  She does report some mild chest discomfort that comes and goes.  No recent travel or surgeries.  No fevers or chills.  No cough.  No dysuria.  No nausea or vomiting.  No abdominal pain.  She reports she is taking her blood pressure medications as prescribed.  She reports she was primarily concerned about the blood pressure.  She also reports that she has a headache, does typically get headaches send today's headache is somewhat similar but also somewhat different.  No numbness or tingling, visual disturbances, weakness, trouble walking, or any other new symptoms.   Past Medical History Past Medical History:  Diagnosis Date   Anxiety    Carotid artery injury    Carotid stenosis    Dopplers 6/12:0-39% bilateral; distal ICAs with marked tortuosity; CT angiogram recommended;  followed at [facility]; surgery to correct LICA stenosis and pseudoaneurysm too risky - > medical Rx;  dopplers 8/13: B/L 0-39% (stable)   Cerebral aneurysm    Chest pain    GXT echo 8/11: normal LVF, no ischemia;  h/o neg. myoview in 2005   Family history of adverse reaction to anesthesia    MOMTHER HAD TROUBLE WAKING   Fibromuscular dysplasia (HCC)    affects left vertebral,  artery, left ICA, left renal artery;  head and neck CTA 10/24/11: stable pattern of fibromuscular dysplasia of ICA and VA with distal changes suggesting prior pseudoaneurysm and dissection; L dist ICA 60%; normal MRI 10/29/11;  followed at [facility] with  serial CT scans   GERD (gastroesophageal reflux disease)    Headache    History of cardiac cath 2007   cath 11/25/05 by Dr. Jenne Campus with normal cors and EF 50%   Hypertension    a. 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 Prescott Parma  at [facility]   Patient Active Problem List   Diagnosis Date Noted   Osteoporosis 06/24/2022   Migraines 06/24/2022   COVID-19 virus infection 06/16/2021   Depression with anxiety 07/31/2020   NSVT (nonsustained ventricular tachycardia) (HCC) 01/12/2018   Chest pain 01/12/2018   ADHD 02/23/2017   Low back pain 06/18/2015   Pseudoaneurysm (HCC) 04/08/2012   Fibromuscular dysplasia (HCC) 11/04/2011   Renal artery stenosis due to fibromuscular dysplasia (HCC) 09/02/2011   Acute cervical arteriopathy associated with fibromuscular dysplasia (HCC) 07/17/2011   Aneurysm (HCC) 07/17/2011   Shortness of breath 03/20/2011   Fatigue 03/20/2011   Bruit 03/20/2011   Hyperlipidemia 03/20/2011   CHEST PAIN 05/20/2010   MORTON'S NEUROMA 12/27/2009   ARTHRITIS 12/27/2009   Palpitations 03/27/2009   Essential hypertension 04/17/2008   GERD 04/17/2008   INSOMNIA 04/17/2008   HEADACHE 04/17/2008   Home Medication(s) Prior to Admission medications   Medication Sig Start Date End Date Taking? Authorizing Provider  acetaminophen (TYLENOL) 500 MG tablet Take 1,000 mg by mouth  every 6 (six) hours as needed for moderate pain.   Yes [provider]  ALPRAZolam (XANAX) 1 MG tablet TAKE 1 TABLET(1 MG) BY MOUTH THREE TIMES DAILY Patient taking differently: Take 1 mg by mouth daily as needed for anxiety. 05/25/23  Yes Nelwyn Salisbury, MD  aspirin 81 MG tablet Take 81 mg by mouth daily.     Yes [provider]  cyclobenzaprine (FLEXERIL) 10 MG tablet TAKE 1 TABLET BY MOUTH THREE TIMES DAILY AS NEEDED FOR MUSCLE SPASM 10/20/22  Yes Nelwyn Salisbury, MD  escitalopram (LEXAPRO) 10 MG tablet TAKE 1 TABLET(10 MG) BY MOUTH DAILY 10/13/22  Yes Nelwyn Salisbury, MD  furosemide (LASIX) 20 MG tablet Take 20 mg by mouth daily.   Yes [provider]  HYDROcodone-acetaminophen (NORCO) 10-325 MG tablet Take 1 tablet by mouth every 6 (six) hours as needed. Patient taking differently: Take 1 tablet by mouth every 6 (six) hours as needed for moderate pain (pain score 4-6). 05/21/23  Yes Nelwyn Salisbury, MD  lisinopril (ZESTRIL) 20 MG tablet Take 1 tablet (20 mg total) by mouth daily. Patient taking differently: Take 20-40 mg by mouth daily. 10/15/21  Yes Nelwyn Salisbury, MD  metoprolol succinate (TOPROL-XL) 50 MG 24 hr tablet Take 1 tablet (50 mg total) by mouth in the morning and at bedtime. TAKE 50 MG IN AM AND 100 MG IN PM Patient taking differently: Take 50 mg by mouth daily. 11/06/20  Yes Nelwyn Salisbury, MD  promethazine (PHENERGAN) 25 MG tablet TAKE 1 TABLET(25 MG) BY MOUTH EVERY 4 HOURS AS NEEDED FOR NAUSEA OR VOMITING 06/30/23  Yes Nelwyn Salisbury, MD  Ubrogepant (UBRELVY) 100 MG TABS Take 1 tablet by mouth as needed (migraines). 09/25/22  Yes Nelwyn Salisbury, MD  zolpidem (AMBIEN) 10 MG tablet TAKE 1 TABLET(10 MG) BY MOUTH AT BEDTIME AS NEEDED FOR SLEEP 08/30/23  Yes Nelwyn Salisbury, MD  hydrochlorothiazide (HYDRODIURIL) 25 MG tablet Take 1 tablet (25 mg total) by mouth daily. Patient not taking: Reported on 09/14/2023 01/25/19   Nelwyn Salisbury, MD                                                                                                                                    Past Surgical History Past Surgical History:  Procedure Laterality Date   BREAST ENHANCEMENT SURGERY  1991   Bilateral breast    COLONOSCOPY  04/27/2014   per Dr. Loreta Ave, tubular adenomas, repeat in 5 yrs    LEFT HEART CATH AND CORONARY ANGIOGRAPHY N/A 01/12/2018   Procedure: LEFT HEART CATH AND CORONARY ANGIOGRAPHY;  Surgeon: [place], Peter M, MD;  Location: [facility] INVASIVE CV LAB;   Service: Cardiovascular;  Laterality: N/A;   Family History Family History  Problem Relation Age of Onset   Coronary artery disease Mother    Kidney disease Mother    Heart disease Mother 42  Heart attack Father 22       CABG   Kidney failure Father    Hypertension Father    Heart disease Father    Arthritis Other        family hx   Breast cancer Other        relative ,50   Diabetes Other        family hx   Hypertension Other        family hx   Kidney disease Other        family hx   Lung cancer Other        family hx    Social History Social History   Tobacco Use   Smoking status: Never   Smokeless tobacco: Never  Vaping Use   Vaping status: Never Used  Substance Use Topics   Alcohol use: No    Alcohol/week: 0.0 standard drinks of alcohol   Drug use: No   Allergies Amoxicillin, Sulfamethoxazole, and Sulfonamide derivatives  Review of Systems Review of Systems  All other systems reviewed and are negative.   Physical Exam Vital Signs  I have reviewed the triage vital signs BP (!) 147/83   Pulse 73   Temp 98 F (36.7 C) (Temporal)   Resp 18   Ht 5\' 2"  (1.575 m)   Wt 76.2 kg   LMP 06/14/2011   SpO2 100%   BMI 30.73 kg/m  Physical Exam Vitals and nursing note reviewed.  Constitutional:      General: She is not in acute distress.    Appearance: She is well-developed.  HENT:     Head: Normocephalic and atraumatic.     Mouth/Throat:     Mouth: Mucous membranes are moist.  Eyes:     Pupils: Pupils are equal, round, and reactive to light.  Cardiovascular:     Rate and Rhythm: Normal rate and regular rhythm.     Pulses:          Radial pulses are 2+ on the right side and 2+ on the left side.     Heart sounds: No murmur heard. Pulmonary:     Effort: Pulmonary effort is normal. No respiratory distress.     Breath sounds: Normal breath sounds.  Abdominal:     General: Abdomen is flat.     Palpations: Abdomen is soft.     Tenderness: There is no  abdominal tenderness.  Musculoskeletal:        General: No tenderness.     Right lower leg: No edema.     Left lower leg: No edema.  Skin:    General: Skin is warm and dry.  Neurological:     General: No focal deficit present.     Mental Status: She is alert. Mental status is at baseline.     Comments: Cranial nerves II through XII intact, strength 5 out of 5 in the bilateral upper and lower extremities, no sensory deficit to light touch, no dysmetria on finger-nose-finger testing, ambulatory with steady gait.  Psychiatric:        Mood and Affect: Mood normal.        Behavior: Behavior normal.     ED Results and Treatments Labs (all labs ordered are listed, but only abnormal results are displayed) Labs Reviewed  BASIC METABOLIC PANEL - Abnormal; Notable for the following components:      Result Value   Potassium 3.4 (*)    Glucose, Bld 114 (*)    BUN <5 (*)  All other components within normal limits  D-DIMER, QUANTITATIVE - Abnormal; Notable for the following components:   D-Dimer, Quant 0.74 (*)    All other components within normal limits  CBC  TROPONIN I (HIGH SENSITIVITY)  TROPONIN I (HIGH SENSITIVITY)                                                                                                                          Radiology CT Angio Chest PE W/Cm &/Or Wo Cm  Result Date: 09/14/2023 CLINICAL DATA:  Chest pain EXAM: CT ANGIOGRAPHY CHEST WITH CONTRAST TECHNIQUE: Multidetector CT imaging of the chest was performed using the standard protocol during bolus administration of intravenous contrast. Multiplanar CT image reconstructions and MIPs were obtained to evaluate the vascular anatomy. RADIATION DOSE REDUCTION: This exam was performed according to the departmental dose-optimization program which includes automated exposure control, adjustment of the mA and/or kV according to patient size and/or use of iterative reconstruction technique. CONTRAST:  61mL OMNIPAQUE  IOHEXOL 350 MG/ML SOLN COMPARISON:  Radiographs earlier today and CTA chest 09/22/2017 FINDINGS: Cardiovascular: Cardiomegaly. No pericardial effusion. Negative for acute pulmonary embolism. Mediastinum/Nodes: Trachea is unremarkable. Small hiatal hernia. No thoracic adenopathy. Lungs/Pleura: Lungs are clear. No pleural effusion or pneumothorax. Upper Abdomen: No acute abnormality. Musculoskeletal: Bilateral breast implants.  No acute fracture. Review of the MIP images confirms the above findings. IMPRESSION: 1. Negative for acute pulmonary embolism. No acute abnormality in the chest. Electronically Signed   By: Minerva Fester M.D.   On: 09/14/2023 20:37   CT ANGIO HEAD NECK W WO CM  Result Date: 09/14/2023 CLINICAL DATA:  Stroke, hemorrhagic. Hypertension. History of left carotid dissection. EXAM: CT ANGIOGRAPHY HEAD AND NECK WITH AND WITHOUT CONTRAST TECHNIQUE: Multidetector CT imaging of the head and neck was performed using the standard protocol during bolus administration of intravenous contrast. Multiplanar CT image reconstructions and MIPs were obtained to evaluate the vascular anatomy. Carotid stenosis measurements (when applicable) are obtained utilizing NASCET criteria, using the distal internal carotid diameter as the denominator. RADIATION DOSE REDUCTION: This exam was performed according to the departmental dose-optimization program which includes automated exposure control, adjustment of the mA and/or kV according to patient size and/or use of iterative reconstruction technique. CONTRAST:  75mL OMNIPAQUE IOHEXOL 350 MG/ML SOLN COMPARISON:  MRI head 05/20/2019.  CTA head and neck 06/19/2011. FINDINGS: CT HEAD FINDINGS Brain: There is no evidence of an acute infarct, intracranial hemorrhage, mass, midline shift, or extra-axial fluid collection. The ventricles and sulci are normal. Vascular: No hyperdense vessel. Skull: No acute fracture or suspicious osseous lesion. Sinuses/Orbits: Paranasal  sinuses and mastoid air cells are clear. Unremarkable orbits. Other: None. Review of the MIP images confirms the above findings CTA NECK FINDINGS Aortic arch: Standard 3 vessel aortic arch with widely patent arch vessel origins. Right carotid system: Patent without evidence of stenosis. Beading and fusiform dilatation of the mid and distal portions of the cervical ICA with the distal cervical ICA measuring up to  10 mm in diameter, slightly progressed from 2012. Left carotid system: Patent with prominent irregularity/beading of the mid and distal portions of the cervical ICA. Medially projecting pseudoaneurysm of the distal cervical ICA measuring 16 x 8 mm with severe stenosis of the ICA immediately distal to the pseudoaneurysm. These findings have not significantly changed from 2012. Vertebral arteries: Patent with the left being dominant. Beading of the left V1 and both V2 and V3 segments. 9 mm laterally projecting pseudoaneurysm of the left V3 segment, mildly smaller than in 2012. No significant stenosis. Skeleton: Cervical spondylosis and advanced multilevel facet arthrosis, asymmetrically worse on the left. Facet ankylosis on the right at C2-3 and bilaterally at C4-5. Other neck: No evidence of cervical lymphadenopathy or mass. Upper chest: Clear lung apices. Review of the MIP images confirms the above findings CTA HEAD FINDINGS Anterior circulation: The internal carotid arteries are patent from skull base to carotid termini without evidence of a significant stenosis. Mild beading in fusiform dilatation of the right petrous ICA is similar to the 2012 CTA. ACAs and MCAs are patent without evidence of a proximal branch occlusion or significant proximal stenosis. No aneurysm is identified. Posterior circulation: The intracranial vertebral arteries are patent to the basilar and mildly irregular without a significant stenosis. Patent PICA and SCA origins are visualized bilaterally. The basilar artery is widely  patent. There are right larger than left posterior communicating arteries with hypoplasia of the right P1 segment. Both PCAs are patent without evidence of a significant proximal stenosis. No aneurysm is identified. Venous sinuses: Patent. Anatomic variants: Predominantly fetal type origin of the right PCA. Review of the MIP images confirms the above findings IMPRESSION: 1. No evidence of acute intracranial abnormality or large vessel occlusion. 2. Findings compatible with fibromuscular dysplasia of the internal carotid and vertebral arteries. 3. Unchanged pseudoaneurysm of the distal left cervical ICA. 4. Mildly decreased size of a pseudoaneurysm of the left vertebral artery V3 segment. Electronically Signed   By: Sebastian Ache M.D.   On: 09/14/2023 19:54   DG Chest 2 View  Result Date: 09/14/2023 CLINICAL DATA:  chest pain EXAM: CHEST - 2 VIEW COMPARISON:  January 11, 2018 FINDINGS: The cardiomediastinal silhouette is unchanged in contour. No pleural effusion. No pneumothorax. No acute pleuroparenchymal abnormality. Visualized abdomen is unremarkable. Dextroscoliosis of the thoracolumbar junction. IMPRESSION: No acute cardiopulmonary abnormality. Electronically Signed   By: Meda Klinefelter M.D.   On: 09/14/2023 16:26    Pertinent labs & imaging results that were available during my care of the patient were reviewed by me and considered in my medical decision making (see MDM for details).  Medications Ordered in ED Medications  ketorolac (TORADOL) 15 MG/ML injection 15 mg (has no administration in time range)  acetaminophen (TYLENOL) tablet 1,000 mg (1,000 mg Oral Given 09/14/23 1721)  iohexol (OMNIPAQUE) 350 MG/ML injection 75 mL (75 mLs Intravenous Contrast Given 09/14/23 1836)  iohexol (OMNIPAQUE) 350 MG/ML injection 61 mL (61 mLs Intravenous Contrast Given 09/14/23 1928)  Procedures Procedures  (including critical care time)  Medical Decision Making / ED Course   MDM:  57 year old presenting to the emergency department with high blood pressure, also complaining of chest discomfort and shortness of breath.  Patient is well-appearing, physical examination is grossly unremarkable.  Her neurologic exam is normal.  Her blood pressure in the emergency department is slightly elevated.  Low concern for dangerous cause of her symptoms, will check labs including troponin, EKG to evaluate for ACS.  Low concern for pulmonary embolism but will check D-dimer.  Doubt acute intracranial process such as SAH, SDH, tumor, dissection but will check CTA head and neck given history of carotid dissection.  Her neurologic examination is very reassuring, doubt stroke.  Will check chest x-ray.  Doubt other process such as dissection, cardiomediastinal silhouette is stable, equal peripheral pulses, pain is very mild.  Will reassess.  If workup is reassuring, anticipate discharge with outpatient follow-up for further blood pressure management.   Clinical Course as of 09/14/23 2111  Tue Sep 14, 2023  2106 Workup is overall reassuring.  No acute vascular problem or brain bleed on CT head.  CT PE scan without evidence of any acute pulmonary embolism or other acute process.  Troponin negative x 2.  Advise follow-up with primary physician for her elevated blood pressure. Will discharge patient to home. All questions answered. Patient comfortable with plan of discharge. Return precautions discussed with patient and specified on the after visit summary.  [WS]    Clinical Course User Index [WS] Lonell Grandchild, MD     Additional history obtained: -Additional history obtained from spouse -External records from outside source obtained and reviewed including: Chart review including previous notes, labs, imaging, consultation notes including prior notes    Lab Tests: -I ordered,  reviewed, and interpreted labs.   The pertinent results include:   Labs Reviewed  BASIC METABOLIC PANEL - Abnormal; Notable for the following components:      Result Value   Potassium 3.4 (*)    Glucose, Bld 114 (*)    BUN <5 (*)    All other components within normal limits  D-DIMER, QUANTITATIVE - Abnormal; Notable for the following components:   D-Dimer, Quant 0.74 (*)    All other components within normal limits  CBC  TROPONIN I (HIGH SENSITIVITY)  TROPONIN I (HIGH SENSITIVITY)    Notable for elevated d-dimer  EKG   EKG Interpretation Date/Time:  Tuesday September 14 2023 14:42:19 EST Ventricular Rate:  99 PR Interval:  122 QRS Duration:  88 QT Interval:  370 QTC Calculation: 474 R Axis:   -13  Text Interpretation: Normal sinus rhythm Normal ECG Confirmed by Alvino Blood ([id number]) on 09/14/2023 4:28:06 PM         Imaging Studies ordered: I ordered imaging studies including CTA head and neck, CTA chest  On my interpretation imaging demonstrates no acute process I independently visualized and interpreted imaging. I agree with the radiologist interpretation   Medicines ordered and prescription drug management: Meds ordered this encounter  Medications   acetaminophen (TYLENOL) tablet 1,000 mg   iohexol (OMNIPAQUE) 350 MG/ML injection 75 mL   iohexol (OMNIPAQUE) 350 MG/ML injection 61 mL   ketorolac (TORADOL) 15 MG/ML injection 15 mg    -I have reviewed the patients home medicines and have made adjustments as needed   Cardiac Monitoring: The patient was maintained on a cardiac monitor.  I personally viewed and interpreted the cardiac monitored which showed an underlying rhythm of: NSR  Reevaluation: After the interventions noted above, I reevaluated the patient and found that their symptoms have improved  Co morbidities that complicate the patient evaluation  Past Medical History:  Diagnosis Date   Anxiety    Carotid artery injury    Carotid stenosis     Dopplers 6/12:0-39% bilateral; distal ICAs with marked tortuosity; CT angiogram recommended;  followed at [facility]; surgery to correct LICA stenosis and pseudoaneurysm too risky - > medical Rx;  dopplers 8/13: B/L 0-39% (stable)   Cerebral aneurysm    Chest pain    GXT echo 8/11: normal LVF, no ischemia;  h/o neg. myoview in 2005   Family history of adverse reaction to anesthesia    MOMTHER HAD TROUBLE WAKING   Fibromuscular dysplasia (HCC)    affects left vertebral,  artery, left ICA, left renal artery;  head and neck CTA 10/24/11: stable pattern of fibromuscular dysplasia of ICA and VA with distal changes suggesting prior pseudoaneurysm and dissection; L dist ICA 60%; normal MRI 10/29/11;  followed at [facility] with serial CT scans   GERD (gastroesophageal reflux disease)    Headache    History of cardiac cath 2007   cath 11/25/05 by Dr. Jenne Campus with normal cors and EF 50%   Hypertension    a. 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 Prescott Parma  at [facility]      Dispostion: Disposition decision including need for hospitalization was considered, and patient discharged from emergency department.    Final Clinical Impression(s) / ED Diagnoses Final diagnoses:  Hypertension, unspecified type     This chart was dictated using voice recognition software.  Despite best efforts to proofread,  errors can occur which can change the documentation meaning.    Lonell Grandchild, MD 09/14/23 2111

## 2023-09-14 NOTE — ED Provider Triage Note (Signed)
Emergency Medicine Provider Triage Evaluation Note  Caroline Boone , a 57 y.o. female  was evaluated in triage.  Pt complains of elevated BP readings of 160/110, normal is 130/80. States she is doing medication management/BP management for left carotid dissection. No new neck pain, changes in vision, or chest pain. Reports shortness of breath that began today  Review of Systems  Positive: As above Negative: As above  Physical Exam  BP (!) 191/91 (BP Location: Right Arm)   Pulse 85   Temp 98.2 F (36.8 C)   Resp 20   Ht 5\' 2"  (1.575 m)   Wt 76.2 kg   LMP 06/14/2011   SpO2 100%   BMI 30.73 kg/m  Gen:   Awake, no distress   Resp:  Normal effort  MSK:   Moves extremities without difficulty    Medical Decision Making  Medically screening exam initiated at 2:54 PM.  Appropriate orders placed.  Donalee Moreland was informed that the remainder of the evaluation will be completed by another provider, this initial triage assessment does not replace that evaluation, and the importance of remaining in the ED until their evaluation is complete.     Arabella Merles, PA-C 09/14/23 1454

## 2023-09-15 ENCOUNTER — Ambulatory Visit: Payer: BC Managed Care – PPO | Admitting: Family Medicine

## 2023-09-15 ENCOUNTER — Encounter: Payer: Self-pay | Admitting: Family Medicine

## 2023-09-15 VITALS — BP 140/92 | HR 88 | Temp 98.3°F | Wt 176.0 lb

## 2023-09-15 DIAGNOSIS — I10 Essential (primary) hypertension: Secondary | ICD-10-CM

## 2023-09-15 NOTE — Progress Notes (Signed)
   Subjective:    Patient ID: Nena Polio, female    DOB: [birth year], 57 y.o.   MRN: [id number]  HPI Here to follow up on an ED visit yesterday for elevated BP. Over the past week she had been getting readings up to 169/110 with heart rates up to 100. She has been having some mild headaches, but otherwise she has felt well. At the ED her labs were normal, including a creatinine of 0.53. EKG was normal. A CTA of the chest was normal. A CTA of the head and neck showed fibromuscular dysplasia of the internal carotids and vertebrals which had not changed from her baseline. She was sent home on her usual regimen of Lisinopril 20 mg BID and Metoprolol succinate 50 mg every morning. Today she feels fine.    Review of Systems  Constitutional: Negative.   Respiratory: Negative.    Cardiovascular: Negative.   Neurological:  Positive for headaches.       Objective:   Physical Exam Constitutional:      Appearance: Normal appearance.  Cardiovascular:     Rate and Rhythm: Normal rate and regular rhythm.     Pulses: Normal pulses.     Heart sounds: Normal heart sounds.  Pulmonary:     Effort: Pulmonary effort is normal.     Breath sounds: Normal breath sounds.  Musculoskeletal:     Right lower leg: No edema.     Left lower leg: No edema.  Neurological:     Mental Status: She is alert.           Assessment & Plan:  Her HTN has worsened a bit. We will increase the Metoprolol succinate to 100 mg (2 tablets) every morning, and she will report back to [place] in one week. We spent a total of ( 33  ) minutes reviewing records and discussing these issues.   Gershon Crane, MD

## 2023-09-21 DIAGNOSIS — I773 Arterial fibromuscular dysplasia: Secondary | ICD-10-CM | POA: Diagnosis not present

## 2023-09-21 DIAGNOSIS — E871 Hypo-osmolality and hyponatremia: Secondary | ICD-10-CM | POA: Diagnosis not present

## 2023-09-21 DIAGNOSIS — E785 Hyperlipidemia, unspecified: Secondary | ICD-10-CM | POA: Diagnosis not present

## 2023-09-21 DIAGNOSIS — I10 Essential (primary) hypertension: Secondary | ICD-10-CM | POA: Diagnosis not present

## 2023-10-05 DIAGNOSIS — I10 Essential (primary) hypertension: Secondary | ICD-10-CM | POA: Diagnosis not present

## 2023-10-25 ENCOUNTER — Other Ambulatory Visit: Payer: Self-pay | Admitting: Family Medicine

## 2023-10-25 DIAGNOSIS — F418 Other specified anxiety disorders: Secondary | ICD-10-CM

## 2023-10-25 DIAGNOSIS — I10 Essential (primary) hypertension: Secondary | ICD-10-CM | POA: Diagnosis not present

## 2023-11-01 ENCOUNTER — Other Ambulatory Visit: Payer: Self-pay | Admitting: Family Medicine

## 2023-11-08 ENCOUNTER — Telehealth (INDEPENDENT_AMBULATORY_CARE_PROVIDER_SITE_OTHER): Payer: BC Managed Care – PPO | Admitting: Family Medicine

## 2023-11-08 ENCOUNTER — Encounter: Payer: Self-pay | Admitting: Family Medicine

## 2023-11-08 DIAGNOSIS — M545 Low back pain, unspecified: Secondary | ICD-10-CM | POA: Diagnosis not present

## 2023-11-08 DIAGNOSIS — F1190 Opioid use, unspecified, uncomplicated: Secondary | ICD-10-CM

## 2023-11-08 DIAGNOSIS — G8929 Other chronic pain: Secondary | ICD-10-CM

## 2023-11-08 DIAGNOSIS — I10 Essential (primary) hypertension: Secondary | ICD-10-CM | POA: Diagnosis not present

## 2023-11-08 MED ORDER — HYDROCODONE-ACETAMINOPHEN 10-325 MG PO TABS: 1.0000 | ORAL_TABLET | Freq: Four times a day (QID) | ORAL | 0 refills | Status: DC | PRN

## 2023-11-08 NOTE — Progress Notes (Signed)
Subjective:    Patient ID: Caroline Boone, female    DOB: 28-May-1966, 58 y.o.   MRN: [id number]  HPI Virtual Visit via Video Note  I connected with the patient on 11/08/23 at  4:00 PM EST by a video enabled telemedicine application and verified that I am speaking with the correct person using two identifiers.  Location patient: home Location provider:work or home office Persons participating in the virtual visit: patient, provider  I discussed the limitations of evaluation and management by telemedicine and the availability of in person appointments. The patient expressed understanding and agreed to proceed.   HPI: Here for pain management. She is doing well.    ROS: See pertinent positives and negatives per HPI.  Past Medical History:  Diagnosis Date   Anxiety    Carotid artery injury    Carotid stenosis    Dopplers 6/12:0-39% bilateral; distal ICAs with marked tortuosity; CT angiogram recommended;  followed at [facility]; surgery to correct LICA stenosis and pseudoaneurysm too risky - > medical Rx;  dopplers 8/13: B/L 0-39% (stable)   Cerebral aneurysm    Chest pain    GXT echo 8/11: normal LVF, no ischemia;  h/o neg. myoview in 2005   Family history of adverse reaction to anesthesia    MOMTHER HAD TROUBLE WAKING   Fibromuscular dysplasia (HCC)    affects left vertebral,  artery, left ICA, left renal artery;  head and neck CTA 10/24/11: stable pattern of fibromuscular dysplasia of ICA and VA with distal changes suggesting prior pseudoaneurysm and dissection; L dist ICA 60%; normal MRI 10/29/11;  followed at [facility] with serial CT scans   GERD (gastroesophageal reflux disease)    Headache    History of cardiac cath 2007   cath 11/25/05 by Dr. Jenne Campus with normal cors and EF 50%   Hypertension    a. 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 Prescott Parma  at [facility]     Past Surgical History:  Procedure Laterality Date   BREAST ENHANCEMENT SURGERY  1991   Bilateral breast    COLONOSCOPY  04/27/2014   per Dr. Loreta Ave, tubular adenomas, repeat in 5 yrs    LEFT HEART CATH AND CORONARY ANGIOGRAPHY N/A 01/12/2018   Procedure: LEFT HEART CATH AND CORONARY ANGIOGRAPHY;  Surgeon: Swaziland, Peter M, MD;  Location: [facility] INVASIVE CV LAB;  Service: Cardiovascular;  Laterality: N/A;    Family History  Problem Relation Age of Onset   Coronary artery disease Mother    Kidney disease Mother    Heart disease Mother 48   Heart attack Father 69       CABG   Kidney failure Father    Hypertension Father    Heart disease Father    Arthritis Other        family hx   Breast cancer Other        relative ,50   Diabetes Other        family hx   Hypertension Other        family hx   Kidney disease Other        family hx   Lung cancer Other        family hx     Current Outpatient Medications:    acetaminophen (TYLENOL) 500 MG tablet, Take 1,000 mg by mouth every 6 (six)  hours as needed for moderate pain., Disp: , Rfl:    ALPRAZolam (XANAX) 1 MG tablet, TAKE 1 TABLET(1 MG) BY MOUTH THREE TIMES DAILY (Patient taking differently: Take 1 mg by mouth daily as needed for anxiety.), Disp: 270 tablet, Rfl: 1   aspirin 81 MG tablet, Take 81 mg by mouth daily.  , Disp: , Rfl:    cyclobenzaprine (FLEXERIL) 10 MG tablet, TAKE 1 TABLET BY MOUTH THREE TIMES DAILY AS NEEDED FOR MUSCLE SPASM, Disp: 90 tablet, Rfl: 5   escitalopram (LEXAPRO) 10 MG tablet, TAKE 1 TABLET(10 MG) BY MOUTH DAILY, Disp: 90 tablet, Rfl: 0   furosemide (LASIX) 20 MG tablet, Take 20 mg by mouth daily., Disp: , Rfl:    hydrochlorothiazide (HYDRODIURIL) 25 MG tablet, Take 1 tablet (25 mg total) by mouth daily., Disp: 30 tablet, Rfl: 4   lisinopril (ZESTRIL) 20 MG tablet, Take 1 tablet (20 mg total) by mouth daily. (Patient taking differently: Take 20-40 mg by mouth daily.), Disp: 90 tablet, Rfl: 3   metoprolol  succinate (TOPROL-XL) 50 MG 24 hr tablet, Take 1 tablet (50 mg total) by mouth in the morning and at bedtime. TAKE 50 MG IN AM AND 100 MG IN PM (Patient taking differently: Take 50 mg by mouth daily.), Disp: 60 tablet, Rfl: 0   promethazine (PHENERGAN) 25 MG tablet, TAKE 1 TABLET(25 MG) BY MOUTH EVERY 4 HOURS AS NEEDED FOR NAUSEA OR VOMITING, Disp: 60 tablet, Rfl: 5   UBRELVY 100 MG TABS, TAKE 1 TABLET BY MOUTH AS NEEDED FOR MIGRAINES, Disp: 30 tablet, Rfl: 11   zolpidem (AMBIEN) 10 MG tablet, TAKE 1 TABLET(10 MG) BY MOUTH AT BEDTIME AS NEEDED FOR SLEEP, Disp: 30 tablet, Rfl: 5   HYDROcodone-acetaminophen (NORCO) 10-325 MG tablet, Take 1 tablet by mouth every 6 (six) hours as needed (pain)., Disp: 120 tablet, Rfl: 0   HYDROcodone-acetaminophen (NORCO) 10-325 MG tablet, Take 1 tablet by mouth every 6 (six) hours as needed (pain)., Disp: 120 tablet, Rfl: 0   HYDROcodone-acetaminophen (NORCO) 10-325 MG tablet, Take 1 tablet by mouth every 6 (six) hours as needed (pain)., Disp: 120 tablet, Rfl: 0  EXAM:  VITALS per patient if applicable:  GENERAL: alert, oriented, appears well and in no acute distress  HEENT: atraumatic, conjunttiva clear, no obvious abnormalities on inspection of external nose and ears  NECK: normal movements of the head and neck  LUNGS: on inspection no signs of respiratory distress, breathing rate appears normal, no obvious gross SOB, gasping or wheezing  CV: no obvious cyanosis  MS: moves all visible extremities without noticeable abnormality  PSYCH/NEURO: pleasant and cooperative, no obvious depression or anxiety, speech and thought processing grossly intact  ASSESSMENT AND PLAN: Pain management. Indication for chronic opioid: low back pain Medication and dose: Norco 10-325 # pills per month: 120 Last UDS date: 05-21-23 Opioid Treatment Agreement signed (Y/N): 03-22-18 Opioid Treatment Agreement last reviewed with patient:  11-08-23 [facility] reviewed this encounter  (include red flags): Yes Meds were refilled.  Gershon Crane, MD  Discussed the following assessment and plan:  No diagnosis found.     I discussed the assessment and treatment plan with the patient. The patient was provided an opportunity to ask questions and all were answered. The patient agreed with the plan and demonstrated an understanding of the instructions.   The patient was advised to call back or seek an in-person evaluation if the symptoms worsen or if the condition fails to improve as anticipated.  Review of Systems     Objective:   Physical Exam        Assessment & Plan:

## 2023-11-25 ENCOUNTER — Other Ambulatory Visit: Payer: Self-pay | Admitting: Family Medicine

## 2024-01-09 ENCOUNTER — Other Ambulatory Visit: Payer: Self-pay | Admitting: Family Medicine

## 2024-01-10 NOTE — Telephone Encounter (Signed)
 Pt last VV was on 11/08/23 Last refill was done on 05/25/23 Please advise

## 2024-01-29 ENCOUNTER — Other Ambulatory Visit: Payer: Self-pay | Admitting: Family Medicine

## 2024-01-29 DIAGNOSIS — F418 Other specified anxiety disorders: Secondary | ICD-10-CM

## 2024-02-03 DIAGNOSIS — N390 Urinary tract infection, site not specified: Secondary | ICD-10-CM | POA: Diagnosis not present

## 2024-02-03 DIAGNOSIS — M549 Dorsalgia, unspecified: Secondary | ICD-10-CM | POA: Diagnosis not present

## 2024-02-21 ENCOUNTER — Other Ambulatory Visit: Payer: Self-pay | Admitting: Family Medicine

## 2024-04-04 ENCOUNTER — Encounter: Payer: Self-pay | Admitting: Family Medicine

## 2024-04-04 ENCOUNTER — Ambulatory Visit: Admitting: Family Medicine

## 2024-04-04 ENCOUNTER — Telehealth: Admitting: Family Medicine

## 2024-04-04 DIAGNOSIS — G8929 Other chronic pain: Secondary | ICD-10-CM | POA: Diagnosis not present

## 2024-04-04 DIAGNOSIS — M545 Low back pain, unspecified: Secondary | ICD-10-CM | POA: Diagnosis not present

## 2024-04-04 MED ORDER — HYDROCODONE-ACETAMINOPHEN 10-325 MG PO TABS: 1.0000 | ORAL_TABLET | Freq: Four times a day (QID) | ORAL | 0 refills | Status: DC | PRN

## 2024-04-04 NOTE — Progress Notes (Signed)
 Subjective:    Patient ID: Caroline Boone, female    DOB: 1966/01/14, 58 y.o.   MRN: [id number]  HPI Virtual Visit via Video Note  I connected with the patient on 04/04/24 at  3:45 PM EDT by a video enabled telemedicine application and verified that I am speaking with the correct person using two identifiers.  Location patient: home Location provider:work or home office Persons participating in the virtual visit: patient, provider  I discussed the limitations of evaluation and management by telemedicine and the availability of in person appointments. The patient expressed understanding and agreed to proceed.   HPI: Here for pain management. She is doing well.    ROS: See pertinent positives and negatives per HPI.  Past Medical History:  Diagnosis Date   Anxiety    Carotid artery injury    Carotid stenosis    Dopplers 6/12:0-39% bilateral; distal ICAs with marked tortuosity; CT angiogram recommended;  followed at [facility]; surgery to correct LICA stenosis and pseudoaneurysm too risky - > medical Rx;  dopplers 8/13: B/L 0-39% (stable)   Cerebral aneurysm    Chest pain    GXT echo 8/11: normal LVF, no ischemia;  h/o neg. myoview in 2005   Family history of adverse reaction to anesthesia    MOMTHER HAD TROUBLE WAKING   Fibromuscular dysplasia (HCC)    affects left vertebral,  artery, left ICA, left renal artery;  head and neck CTA 10/24/11: stable pattern of fibromuscular dysplasia of ICA and VA with distal changes suggesting prior pseudoaneurysm and dissection; L dist ICA 60%; normal MRI 10/29/11;  followed at [facility] with serial CT scans   GERD (gastroesophageal reflux disease)    Headache    History of cardiac cath 2007   cath 11/25/05 by Dr. Silvestre Drum with normal cors and EF 50%   Hypertension    a. 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 Frederik Jansky  at [facility]     Past Surgical History:  Procedure Laterality Date   BREAST ENHANCEMENT SURGERY  1991   Bilateral breast    COLONOSCOPY  04/27/2014   per Dr. Tova Fresh, tubular adenomas, repeat in 5 yrs    LEFT HEART CATH AND CORONARY ANGIOGRAPHY N/A 01/12/2018   Procedure: LEFT HEART CATH AND CORONARY ANGIOGRAPHY;  Surgeon: Swaziland, Peter M, MD;  Location: [facility] INVASIVE CV LAB;  Service: Cardiovascular;  Laterality: N/A;    Family History  Problem Relation Age of Onset   Coronary artery disease Mother    Kidney disease Mother    Heart disease Mother 34   Heart attack Father 100       CABG   Kidney failure Father    Hypertension Father    Heart disease Father    Arthritis Other        family hx   Breast cancer Other        relative ,50   Diabetes Other        family hx   Hypertension Other        family hx   Kidney disease Other        family hx   Lung cancer Other        family hx     Current Outpatient Medications:    acetaminophen  (TYLENOL ) 500 MG tablet, Take 1,000 mg by mouth every 6 (six)  hours as needed for moderate pain., Disp: , Rfl:    ALPRAZolam  (XANAX ) 1 MG tablet, TAKE 1 TABLET(1 MG) BY MOUTH THREE TIMES DAILY, Disp: 270 tablet, Rfl: 1   aspirin  81 MG tablet, Take 81 mg by mouth daily.  , Disp: , Rfl:    cyclobenzaprine  (FLEXERIL ) 10 MG tablet, TAKE 1 TABLET BY MOUTH THREE TIMES DAILY AS NEEDED FOR MUSCLE SPASM, Disp: 90 tablet, Rfl: 5   escitalopram  (LEXAPRO ) 10 MG tablet, TAKE 1 TABLET(10 MG) BY MOUTH DAILY, Disp: 90 tablet, Rfl: 0   furosemide (LASIX) 20 MG tablet, Take 20 mg by mouth daily., Disp: , Rfl:    hydrochlorothiazide  (HYDRODIURIL ) 25 MG tablet, Take 1 tablet (25 mg total) by mouth daily., Disp: 30 tablet, Rfl: 4   HYDROcodone -acetaminophen  (NORCO) 10-325 MG tablet, Take 1 tablet by mouth every 6 (six) hours as needed (pain)., Disp: 120 tablet, Rfl: 0   HYDROcodone -acetaminophen  (NORCO) 10-325 MG tablet, Take 1 tablet by mouth every 6 (six) hours as needed (pain).,  Disp: 120 tablet, Rfl: 0   HYDROcodone -acetaminophen  (NORCO) 10-325 MG tablet, Take 1 tablet by mouth every 6 (six) hours as needed (pain)., Disp: 120 tablet, Rfl: 0   lisinopril  (ZESTRIL ) 20 MG tablet, Take 1 tablet (20 mg total) by mouth daily. (Patient taking differently: Take 20-40 mg by mouth daily.), Disp: 90 tablet, Rfl: 3   metoprolol  succinate (TOPROL -XL) 50 MG 24 hr tablet, Take 1 tablet (50 mg total) by mouth in the morning and at bedtime. TAKE 50 MG IN AM AND 100 MG IN PM (Patient taking differently: Take 50 mg by mouth daily.), Disp: 60 tablet, Rfl: 0   promethazine  (PHENERGAN ) 25 MG tablet, TAKE 1 TABLET(25 MG) BY MOUTH EVERY 4 HOURS AS NEEDED FOR NAUSEA OR VOMITING, Disp: 60 tablet, Rfl: 5   UBRELVY  100 MG TABS, TAKE 1 TABLET BY MOUTH AS NEEDED FOR MIGRAINES, Disp: 30 tablet, Rfl: 11   zolpidem  (AMBIEN ) 10 MG tablet, TAKE 1 TABLET(10 MG) BY MOUTH AT BEDTIME AS NEEDED FOR SLEEP, Disp: 30 tablet, Rfl: 5  EXAM:  VITALS per patient if applicable:  GENERAL: alert, oriented, appears well and in no acute distress  HEENT: atraumatic, conjunttiva clear, no obvious abnormalities on inspection of external nose and ears  NECK: normal movements of the head and neck  LUNGS: on inspection no signs of respiratory distress, breathing rate appears normal, no obvious gross SOB, gasping or wheezing  CV: no obvious cyanosis  MS: moves all visible extremities without noticeable abnormality  PSYCH/NEURO: pleasant and cooperative, no obvious depression or anxiety, speech and thought processing grossly intact  ASSESSMENT AND PLAN: Pain management. Indication for chronic opioid: low back pain Medication and dose: Norco 10-325 # pills per month: 120 Last UDS date: 05-21-23 Opioid Treatment Agreement signed (Y/N): 03-22-18 Opioid Treatment Agreement last reviewed with patient:  04-04-24 [facility] reviewed this encounter (include red flags): Yes Meds were refilled.  Corita Diego, MD  Discussed the  following assessment and plan:  No diagnosis found.     I discussed the assessment and treatment plan with the patient. The patient was provided an opportunity to ask questions and all were answered. The patient agreed with the plan and demonstrated an understanding of the instructions.   The patient was advised to call back or seek an in-person evaluation if the symptoms worsen or if the condition fails to improve as anticipated.      Review of Systems     Objective:   Physical Exam  Assessment & Plan:

## 2024-04-19 ENCOUNTER — Encounter: Payer: Self-pay | Admitting: Family Medicine

## 2024-04-19 ENCOUNTER — Ambulatory Visit (INDEPENDENT_AMBULATORY_CARE_PROVIDER_SITE_OTHER): Admitting: Family Medicine

## 2024-04-19 VITALS — BP 136/92 | HR 61 | Temp 98.1°F | Ht 62.5 in | Wt 179.0 lb

## 2024-04-19 DIAGNOSIS — M2550 Pain in unspecified joint: Secondary | ICD-10-CM

## 2024-04-19 DIAGNOSIS — Z13220 Encounter for screening for lipoid disorders: Secondary | ICD-10-CM

## 2024-04-19 DIAGNOSIS — Z23 Encounter for immunization: Secondary | ICD-10-CM | POA: Diagnosis not present

## 2024-04-19 DIAGNOSIS — Z0000 Encounter for general adult medical examination without abnormal findings: Secondary | ICD-10-CM | POA: Diagnosis not present

## 2024-04-19 DIAGNOSIS — G8929 Other chronic pain: Secondary | ICD-10-CM

## 2024-04-19 DIAGNOSIS — Z131 Encounter for screening for diabetes mellitus: Secondary | ICD-10-CM

## 2024-04-19 LAB — LIPID PANEL
Cholesterol: 214 mg/dL — ABNORMAL HIGH (ref 0–200)
HDL: 61.9 mg/dL (ref >39.00)
LDL Cholesterol: 135 mg/dL — ABNORMAL HIGH (ref 0–99)
NonHDL: 152.4
Total CHOL/HDL Ratio: 3
Triglycerides: 89 mg/dL (ref 0.0–149.0)
VLDL: 17.8 mg/dL (ref 0.0–40.0)

## 2024-04-19 LAB — BASIC METABOLIC PANEL WITH GFR
BUN: 5 mg/dL — ABNORMAL LOW (ref 6–23)
CO2: 30 meq/L (ref 19–32)
Calcium: 9.1 mg/dL (ref 8.4–10.5)
Chloride: 103 meq/L (ref 96–112)
Creatinine, Ser: 0.61 mg/dL (ref 0.40–1.20)
GFR: 97.43 mL/min (ref >60.00)
Glucose, Bld: 85 mg/dL (ref 70–99)
Potassium: 3.8 meq/L (ref 3.5–5.1)
Sodium: 139 meq/L (ref 135–145)

## 2024-04-19 LAB — TSH: TSH: 1.41 u[IU]/mL (ref 0.35–5.50)

## 2024-04-19 LAB — CBC WITH DIFFERENTIAL/PLATELET
Basophils Absolute: 0.1 10*3/uL (ref 0.0–0.1)
Basophils Relative: 0.9 % (ref 0.0–3.0)
Eosinophils Absolute: 0.2 10*3/uL (ref 0.0–0.7)
Eosinophils Relative: 3.4 % (ref 0.0–5.0)
HCT: 40.5 % (ref 36.0–46.0)
Hemoglobin: 13.5 g/dL (ref 12.0–15.0)
Lymphocytes Relative: 34.5 % (ref 12.0–46.0)
Lymphs Abs: 1.9 10*3/uL (ref 0.7–4.0)
MCHC: 33.2 g/dL (ref 30.0–36.0)
MCV: 90.3 fl (ref 78.0–100.0)
Monocytes Absolute: 0.6 10*3/uL (ref 0.1–1.0)
Monocytes Relative: 10.6 % (ref 3.0–12.0)
Neutro Abs: 2.8 10*3/uL (ref 1.4–7.7)
Neutrophils Relative %: 50.6 % (ref 43.0–77.0)
Platelets: 286 10*3/uL (ref 150.0–400.0)
RBC: 4.49 Mil/uL (ref 3.87–5.11)
RDW: 13.4 % (ref 11.5–15.5)
WBC: 5.5 10*3/uL (ref 4.0–10.5)

## 2024-04-19 LAB — HEPATIC FUNCTION PANEL
ALT: 11 U/L (ref 0–35)
AST: 16 U/L (ref 0–37)
Albumin: 4.2 g/dL (ref 3.5–5.2)
Alkaline Phosphatase: 83 U/L (ref 39–117)
Bilirubin, Direct: 0.1 mg/dL (ref 0.0–0.3)
Total Bilirubin: 0.5 mg/dL (ref 0.2–1.2)
Total Protein: 7 g/dL (ref 6.0–8.3)

## 2024-04-19 LAB — HEMOGLOBIN A1C: Hgb A1c MFr Bld: 5.7 % (ref 4.6–6.5)

## 2024-04-19 LAB — SEDIMENTATION RATE: Sed Rate: 5 mm/h (ref 0–30)

## 2024-04-19 LAB — C-REACTIVE PROTEIN: CRP: <1 mg/dL (ref 0.5–20.0)

## 2024-04-19 NOTE — Progress Notes (Signed)
 Subjective:    Patient ID: Caroline Boone, female    DOB: 1966/10/10, 58 y.o.   MRN: [id number]  HPI Here for a well exam. She has been dealing with diffuse pains in her joints, and she asks for some arthritis testing. She takes Tylenol  as needed. She also is concerned about weight gain. Since going into menopause she has been slowly gaining weight despite watching her diet and walking for exercise. She sees Dr. Rayburn for her CKD, and he has been managing her HTN. She took herself off Lexapro  a month ago, and so far she is doing well.    Review of Systems  Constitutional: Negative.   HENT: Negative.    Eyes: Negative.   Respiratory: Negative.    Cardiovascular: Negative.   Gastrointestinal: Negative.   Genitourinary:  Negative for decreased urine volume, difficulty urinating, dyspareunia, dysuria, enuresis, flank pain, frequency, hematuria, pelvic pain and urgency.  Musculoskeletal:  Positive for arthralgias.  Skin: Negative.   Neurological: Negative.  Negative for headaches.  Psychiatric/Behavioral: Negative.         Objective:   Physical Exam Constitutional:      General: She is not in acute distress.    Appearance: Normal appearance. She is well-developed.  HENT:     Head: Normocephalic and atraumatic.     Right Ear: External ear normal.     Left Ear: External ear normal.     Nose: Nose normal.     Mouth/Throat:     Pharynx: No oropharyngeal exudate.  Eyes:     General: No scleral icterus.    Conjunctiva/sclera: Conjunctivae normal.     Pupils: Pupils are equal, round, and reactive to light.  Neck:     Thyroid : No thyromegaly.     Vascular: No JVD.  Cardiovascular:     Rate and Rhythm: Normal rate and regular rhythm.     Pulses: Normal pulses.     Heart sounds: Normal heart sounds. No murmur heard.    No friction rub. No gallop.  Pulmonary:     Effort: Pulmonary effort is normal. No respiratory distress.     Breath sounds: Normal breath sounds. No wheezing  or rales.  Chest:     Chest wall: No tenderness.  Abdominal:     General: Bowel sounds are normal. There is no distension.     Palpations: Abdomen is soft. There is no mass.     Tenderness: There is no abdominal tenderness. There is no guarding or rebound.  Musculoskeletal:        General: No tenderness. Normal range of motion.     Cervical back: Normal range of motion and neck supple.  Lymphadenopathy:     Cervical: No cervical adenopathy.  Skin:    General: Skin is warm and dry.     Findings: No erythema or rash.  Neurological:     General: No focal deficit present.     Mental Status: She is alert and oriented to person, place, and time.     Cranial Nerves: No cranial nerve deficit.     Motor: No abnormal muscle tone.     Coordination: Coordination normal.     Deep Tendon Reflexes: Reflexes are normal and symmetric. Reflexes normal.  Psychiatric:        Mood and Affect: Mood normal.        Behavior: Behavior normal.        Thought Content: Thought content normal.        Judgment: Judgment normal.  Assessment & Plan:  Well exam. We discussed diet and exercise. Get fasting labs. We will add a CRP, ESR, ANA, and RF for the joint pains. As far as her weight gain, I asked her to check with her insurance company as to whether they would cover any of the GLP-1 medications.  Garnette Olmsted, MD

## 2024-04-20 ENCOUNTER — Ambulatory Visit: Payer: Self-pay | Admitting: Family Medicine

## 2024-04-23 LAB — DRUG MONITORING, PANEL 8 WITH CONFIRMATION, URINE
6 Acetylmorphine: NEGATIVE ng/mL (ref <10)
Alcohol Metabolites: NEGATIVE ng/mL (ref <500)
Amphetamines: NEGATIVE ng/mL (ref <500)
Benzodiazepines: NEGATIVE ng/mL (ref <100)
Buprenorphine, Urine: NEGATIVE ng/mL (ref <5)
Cocaine Metabolite: NEGATIVE ng/mL (ref <150)
Codeine: NEGATIVE ng/mL (ref <50)
Creatinine: 149.9 mg/dL (ref >=20.0)
Hydrocodone: 1936 ng/mL — ABNORMAL HIGH (ref <50)
Hydromorphone: 833 ng/mL — ABNORMAL HIGH (ref <50)
MDMA: NEGATIVE ng/mL (ref <500)
Marijuana Metabolite: NEGATIVE ng/mL (ref <20)
Morphine: NEGATIVE ng/mL (ref <50)
Norhydrocodone: 4293 ng/mL — ABNORMAL HIGH (ref <50)
Opiates: POSITIVE ng/mL — AB (ref <100)
Oxidant: NEGATIVE ug/mL (ref <200)
Oxycodone: NEGATIVE ng/mL (ref <100)
pH: 6.5 (ref 4.5–9.0)

## 2024-04-23 LAB — ANTI-NUCLEAR AB-TITER (ANA TITER): ANA Titer 1: 1:40 {titer} — ABNORMAL HIGH

## 2024-04-23 LAB — DM TEMPLATE

## 2024-04-23 LAB — RHEUMATOID FACTOR: Rheumatoid fact SerPl-aCnc: <10 [IU]/mL (ref <14)

## 2024-04-23 LAB — ANA: Anti Nuclear Antibody (ANA): POSITIVE — AB

## 2024-05-12 DIAGNOSIS — I10 Essential (primary) hypertension: Secondary | ICD-10-CM | POA: Diagnosis not present

## 2024-05-12 DIAGNOSIS — E785 Hyperlipidemia, unspecified: Secondary | ICD-10-CM | POA: Diagnosis not present

## 2024-05-12 DIAGNOSIS — E871 Hypo-osmolality and hyponatremia: Secondary | ICD-10-CM | POA: Diagnosis not present

## 2024-05-12 DIAGNOSIS — I773 Arterial fibromuscular dysplasia: Secondary | ICD-10-CM | POA: Diagnosis not present

## 2024-05-24 ENCOUNTER — Ambulatory Visit: Admitting: Adult Health

## 2024-05-24 ENCOUNTER — Encounter: Payer: Self-pay | Admitting: Adult Health

## 2024-05-24 VITALS — BP 120/88 | HR 72 | Temp 97.9°F | Ht 62.5 in | Wt 177.0 lb

## 2024-05-24 DIAGNOSIS — K2100 Gastro-esophageal reflux disease with esophagitis, without bleeding: Secondary | ICD-10-CM | POA: Diagnosis not present

## 2024-05-24 DIAGNOSIS — R112 Nausea with vomiting, unspecified: Secondary | ICD-10-CM | POA: Diagnosis not present

## 2024-05-24 MED ORDER — ONDANSETRON 4 MG PO TBDP
4.0000 mg | ORAL_TABLET | Freq: Three times a day (TID) | ORAL | 1 refills | Status: AC | PRN
Start: 2024-05-24 — End: ?

## 2024-05-24 MED ORDER — PANTOPRAZOLE SODIUM 40 MG PO TBEC: 40.0000 mg | DELAYED_RELEASE_TABLET | Freq: Every day | ORAL | 0 refills | Status: DC

## 2024-05-24 MED ORDER — SUCRALFATE 1 G PO TABS
1.0000 g | ORAL_TABLET | Freq: Three times a day (TID) | ORAL | 0 refills | Status: DC
Start: 2024-05-24 — End: 2024-07-03

## 2024-05-24 NOTE — Progress Notes (Signed)
 Subjective:    Patient ID: Caroline Boone, female    DOB: 29-Mar-1966, 58 y.o.   MRN: [id number]  HPI 58 year old female who  has a past medical history of Anxiety, Carotid artery injury, Carotid stenosis, Cerebral aneurysm, Chest pain, Family history of adverse reaction to anesthesia, Fibromuscular dysplasia (HCC), GERD (gastroesophageal reflux disease), Headache, History of cardiac cath (2007), Hypertension, Insomnia, Palpitations, and Vertebral artery obstruction.  She is a patient of Dr. Johnny who I am seeing today for an acute issue.   She report that over the last month she has had pretty constant nausea and belching, at this time she restarted her omeprazole  40 mg.  Symptoms have slowly gotten worse until about 3 days ago when she started vomiting acid.  She has been unable to eat much because when she does eat it burns and then she will vomit again.  Symptoms seem to be worse when laying down at night.  Even water irritates her stomach.  She had 1 day of diarrhea when the vomiting started but her since stools have turned to normal.  Last time she vomited earlier this morning around 3 AM.  He denies abdominal pain.   Review of Systems See HPI   Past Medical History:  Diagnosis Date   Anxiety    Carotid artery injury    Carotid stenosis    Dopplers 6/12:0-39% bilateral; distal ICAs with marked tortuosity; CT angiogram recommended;  followed at [facility]; surgery to correct LICA stenosis and pseudoaneurysm too risky - > medical Rx;  dopplers 8/13: B/L 0-39% (stable)   Cerebral aneurysm    Chest pain    GXT echo 8/11: normal LVF, no ischemia;  h/o neg. myoview in 2005   Family history of adverse reaction to anesthesia    MOMTHER HAD TROUBLE WAKING   Fibromuscular dysplasia (HCC)    affects left vertebral,  artery, left ICA, left renal artery;  head and neck CTA 10/24/11: stable pattern of fibromuscular dysplasia of ICA and VA with distal changes suggesting prior pseudoaneurysm and  dissection; L dist ICA 60%; normal MRI 10/29/11;  followed at [facility] with serial CT scans   GERD (gastroesophageal reflux disease)    Headache    History of cardiac cath 2007   cath 11/25/05 by Dr. Herminio with normal cors and EF 50%   Hypertension    a. 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 Dino Sable  at [facility]    Social History   Socioeconomic History   Marital status: Married    Spouse name: Not on file   Number of children: Not on file   Years of education: Not on file   Highest education level: Associate degree: occupational, Scientist, product/process development, or vocational program  Occupational History   Not on file  Tobacco Use   Smoking status: Never   Smokeless tobacco: Never  Vaping Use   Vaping status: Never Used  Substance and Sexual Activity   Alcohol use: No    Alcohol/week: 0.0 standard drinks of alcohol   Drug use: No   Sexual activity: Yes    Birth control/protection: None  Other Topics Concern   Not on file  Social History Narrative   Not on file   Social Drivers of Health   Financial Resource Strain: Low Risk  (11/05/2022)   Overall Physicist, medical  Strain (CARDIA)    Difficulty of Paying Living Expenses: Not hard at all  Food Insecurity: No Food Insecurity (11/05/2022)   Hunger Vital Sign    Worried About Running Out of Food in the Last Year: Never true    Ran Out of Food in the Last Year: Never true  Transportation Needs: No Transportation Needs (11/05/2022)   PRAPARE - Administrator, Civil Service (Medical): No    Lack of Transportation (Non-Medical): No  Physical Activity: Insufficiently Active (11/05/2022)   Exercise Vital Sign    Days of Exercise per Week: 3 days    Minutes of Exercise per Session: 20 min  Stress: No Stress Concern Present (11/05/2022)   [employer] of Occupational Health - Occupational Stress Questionnaire     Feeling of Stress : Only a little  Social Connections: Unknown (11/05/2022)   Social Connection and Isolation Panel    Frequency of Communication with Friends and Family: More than three times a week    Frequency of Social Gatherings with Friends and Family: Once a week    Attends Religious Services: More than 4 times per year    Active Member of Clubs or Organizations: Yes    Attends Banker Meetings: 1 to 4 times per year    Marital Status: Patient declined  Intimate Partner Violence: Unknown (05/20/2022)   Received from [facility]   HITS    Physically Hurt: Not on file    Insult or Talk Down To: Not on file    Threaten Physical Harm: Not on file    Scream or Curse: Not on file    Past Surgical History:  Procedure Laterality Date   BREAST ENHANCEMENT SURGERY  1991   Bilateral breast    COLONOSCOPY  11/28/2021   per Dr. Kristie, precancerous polyps, repeat in 5 yrs   LEFT HEART CATH AND CORONARY ANGIOGRAPHY N/A 01/12/2018   Procedure: LEFT HEART CATH AND CORONARY ANGIOGRAPHY;  Surgeon: Swaziland, Peter M, MD;  Location: [facility] INVASIVE CV LAB;  Service: Cardiovascular;  Laterality: N/A;    Family History  Problem Relation Age of Onset   Coronary artery disease Mother    Kidney disease Mother    Heart disease Mother 69   Heart attack Father 33       CABG   Kidney failure Father    Hypertension Father    Heart disease Father    Arthritis Other        family hx   Breast cancer Other        relative ,50   Diabetes Other        family hx   Hypertension Other        family hx   Kidney disease Other        family hx   Lung cancer Other        family hx    Allergies  Allergen Reactions   Amoxicillin Nausea And Vomiting    Has patient had a PCN reaction causing immediate rash, facial/tongue/throat swelling, SOB or lightheadedness with hypotension: Yes Has patient had a PCN reaction causing severe rash involving mucus membranes or skin necrosis: No Has patient had  a PCN reaction that required hospitalization: No Has patient had a PCN reaction occurring within the last 10 years: No If all of the above answers are NO, then may proceed with Cephalosporin use.    Sulfamethoxazole Hives   Sulfonamide Derivatives Hives    Current Outpatient Medications on File  Prior to Visit  Medication Sig Dispense Refill   acetaminophen  (TYLENOL ) 500 MG tablet Take 1,000 mg by mouth every 6 (six) hours as needed for moderate pain.     ALPRAZolam  (XANAX ) 1 MG tablet TAKE 1 TABLET(1 MG) BY MOUTH THREE TIMES DAILY 270 tablet 1   aspirin  81 MG tablet Take 81 mg by mouth daily.       cyclobenzaprine  (FLEXERIL ) 10 MG tablet TAKE 1 TABLET BY MOUTH THREE TIMES DAILY AS NEEDED FOR MUSCLE SPASM 90 tablet 5   furosemide (LASIX) 20 MG tablet Take 20 mg by mouth daily.     hydrochlorothiazide  (HYDRODIURIL ) 25 MG tablet Take 1 tablet (25 mg total) by mouth daily. 30 tablet 4   HYDROcodone -acetaminophen  (NORCO) 10-325 MG tablet Take 1 tablet by mouth every 6 (six) hours as needed (pain). 120 tablet 0   HYDROcodone -acetaminophen  (NORCO) 10-325 MG tablet Take 1 tablet by mouth every 6 (six) hours as needed (pain). 120 tablet 0   HYDROcodone -acetaminophen  (NORCO) 10-325 MG tablet Take 1 tablet by mouth every 6 (six) hours as needed (pain). 120 tablet 0   lisinopril  (ZESTRIL ) 20 MG tablet Take 1 tablet (20 mg total) by mouth daily. (Patient taking differently: Take 20-40 mg by mouth daily.) 90 tablet 3   metoprolol  succinate (TOPROL -XL) 50 MG 24 hr tablet Take 1 tablet (50 mg total) by mouth in the morning and at bedtime. TAKE 50 MG IN AM AND 100 MG IN PM (Patient taking differently: Take 50 mg by mouth daily.) 60 tablet 0   promethazine  (PHENERGAN ) 25 MG tablet TAKE 1 TABLET(25 MG) BY MOUTH EVERY 4 HOURS AS NEEDED FOR NAUSEA OR VOMITING 60 tablet 5   UBRELVY  100 MG TABS TAKE 1 TABLET BY MOUTH AS NEEDED FOR MIGRAINES 30 tablet 11   zolpidem  (AMBIEN ) 10 MG tablet TAKE 1 TABLET(10 MG) BY  MOUTH AT BEDTIME AS NEEDED FOR SLEEP 30 tablet 5   No current facility-administered medications on file prior to visit.    BP 120/88   Pulse 72   Temp 97.9 F (36.6 C) (Oral)   Ht 5' 2.5 (1.588 m)   Wt 177 lb (80.3 kg)   LMP 06/14/2011   SpO2 97%   BMI 31.86 kg/m       Objective:   Physical Exam Vitals and nursing note reviewed.  Constitutional:      Appearance: Normal appearance.  Cardiovascular:     Rate and Rhythm: Normal rate and regular rhythm.     Pulses: Normal pulses.     Heart sounds: Normal heart sounds.  Pulmonary:     Breath sounds: Normal breath sounds.  Abdominal:     General: Abdomen is flat. Bowel sounds are normal. There is no distension.     Palpations: Abdomen is soft. There is no mass.     Tenderness: There is no abdominal tenderness.  Skin:    General: Skin is warm and dry.  Neurological:     General: No focal deficit present.     Mental Status: She is alert and oriented to person, place, and time.  Psychiatric:        Mood and Affect: Mood normal.        Behavior: Behavior normal.        Thought Content: Thought content normal.        Judgment: Judgment normal.        Assessment & Plan:  1. Gastroesophageal reflux disease with esophagitis without hemorrhage (Primary) - Will start on Protonix   and Carafate .  Will also refer her to gastroenterology for likely endoscopy.  Will have her follow-up with her PCP in 7 to 10 days. - pantoprazole  (PROTONIX ) 40 MG tablet; Take 1 tablet (40 mg total) by mouth daily.  Dispense: 90 tablet; Refill: 0 - sucralfate  (CARAFATE ) 1 g tablet; Take 1 tablet (1 g total) by mouth 4 (four) times daily -  with meals and at bedtime for 14 days.  Dispense: 56 tablet; Refill: 0 - Ambulatory referral to Gastroenterology  2. Nausea and vomiting, unspecified vomiting type  - ondansetron  (ZOFRAN -ODT) 4 MG disintegrating tablet; Take 1 tablet (4 mg total) by mouth every 8 (eight) hours as needed for nausea or vomiting.   Dispense: 20 tablet; Refill: 1  Krystena Reitter, NP  I personally spent a total of 31 minutes in the care of the patient today including preparing to see the patient, getting/reviewing separately obtained history, performing a medically appropriate exam/evaluation, counseling and educating, placing orders, documenting clinical information in the EHR, and communicating results.

## 2024-06-07 ENCOUNTER — Encounter: Payer: Self-pay | Admitting: Family Medicine

## 2024-06-08 MED ORDER — ESCITALOPRAM OXALATE 10 MG PO TABS
10.0000 mg | ORAL_TABLET | Freq: Every day | ORAL | 3 refills | Status: AC
Start: 2024-06-08 — End: ?

## 2024-06-08 NOTE — Telephone Encounter (Signed)
 Done

## 2024-07-03 ENCOUNTER — Encounter (HOSPITAL_COMMUNITY): Payer: Self-pay

## 2024-07-03 ENCOUNTER — Emergency Department (HOSPITAL_COMMUNITY)

## 2024-07-03 ENCOUNTER — Other Ambulatory Visit: Payer: Self-pay

## 2024-07-03 ENCOUNTER — Emergency Department (HOSPITAL_COMMUNITY)
Admission: EM | Admit: 2024-07-03 | Discharge: 2024-07-03 | Disposition: A | Discharge status: Home / Self Care | Attending: Emergency Medicine | Admitting: Emergency Medicine

## 2024-07-03 DIAGNOSIS — K2100 Gastro-esophageal reflux disease with esophagitis, without bleeding: Secondary | ICD-10-CM

## 2024-07-03 DIAGNOSIS — E871 Hypo-osmolality and hyponatremia: Secondary | ICD-10-CM | POA: Diagnosis not present

## 2024-07-03 DIAGNOSIS — R079 Chest pain, unspecified: Secondary | ICD-10-CM

## 2024-07-03 DIAGNOSIS — Z7982 Long term (current) use of aspirin: Secondary | ICD-10-CM | POA: Diagnosis not present

## 2024-07-03 DIAGNOSIS — R002 Palpitations: Secondary | ICD-10-CM | POA: Diagnosis not present

## 2024-07-03 DIAGNOSIS — Z79899 Other long term (current) drug therapy: Secondary | ICD-10-CM | POA: Diagnosis not present

## 2024-07-03 DIAGNOSIS — K219 Gastro-esophageal reflux disease without esophagitis: Secondary | ICD-10-CM | POA: Diagnosis not present

## 2024-07-03 DIAGNOSIS — R0789 Other chest pain: Secondary | ICD-10-CM | POA: Diagnosis not present

## 2024-07-03 DIAGNOSIS — I10 Essential (primary) hypertension: Secondary | ICD-10-CM | POA: Diagnosis not present

## 2024-07-03 LAB — COMPREHENSIVE METABOLIC PANEL WITH GFR
ALT: 12 U/L (ref 0–44)
AST: 18 U/L (ref 15–41)
Albumin: 4 g/dL (ref 3.5–5.0)
Alkaline Phosphatase: 83 U/L (ref 38–126)
Anion gap: 12 (ref 5–15)
BUN: <5 mg/dL — ABNORMAL LOW (ref 6–20)
CO2: 24 mmol/L (ref 22–32)
Calcium: 10.9 mg/dL — ABNORMAL HIGH (ref 8.9–10.3)
Chloride: 98 mmol/L (ref 98–111)
Creatinine, Ser: 0.73 mg/dL (ref 0.44–1.00)
GFR, Estimated: >60 mL/min (ref >60)
Glucose, Bld: 88 mg/dL (ref 70–99)
Potassium: 3.7 mmol/L (ref 3.5–5.1)
Sodium: 134 mmol/L — ABNORMAL LOW (ref 135–145)
Total Bilirubin: 0.8 mg/dL (ref 0.0–1.2)
Total Protein: 7.2 g/dL (ref 6.5–8.1)

## 2024-07-03 LAB — CBC
HCT: 44.7 % (ref 36.0–46.0)
Hemoglobin: 14.9 g/dL (ref 12.0–15.0)
MCH: 30.5 pg (ref 26.0–34.0)
MCHC: 33.3 g/dL (ref 30.0–36.0)
MCV: 91.6 fL (ref 80.0–100.0)
Platelets: 384 K/uL (ref 150–400)
RBC: 4.88 MIL/uL (ref 3.87–5.11)
RDW: 12.5 % (ref 11.5–15.5)
WBC: 10.3 K/uL (ref 4.0–10.5)
nRBC: 0 % (ref 0.0–0.2)

## 2024-07-03 LAB — TROPONIN I (HIGH SENSITIVITY)
Troponin I (High Sensitivity): 3 ng/L (ref <18)
Troponin I (High Sensitivity): 3 ng/L (ref <18)

## 2024-07-03 LAB — LIPASE, BLOOD: Lipase: 48 U/L (ref 11–51)

## 2024-07-03 MED ORDER — SUCRALFATE 1 GM/10ML PO SUSP: 1.0000 g | Freq: Three times a day (TID) | ORAL | 0 refills | Status: DC

## 2024-07-03 MED ORDER — PANTOPRAZOLE SODIUM 40 MG IV SOLR
40.0000 mg | Freq: Once | INTRAVENOUS | Status: AC
Start: 2024-07-03 — End: 2024-07-03
  Administered 2024-07-03: 40 mg via INTRAVENOUS
  Filled 2024-07-03: qty 10

## 2024-07-03 MED ORDER — ONDANSETRON 4 MG PO TBDP
4.0000 mg | ORAL_TABLET | Freq: Once | ORAL | Status: AC
  Administered 2024-07-03: 4 mg via ORAL
  Filled 2024-07-03: qty 1

## 2024-07-03 MED ORDER — PANTOPRAZOLE SODIUM 40 MG PO TBEC: 40.0000 mg | DELAYED_RELEASE_TABLET | Freq: Every day | ORAL | 0 refills | Status: DC

## 2024-07-03 MED ORDER — PROCHLORPERAZINE EDISYLATE 10 MG/2ML IJ SOLN
5.0000 mg | Freq: Once | INTRAMUSCULAR | Status: AC
Start: 2024-07-03 — End: 2024-07-03
  Administered 2024-07-03: 5 mg via INTRAVENOUS
  Filled 2024-07-03: qty 2

## 2024-07-03 MED ORDER — SODIUM CHLORIDE 0.9 % IV BOLUS
500.0000 mL | Freq: Once | INTRAVENOUS | Status: AC
  Administered 2024-07-03: 500 mL via INTRAVENOUS

## 2024-07-03 NOTE — ED Provider Notes (Signed)
 [place] EMERGENCY DEPARTMENT AT [facility] Provider Note   CSN: [id number] Arrival date & time: 07/03/24  1140     Patient presents with: Chest Pain   Caroline Boone is a 58 y.o. female.    Chest Pain Patient presents with chest pain.  Anterior chest.  Is had for few weeks now.  Has been diagnosed with GERD.  Due to see GI.  Has been on treatment for the GERD but continued symptoms.  States that vomiting.  Pain in the chest.  Particularly after eating.  Did have an episode after going to the bathroom where she felt her heart racing.  Appears as if she may have had a history of ventricular tachycardia and has seen electrophysiology in the past.  No exertional pain.  No diarrhea.  Pain goes from anterior chest to the back    Past Medical History:  Diagnosis Date   Anxiety    Carotid artery injury    Carotid stenosis    Dopplers 6/12:0-39% bilateral; distal ICAs with marked tortuosity; CT angiogram recommended;  followed at [facility]; surgery to correct LICA stenosis and pseudoaneurysm too risky - > medical Rx;  dopplers 8/13: B/L 0-39% (stable)   Cerebral aneurysm    Chest pain    GXT echo 8/11: normal LVF, no ischemia;  h/o neg. myoview in 2005   Family history of adverse reaction to anesthesia    MOMTHER HAD TROUBLE WAKING   Fibromuscular dysplasia (HCC)    affects left vertebral,  artery, left ICA, left renal artery;  head and neck CTA 10/24/11: stable pattern of fibromuscular dysplasia of ICA and VA with distal changes suggesting prior pseudoaneurysm and dissection; L dist ICA 60%; normal MRI 10/29/11;  followed at [facility] with serial CT scans   GERD (gastroesophageal reflux disease)    Headache    History of cardiac cath 2007   cath 11/25/05 by Dr. Herminio with normal cors and EF 50%   Hypertension    a. 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 Rashid  Janjua  at [facility]    Prior to Admission medications   Medication Sig Start Date End Date Taking? Authorizing Provider  sucralfate  (CARAFATE ) 1 GM/10ML suspension Take 10 mLs (1 g total) by mouth 4 (four) times daily -  with meals and at bedtime. 07/03/24  Yes Patsey Lot, MD  acetaminophen  (TYLENOL ) 500 MG tablet Take 1,000 mg by mouth every 6 (six) hours as needed for moderate pain.    [provider]  ALPRAZolam  (XANAX ) 1 MG tablet TAKE 1 TABLET(1 MG) BY MOUTH THREE TIMES DAILY 01/10/24   Johnny Garnette LABOR, MD  aspirin  81 MG tablet Take 81 mg by mouth daily.      [provider]  cyclobenzaprine  (FLEXERIL ) 10 MG tablet TAKE 1 TABLET BY MOUTH THREE TIMES DAILY AS NEEDED FOR MUSCLE SPASM 11/25/23   Johnny Garnette LABOR, MD  escitalopram  (LEXAPRO ) 10 MG tablet Take 1 tablet (10 mg total) by mouth daily. 06/08/24   Johnny Garnette LABOR, MD  furosemide (LASIX) 20 MG tablet Take 20 mg by mouth daily.    [provider]  hydrochlorothiazide  (HYDRODIURIL ) 25 MG tablet Take 1 tablet (25 mg total) by mouth daily. 01/25/19   Johnny Garnette LABOR, MD  HYDROcodone -acetaminophen  (NORCO) 10-325 MG tablet Take 1 tablet by mouth every 6 (  six) hours as needed (pain). 04/04/24   Johnny Garnette LABOR, MD  HYDROcodone -acetaminophen  (NORCO) 10-325 MG tablet Take 1 tablet by mouth every 6 (six) hours as needed (pain). 04/04/24   Johnny Garnette LABOR, MD  HYDROcodone -acetaminophen  (NORCO) 10-325 MG tablet Take 1 tablet by mouth every 6 (six) hours as needed (pain). 04/04/24   Johnny Garnette LABOR, MD  lisinopril  (ZESTRIL ) 20 MG tablet Take 1 tablet (20 mg total) by mouth daily. Patient taking differently: Take 20-40 mg by mouth daily. 10/15/21   Johnny Garnette LABOR, MD  metoprolol  succinate (TOPROL -XL) 50 MG 24 hr tablet Take 1 tablet (50 mg total) by mouth in the morning and at bedtime. TAKE 50 MG IN AM AND 100 MG IN PM Patient taking differently: Take 50 mg by mouth daily. 11/06/20   Johnny Garnette LABOR, MD  ondansetron  (ZOFRAN -ODT) 4 MG  disintegrating tablet Take 1 tablet (4 mg total) by mouth every 8 (eight) hours as needed for nausea or vomiting. 05/24/24   Nafziger, Darleene, NP  pantoprazole  (PROTONIX ) 40 MG tablet Take 1 tablet (40 mg total) by mouth daily. 07/03/24   Patsey Lot, MD  promethazine  (PHENERGAN ) 25 MG tablet TAKE 1 TABLET(25 MG) BY MOUTH EVERY 4 HOURS AS NEEDED FOR NAUSEA OR VOMITING 06/30/23   Johnny Garnette LABOR, MD  UBRELVY  100 MG TABS TAKE 1 TABLET BY MOUTH AS NEEDED FOR MIGRAINES 11/02/23   Johnny Garnette LABOR, MD  zolpidem  (AMBIEN ) 10 MG tablet TAKE 1 TABLET(10 MG) BY MOUTH AT BEDTIME AS NEEDED FOR SLEEP 02/22/24   Johnny Garnette LABOR, MD    Allergies: Amoxicillin, Sulfamethoxazole, and Sulfonamide derivatives    Review of Systems  Cardiovascular:  Positive for chest pain.    Updated Vital Signs BP 131/80 (BP Location: Right Arm)   Pulse 74   Temp 97.6 F (36.4 C) (Oral)   Resp 15   Ht 5' 2 (1.575 m)   Wt 68.5 kg   LMP 06/14/2011   SpO2 99%   BMI 27.64 kg/m   Physical Exam Vitals and nursing note reviewed.  Cardiovascular:     Rate and Rhythm: Normal rate and regular rhythm.  Pulmonary:     Breath sounds: No wheezing.  Chest:     Chest wall: Tenderness present.     Comments: Anterior chest tenderness without crepitance or deformity. Abdominal:     Tenderness: There is no abdominal tenderness.  Musculoskeletal:     Right lower leg: No edema.     Left lower leg: No edema.  Neurological:     Mental Status: She is alert.     (all labs ordered are listed, but only abnormal results are displayed) Labs Reviewed  COMPREHENSIVE METABOLIC PANEL WITH GFR - Abnormal; Notable for the following components:      Result Value   Sodium 134 (*)    BUN <5 (*)    Calcium 10.9 (*)    All other components within normal limits  CBC  LIPASE, BLOOD  TROPONIN I (HIGH SENSITIVITY)  TROPONIN I (HIGH SENSITIVITY)    EKG: EKG Interpretation Date/Time:  Monday July 03 2024 12:51:11 EDT Ventricular Rate:   111 PR Interval:  130 QRS Duration:  82 QT Interval:  356 QTC Calculation: 484 R Axis:   -37  Text Interpretation: Sinus tachycardia Left axis deviation  Compared with previous EKG from 09/14/2023 Confirmed by Gennaro Bouchard ([id number]) on 07/03/2024 12:54:31 PM  Radiology: [facility] Chest 2 View Result Date: 07/03/2024 CLINICAL DATA:  Chest pain. EXAM: CHEST - 2  VIEW COMPARISON:  09/14/2023 FINDINGS: Dextroscoliosis in the thoracolumbar spine. Bilateral breast implants. No focal lung disease. Heart and mediastinum are within normal limits. No large pleural effusions. IMPRESSION: 1. No active cardiopulmonary disease. 2. Scoliosis. Electronically Signed   By: Juliene Balder M.D.   On: 07/03/2024 13:30     Procedures   Medications Ordered in the ED  ondansetron  (ZOFRAN -ODT) disintegrating tablet 4 mg (4 mg Oral Given 07/03/24 1305)  sodium chloride  0.9 % bolus 500 mL (0 mLs Intravenous Stopped 07/03/24 2132)  pantoprazole  (PROTONIX ) injection 40 mg (40 mg Intravenous Given 07/03/24 1920)  prochlorperazine  (COMPAZINE ) injection 5 mg (5 mg Intravenous Given 07/03/24 1918)                                    Medical Decision Making Amount and/or Complexity of Data Reviewed Labs: ordered. Radiology: ordered.  Risk Prescription drug management.   Patient is likely GERD.  Chest pain.  States has trouble laying flat due to the reflux.  Due to see GI.  However also has had some vomiting.  EKG reassuring.  Blood work reassuring overall.  However pressure has been a little low.  Will give fluid bolus.  Will give some IV Protonix .  Zofran  has not helped so we will give some Compazine .  Doubt cardiac ischemia.  However has had some arrhythmia in the fast will need to follow with cardiology.  No free air seen.  Doubt esophageal perforation.  Feeling some better after treatment.  Discharge home.  Requested liquid Carafate  which she had been on recently.  Also given IV Protonix .  Does have GI to follow-up.   Has had palpitations.  Has had arrhythmia previously.  On monitoring here has been reassuring.  However will have follow-up with cardiology.  Will discharge.       Final diagnoses:  Palpitations  Chest pain, unspecified type  Gastroesophageal reflux disease, unspecified whether esophagitis present    ED Discharge Orders          Ordered    Ambulatory referral to Cardiology       Comments: If you have not heard from the Cardiology office within the next 72 hours please call [phone].   07/03/24 2048    pantoprazole  (PROTONIX ) 40 MG tablet  Daily        07/03/24 2050    sucralfate  (CARAFATE ) 1 GM/10ML suspension  3 times daily with meals & bedtime        07/03/24 2050               Patsey Lot, MD 07/03/24 2251

## 2024-07-03 NOTE — ED Triage Notes (Signed)
 Pt with a hx of uncontrolled GERD since last month presents with intermittent mid sternal CP that is described as stabbing and it if followed with N/V and spitting up acid without hematemesis. Does have intermittent loose and watery stool. Denies ShOB. She did have a near syncopal episode while at work last week and she was noted to have a HR of 125 at the time. She has a follow up with GI on 9/24.

## 2024-07-03 NOTE — ED Provider Triage Note (Signed)
 Emergency Medicine Provider Triage Evaluation Note  Caroline Boone , a 58 y.o. female  was evaluated in triage.  Pt complains of chest pain.  Patient with past history significant for poorly controlled GERD here with concerns of intermittent midsternal chest pain with radiation towards the upper back.  Does endorse some associated nausea and vomiting but denies any hematemesis or hematochezia.  No prior history of PE.  Denies any signs of shortness of breath at this time.  She does report having a near syncopal episode about 2 or 3 weeks ago while at work and reportedly had an elevated heart rate at that time.  She has previously followed with cardiology due to concerns of her tachycardia but she is unsure what the diagnosis for this is.  She does currently take 81 mg aspirin  daily but not on any other blood thinner..  Review of Systems  Positive: As above Negative: As above  Physical Exam  BP (!) 141/104   Pulse 100   Temp 98.8 F (37.1 C) (Oral)   Resp 17   Ht 5' 2 (1.575 m)   Wt 68.5 kg   LMP 06/14/2011   SpO2 98%   BMI 27.64 kg/m  Gen:   Awake, no distress Resp:  Normal effort  MSK:   Moves extremities without difficulty  Other:    Medical Decision Making  Medically screening exam initiated at 1:00 PM.  Appropriate orders placed.  Wilbert Schouten was informed that the remainder of the evaluation will be completed by another provider, this initial triage assessment does not replace that evaluation, and the importance of remaining in the ED until their evaluation is complete.     Micki Cassel A, PA-C 07/03/24 1300

## 2024-07-12 ENCOUNTER — Ambulatory Visit: Admitting: Gastroenterology

## 2024-07-12 ENCOUNTER — Other Ambulatory Visit (INDEPENDENT_AMBULATORY_CARE_PROVIDER_SITE_OTHER)

## 2024-07-12 ENCOUNTER — Encounter: Payer: Self-pay | Admitting: Gastroenterology

## 2024-07-12 VITALS — BP 118/68 | HR 86 | Ht 62.0 in | Wt 170.0 lb

## 2024-07-12 DIAGNOSIS — K219 Gastro-esophageal reflux disease without esophagitis: Secondary | ICD-10-CM | POA: Diagnosis not present

## 2024-07-12 DIAGNOSIS — K5900 Constipation, unspecified: Secondary | ICD-10-CM

## 2024-07-12 DIAGNOSIS — R195 Other fecal abnormalities: Secondary | ICD-10-CM | POA: Diagnosis not present

## 2024-07-12 DIAGNOSIS — R112 Nausea with vomiting, unspecified: Secondary | ICD-10-CM

## 2024-07-12 DIAGNOSIS — R142 Eructation: Secondary | ICD-10-CM

## 2024-07-12 DIAGNOSIS — R0789 Other chest pain: Secondary | ICD-10-CM

## 2024-07-12 LAB — CBC WITH DIFFERENTIAL/PLATELET
Basophils Absolute: 0.1 K/uL (ref 0.0–0.1)
Basophils Relative: 1.1 % (ref 0.0–3.0)
Eosinophils Absolute: 1 K/uL — ABNORMAL HIGH (ref 0.0–0.7)
Eosinophils Relative: 10.5 % — ABNORMAL HIGH (ref 0.0–5.0)
HCT: 39.5 % (ref 36.0–46.0)
Hemoglobin: 13.4 g/dL (ref 12.0–15.0)
Lymphocytes Relative: 28 % (ref 12.0–46.0)
Lymphs Abs: 2.7 K/uL (ref 0.7–4.0)
MCHC: 33.9 g/dL (ref 30.0–36.0)
MCV: 89.2 fl (ref 78.0–100.0)
Monocytes Absolute: 0.7 K/uL (ref 0.1–1.0)
Monocytes Relative: 7 % (ref 3.0–12.0)
Neutro Abs: 5.1 K/uL (ref 1.4–7.7)
Neutrophils Relative %: 53.4 % (ref 43.0–77.0)
Platelets: 304 K/uL (ref 150.0–400.0)
RBC: 4.43 Mil/uL (ref 3.87–5.11)
RDW: 13.4 % (ref 11.5–15.5)
WBC: 9.6 K/uL (ref 4.0–10.5)

## 2024-07-12 LAB — COMPREHENSIVE METABOLIC PANEL WITH GFR
ALT: 21 U/L (ref 0–35)
AST: 22 U/L (ref 0–37)
Albumin: 4.2 g/dL (ref 3.5–5.2)
Alkaline Phosphatase: 99 U/L (ref 39–117)
BUN: 6 mg/dL (ref 6–23)
CO2: 26 meq/L (ref 19–32)
Calcium: 9.1 mg/dL (ref 8.4–10.5)
Chloride: 101 meq/L (ref 96–112)
Creatinine, Ser: 0.56 mg/dL (ref 0.40–1.20)
GFR: 99.3 mL/min (ref >60.00)
Glucose, Bld: 81 mg/dL (ref 70–99)
Potassium: 3.6 meq/L (ref 3.5–5.1)
Sodium: 137 meq/L (ref 135–145)
Total Bilirubin: 0.4 mg/dL (ref 0.2–1.2)
Total Protein: 6.8 g/dL (ref 6.0–8.3)

## 2024-07-12 LAB — TSH: TSH: 0.51 u[IU]/mL (ref 0.35–5.50)

## 2024-07-12 MED ORDER — OMEPRAZOLE 40 MG PO CPDR: 40.0000 mg | DELAYED_RELEASE_CAPSULE | Freq: Two times a day (BID) | ORAL | 3 refills | Status: AC

## 2024-07-12 MED ORDER — SUCRALFATE 1 GM/10ML PO SUSP: 1.0000 g | Freq: Three times a day (TID) | ORAL | 3 refills | Status: DC

## 2024-07-12 NOTE — Patient Instructions (Signed)
 Your provider has requested that you go to the basement level for lab work before leaving today. Press B on the elevator. The lab is located at the first door on the left as you exit the elevator.  We have sent the following medications to your pharmacy for you to pick up at your convenience:  Omeprazole  - twice a day, Carafate   You have been scheduled for an endoscopy. Please follow written instructions given to you at your visit today.  If you use inhalers (even only as needed), please bring them with you on the day of your procedure.  If you take any of the following medications, they will need to be adjusted prior to your procedure:   DO NOT TAKE 7 DAYS PRIOR TO TEST- Trulicity (dulaglutide) Ozempic, Wegovy (semaglutide) Mounjaro (tirzepatide) Bydureon Bcise (exanatide extended release)  DO NOT TAKE 1 DAY PRIOR TO YOUR TEST Rybelsus (semaglutide) Adlyxin (lixisenatide) Victoza (liraglutide) Byetta (exanatide) ___________________________________________________________________________  _______________________________________________________  If your blood pressure at your visit was 140/90 or greater, please contact your primary care physician to follow up on this.  _______________________________________________________  If you are age 60 or older, your body mass index should be between 23-30. Your Body mass index is 31.09 kg/m. If this is out of the aforementioned range listed, please consider follow up with your Primary Care Provider.  If you are age 40 or younger, your body mass index should be between 19-25. Your Body mass index is 31.09 kg/m. If this is out of the aformentioned range listed, please consider follow up with your Primary Care Provider.   ________________________________________________________  The [facility] providers would like to encourage you to use MYCHART to communicate with providers for non-urgent requests or questions.  Due to long hold times on  the telephone, sending your provider a message by [facility] may be a faster and more efficient way to get a response.  Please allow 48 business hours for a response.  Please remember that this is for non-urgent requests.  _______________________________________________________  [facility] is using a team-based approach to care.  Your team is made up of your doctor and two to three APPS. Our APPS (Nurse Practitioners and Physician Assistants) work with your physician to ensure care continuity for you. They are fully qualified to address your health concerns and develop a treatment plan. They communicate directly with your gastroenterologist to care for you. Seeing the Advanced Practice Practitioners on your physician's team can help you by facilitating care more promptly, often allowing for earlier appointments, access to diagnostic testing, procedures, and other specialty referrals.

## 2024-07-12 NOTE — Progress Notes (Addendum)
 Caroline Boone [id number] 1966/03/31   Chief Complaint: GERD, nausea  Referring Provider: Johnny Garnette LABOR, MD Primary GI MD: Sampson  HPI: Caroline Boone is a 58 y.o. female with past medical history of anxiety, carotid stenosis, fibromuscular dysplasia, vertebral artery obstruction, cerebral aneurysm, GERD, HTN who presents today for a complaint of GERD.    Seen by PCP 05/24/2024 for complaint of nausea and belching, as well as vomiting acid.  Symptoms worse at night when laying down.  She was started on Protonix  and Carafate  and referred to GI for further evaluation and likely endoscopy.  Also given Zofran  for nausea.  Seen in the ED 07/03/2024 for complaint of chest pain.  Continued symptoms despite treatment for GERD.  Endorsed pain in chest particularly after eating.  Pain radiating to back.  Noted to have history of arrhythmia.  Had sinus tachycardia seen on EKG.  Blood work overall reassuring.  Was given IV Protonix  and Compazine .  Negative chest x-ray.  Negative troponin. Cardiac etiology thought unlikely but advised follow-up with cardiology due to history of arrhythmia.  Was feeling better after treatment, requested liquid Carafate .  Discharged with referral to cardiology.   Patient reports symptoms of chest pain after eating, nausea, intermittent vomiting, reflux, and heartburn.  Onset of symptoms around May.  Tried OTC Pepcid  with no relief.  Did have some improvement with her husbands omeprazole  40 mg daily.  States that Protonix  does not seem to help her.  Recently has noticed nausea seems to be becoming more frequent.  She has an upset stomach intermittently as well.  States there were 2 occasions while at work where she went to the bathroom and had face flushing, sweaty palms, then had diarrhea and increased heart rate.  States she works at a [employer]/product [employer] and a Radio broadcast assistant checked her heart rate which was 121.  Went to the ER with workup as above.  Liquid Carafate  does seem  to help with states that Protonix  has not been helpful.  Has discomfort in her chest with eating.  Denies any chest pain outside of eating or with exertion.  Has a burning sensation in her chest as well as acid reflux.  Can have unintentional regurgitation of undigested food.  States she had a burger last night and had horrible heartburn and reflux all night that interfered with her sleep.  Had been taking omeprazole  40 mg once daily but took an additional dose last night due to worsening symptoms.  States that she has had trouble with reflux in the past and would take OTC omeprazole , 14-day course which would help.  Reports seeing Dr. Kristie for the symptoms at age 20 and had an EGD and colonoscopy at that time.  She is unsure what was found on EGD.  She believes she has had a repeat colonoscopy and possibly an EGD as well since 2015 but cannot remember when that would have been, possibly around 2020.  Continues to take liquid Carafate  4 times daily and requests a refill today.  Over the last month reports she has been having alternating constipation, formed stools, and diarrhea.  Reports history of constipation and has been trying to eat more fiber.  Will go couple days without a bowel movement, then can have diarrhea for a day.  At this point she is having loose stools maybe twice a week, alternating with normal, formed bowel movements.  Norco is in her med list, she is only taking this as needed, not daily.  She has  seen black stools a few times over the last month but denies any rectal bleeding.  Denies use of Pepto-Bismol or iron recently.  Denies alcohol use.  Takes aspirin , was previously on Plavix but no longer taking this.  Has not followed up with cardiology regarding racing heart.  States she has seen a cardiologist before (Dr. Lynwood Schilling?) and was planning to give them a call.  She denies any shortness of breath.  Denies chest pain outside of eating and is not having any chest pain  with exertion.  Previous GI Procedures/Imaging   Colonoscopy 04/27/2014 (Dr. Kristie, [facility]) 1 diminutive polyp in the rectum, removed by cold biopsy Scattered sigmoid diverticula Small internal hemorrhoids Normal terminal ileum Path: Tubular adenoma  Past Medical History:  Diagnosis Date   Anxiety    Arthritis    Carotid artery injury    Carotid stenosis    Dopplers 6/12:0-39% bilateral; distal ICAs with marked tortuosity; CT angiogram recommended;  followed at [facility]; surgery to correct LICA stenosis and pseudoaneurysm too risky - > medical Rx;  dopplers 8/13: B/L 0-39% (stable)   Cerebral aneurysm    Chest pain    GXT echo 8/11: normal LVF, no ischemia;  h/o neg. myoview in 2005   Colon polyps    Family history of adverse reaction to anesthesia    MOMTHER HAD TROUBLE WAKING   Fibromuscular dysplasia    affects left vertebral,  artery, left ICA, left renal artery;  head and neck CTA 10/24/11: stable pattern of fibromuscular dysplasia of ICA and VA with distal changes suggesting prior pseudoaneurysm and dissection; L dist ICA 60%; normal MRI 10/29/11;  followed at [facility] with serial CT scans   GERD (gastroesophageal reflux disease)    Headache    History of cardiac cath 2007   cath 11/25/05 by Dr. Herminio with normal cors and EF 50%   Hypertension    a. 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 Dino Sable  at [facility]    Past Surgical History:  Procedure Laterality Date   BREAST ENHANCEMENT SURGERY  1991   Bilateral breast    COLONOSCOPY  11/28/2021   per Dr. Kristie, precancerous polyps, repeat in 5 yrs   ESOPHAGOGASTRODUODENOSCOPY     LEFT HEART CATH AND CORONARY ANGIOGRAPHY N/A 01/12/2018   Procedure: LEFT HEART CATH AND CORONARY ANGIOGRAPHY;  Surgeon: Swaziland, Peter M, MD;  Location: [facility] INVASIVE CV LAB;  Service: Cardiovascular;  Laterality: N/A;     Current Outpatient Medications  Medication Sig Dispense Refill   acetaminophen  (TYLENOL ) 500 MG tablet Take 1,000 mg by mouth every 6 (six) hours as needed for moderate pain.     ALPRAZolam  (XANAX ) 1 MG tablet TAKE 1 TABLET(1 MG) BY MOUTH THREE TIMES DAILY 270 tablet 1   aspirin  81 MG tablet Take 81 mg by mouth daily.       cyclobenzaprine  (FLEXERIL ) 10 MG tablet TAKE 1 TABLET BY MOUTH THREE TIMES DAILY AS NEEDED FOR MUSCLE SPASM 90 tablet 5   escitalopram  (LEXAPRO ) 10 MG tablet Take 1 tablet (10 mg total) by mouth daily. 90 tablet 3   furosemide (LASIX) 20 MG tablet Take 20 mg by mouth daily.     hydrochlorothiazide  (HYDRODIURIL ) 25 MG tablet Take 1 tablet (25 mg total) by mouth daily. 30 tablet 4   HYDROcodone -acetaminophen  (NORCO) 10-325 MG  tablet Take 1 tablet by mouth every 6 (six) hours as needed (pain). 120 tablet 0   HYDROcodone -acetaminophen  (NORCO) 10-325 MG tablet Take 1 tablet by mouth every 6 (six) hours as needed (pain). 120 tablet 0   HYDROcodone -acetaminophen  (NORCO) 10-325 MG tablet Take 1 tablet by mouth every 6 (six) hours as needed (pain). 120 tablet 0   lisinopril  (ZESTRIL ) 20 MG tablet Take 1 tablet (20 mg total) by mouth daily. (Patient taking differently: Take 20-40 mg by mouth daily.) 90 tablet 3   metoprolol  succinate (TOPROL -XL) 50 MG 24 hr tablet Take 1 tablet (50 mg total) by mouth in the morning and at bedtime. TAKE 50 MG IN AM AND 100 MG IN PM (Patient taking differently: Take 50 mg by mouth daily.) 60 tablet 0   omeprazole  (PRILOSEC) 40 MG capsule Take 40 mg by mouth daily.     ondansetron  (ZOFRAN -ODT) 4 MG disintegrating tablet Take 1 tablet (4 mg total) by mouth every 8 (eight) hours as needed for nausea or vomiting. 20 tablet 1   promethazine  (PHENERGAN ) 25 MG tablet TAKE 1 TABLET(25 MG) BY MOUTH EVERY 4 HOURS AS NEEDED FOR NAUSEA OR VOMITING 60 tablet 5   sucralfate  (CARAFATE ) 1 GM/10ML suspension Take 10 mLs (1 g total) by mouth 4 (four) times daily -  with  meals and at bedtime. 420 mL 0   UBRELVY  100 MG TABS TAKE 1 TABLET BY MOUTH AS NEEDED FOR MIGRAINES 30 tablet 11   zolpidem  (AMBIEN ) 10 MG tablet TAKE 1 TABLET(10 MG) BY MOUTH AT BEDTIME AS NEEDED FOR SLEEP 30 tablet 5   No current facility-administered medications for this visit.    Allergies as of 07/12/2024 - Review Complete 07/12/2024  Allergen Reaction Noted   Amoxicillin Nausea And Vomiting 04/17/2008   Sulfamethoxazole Hives 03/24/2019   Sulfonamide derivatives Hives 04/17/2008    Family History  Problem Relation Age of Onset   Coronary artery disease Mother    Kidney disease Mother    Heart disease Mother 2   Heart attack Father 22       CABG   Kidney failure Father    Hypertension Father    Heart disease Father    Arthritis Other        family hx   Breast cancer Other        relative ,50   Diabetes Other        family hx   Hypertension Other        family hx   Kidney disease Other        family hx   Lung cancer Other        family hx   Esophageal cancer Neg Hx    Colon cancer Neg Hx     Social History   Tobacco Use   Smoking status: Never   Smokeless tobacco: Never  Vaping Use   Vaping status: Never Used  Substance Use Topics   Alcohol use: No    Alcohol/week: 0.0 standard drinks of alcohol   Drug use: No     Review of Systems:    Constitutional: No unintentional weight loss, fever, chills Cardiovascular: Chest discomfort while eating, intermittent episodes of heart racing Respiratory: No SOB  Gastrointestinal: See HPI and otherwise negative    Physical Exam:  Vital signs: BP 118/68   Pulse 86   Ht 5' 2 (1.575 m)   Wt 170 lb (77.1 kg)   LMP 06/14/2011   BMI 31.09 kg/m    Constitutional: Pleasant,  obese female in NAD, alert and cooperative Head:  Normocephalic and atraumatic.  Eyes: No scleral icterus.  Respiratory: Respirations even and unlabored. Lungs clear to auscultation bilaterally.  No wheezes, crackles, or rhonchi.   Cardiovascular:  Regular rate and rhythm. No murmurs. No peripheral edema. Gastrointestinal:  Soft, nondistended, nontender. No rebound or guarding. Normal bowel sounds. No appreciable masses or hepatomegaly. Rectal:  Not performed.  Neurologic:  Alert and oriented x4;  grossly normal neurologically.  Skin:   Dry and intact without significant lesions or rashes. Psychiatric: Oriented to person, place and time. Demonstrates good judgement and reason without abnormal affect or behaviors.   RELEVANT LABS AND IMAGING: CBC    Component Value Date/Time   WBC 10.3 07/03/2024 1303   RBC 4.88 07/03/2024 1303   HGB 14.9 07/03/2024 1303   HCT 44.7 07/03/2024 1303   PLT 384 07/03/2024 1303   MCV 91.6 07/03/2024 1303   MCH 30.5 07/03/2024 1303   MCHC 33.3 07/03/2024 1303   RDW 12.5 07/03/2024 1303   LYMPHSABS 1.9 04/19/2024 0856   MONOABS 0.6 04/19/2024 0856   EOSABS 0.2 04/19/2024 0856   BASOSABS 0.1 04/19/2024 0856    CMP     Component Value Date/Time   NA 134 (L) 07/03/2024 1303   NA 134 05/10/2020 1003   K 3.7 07/03/2024 1303   CL 98 07/03/2024 1303   CO2 24 07/03/2024 1303   GLUCOSE 88 07/03/2024 1303   BUN <5 (L) 07/03/2024 1303   BUN 6 05/10/2020 1003   CREATININE 0.73 07/03/2024 1303   CREATININE 0.52 06/01/2017 1500   CALCIUM 10.9 (H) 07/03/2024 1303   PROT 7.2 07/03/2024 1303   ALBUMIN 4.0 07/03/2024 1303   AST 18 07/03/2024 1303   ALT 12 07/03/2024 1303   ALKPHOS 83 07/03/2024 1303   BILITOT 0.8 07/03/2024 1303   GFRNONAA >60 07/03/2024 1303   GFRNONAA >89 06/01/2017 1500   GFRAA 116 05/10/2020 1003   GFRAA >89 06/01/2017 1500   Echocardiogram 05/24/2020 1. Left ventricular ejection fraction, by estimation, is 55 to 60% . The left ventricle has normal function. The left ventricle has no regional wall motion abnormalities. Left ventricular diastolic parameters are consistent with Grade I diastolic dysfunction ( impaired relaxation) . The average left ventricular  global longitudinal strain is - 19. 5 % . The global longitudinal strain is normal.  2. Right ventricular systolic function is normal. The right ventricular size is normal. There is normal pulmonary artery systolic pressure.  3. The mitral valve is normal in structure. No evidence of mitral valve regurgitation. No evidence of mitral stenosis.  4. The aortic valve is tricuspid. Aortic valve regurgitation is not visualized. No aortic stenosis is present.  5. The inferior vena cava is normal in size with greater than 50% respiratory variability, suggesting right atrial pressure of 3 mmHg.  Assessment/Plan:   GERD Nausea and vomiting Chest pain Belching Dark stools Patient seen today for complaint of chest pain after eating, nausea and vomiting, reflux, heartburn despite use of omeprazole  40 mg daily.  Onset of symptoms in May and tried OTC Pepcid  with no relief.  Some improvement on omeprazole  and liquid Carafate  but continues to have symptoms, and recently has noticed increased frequency of nausea.  Unable to say how frequently she is vomiting.  Has had a few episodes of black stools over the last few months.  No anemia on recent labs. Reports having an EGD in 2015 with Dr. Kristie but cannot recall if there were  any abnormal findings. Seen in the ED 07/03/2024 for complaint of chest pain and endorsed palpitations at that time.  States she has had a few episodes of a racing heart in the last few months.  Workup in the ED showed sinus tachycardia on EKG, negative troponin, negative CXR, and cardiac etiology thought unlikely but advised follow-up with cardiology due to history of arrhythmia.  She has not yet seen cardiology and does not have an appointment scheduled.  Regular rate and rhythm on exam today.  She denies any shortness of breath and states she only has chest pain after eating.  - Will tentatively schedule EGD, and discuss further with team regarding whether patient will need to be evaluated by  cardiology prior to procedure based on reported palpitations.  I thoroughly discussed the procedure with the patient to include nature of the procedure, alternatives, benefits, and risks (including but not limited to bleeding, infection, perforation, anesthesia/cardiac/pulmonary complications). Patient verbalized understanding and gave verbal consent to proceed with procedure.  - Will send referral to cardiology - Increase omeprazole  to 40 mg twice daily - Continue liquid Carafate  4 times daily as needed, send refill - Labs today: CBC, CMP, TSH  Constipation Loose stools Patient reports history of chronic constipation which she has tried to manage with fiber.  Can go a couple days without a bowel movement, and every now and then will have intermittent loose stools.  Has been having more frequent loose stools recently, maybe twice a week, alternating with normal, formed bowel movements. No rectal bleeding.  - Recommend high-fiber diet and fiber supplementation.  Low suspicion for infection as she alternates between loose stools, formed stools, and constipation. - Consider colonoscopy if worsening symptoms - Request past GI records to determine when she is due for repeat colonoscopy for colon cancer screening purposes   Caroline Furbish, PA-C [facility] 07/12/2024, 2:22 PM  Patient Care Team: Johnny Garnette LABOR, MD as PCP - General (Family Medicine) Lavona Agent, MD as PCP - Cardiology (Cardiology) Inocencio Soyla Lunger, MD as PCP - Electrophysiology (Cardiology)     Addendum 07/18/2024:  Past GI records received.  Colonoscopy 04/27/2014 (Dr. Kristie, [facility]) 1 diminutive polyp in the rectum, removed by cold biopsy Scattered sigmoid diverticula Small internal hemorrhoids Normal terminal ileum Path: Tubular adenoma  EGD 12/04/2020 Normal appearing, widely patent esophagus and GEJ Erosive gastropathy with stigmata of recent bleeding in the antrum, biopsies  done Normal examined duodenum Path: Reactive gastropathy, no H. pylori, no intestinal metaplasia  Colonoscopy 12/04/2020 A few small scattered diverticula in the entire examined colon The examined portion of the ileum was normal No specimens collected Recall 5 years

## 2024-07-13 ENCOUNTER — Encounter: Payer: Self-pay | Admitting: Gastroenterology

## 2024-07-13 ENCOUNTER — Other Ambulatory Visit: Payer: Self-pay | Admitting: Family Medicine

## 2024-07-14 ENCOUNTER — Ambulatory Visit: Payer: Self-pay | Admitting: Gastroenterology

## 2024-07-18 ENCOUNTER — Telehealth: Payer: Self-pay | Admitting: Gastroenterology

## 2024-07-18 NOTE — Telephone Encounter (Signed)
 Past GI records received.  Patient's last colonoscopy done 12/04/2020 with no polyps found at that time but does have history of adenomatous colon polyps and a 5-year recall was recommended for surveillance. Please add patient to colonoscopy recall list for 12/04/2025.

## 2024-07-18 NOTE — Telephone Encounter (Signed)
 Recall placed

## 2024-07-25 ENCOUNTER — Telehealth: Payer: Self-pay

## 2024-07-25 NOTE — Telephone Encounter (Signed)
 Patient placed a call to our front desk staff.  Pt states that She is a former pt of [facility]. She wants to know if she just book an appointment. She said she is experiencing high heart rates, feeling flush and like she's about to pass out.   She states that these happen in spells.   Happened over the last month and a half.   Patient indicates that she saw Dr. Janjua in the past for a carotid aneurysm.  She indicates that she was told to see a vascular or cardiologist about her symptoms.   Based on her symptoms, I told her that my recommendation would be to see cardiology to rule out any cardiac involvement. I told her if she was concerned about her aneurysm that we can have her come back in to see Dr. Rosslyn. I told her that I think cardiac evaluation would be her best first step.   She knows that she can call us  back to discuss setting up an appointment if she or her cardiologist are concerned about these symptoms coming from her Carotid Aneurysm.

## 2024-07-26 ENCOUNTER — Ambulatory Visit: Payer: Self-pay

## 2024-07-26 ENCOUNTER — Telehealth: Payer: Self-pay

## 2024-07-26 NOTE — Telephone Encounter (Signed)
 Patient wants to know if Dr. Janjua has a recommendation for a cardiologist.   She wants to see a cardiologists due to her increased heart rate, near syncope episodes and flushing.   Her current cardiologist cannot see her until November but the patient is anxious to see if her symptoms are cardiac related.   We decided on asking Dr. Janjua for a recommendation. If she can be seen sooner by the provider then I will help Caroline Boone get in to see them. Alternatively I presented to her that she should touch base with her PCP until she can get in to see cards.   I will call Caroline Boone back after hearing from Dr. Rosslyn and we will discuss her next steps.

## 2024-07-26 NOTE — Telephone Encounter (Signed)
 FYI Pt has appointment tomorrow 07/27/24 for this problem

## 2024-07-26 NOTE — Telephone Encounter (Signed)
 FYI Only or Action Required?: FYI only for provider.  Patient was last seen in primary care on 05/24/2024 by Merna Huxley, NP.  Called Nurse Triage reporting Numbness.  Symptoms began several days ago.  Interventions attempted: Rest, hydration, or home remedies.  Symptoms are: unchanged.  Triage Disposition: See PCP When Office is Open (Within 3 Days)  Patient/caregiver understands and will follow disposition?: Yes  **Appt. Scheduled for 10/9**       Reason for Disposition  [1] Numbness or tingling on both sides of body AND [2] is a new symptom present > 24 hours  [1] Palpitations AND [2] no improvement after using Care Advice  Answer Assessment - Initial Assessment Questions 1. SYMPTOM: What is the main symptom you are concerned about? (e.g., weakness, numbness)     Red, tinging numbness BIL  2. ONSET: When did this start? (e.g., minutes, hours, days; while sleeping)     Intermittent, ongoing for a while, lasts hours   3. LAST NORMAL: When was the last time you (the patient) were normal (no symptoms)?     Yesterday during lunch  4. PATTERN Does this come and go, or has it been constant since it started?  Is it present now?     Intermittent   5. CARDIAC SYMPTOMS: Have you had any of the following symptoms: chest pain, difficulty breathing, palpitations?     Non e currently, but she reports palpitations when symptoms occur.   6. NEUROLOGIC SYMPTOMS: Have you had any of the following symptoms: headache, dizziness, vision loss, double vision, changes in speech, unsteady on your feet?     No   7. OTHER SYMPTOMS: Do you have any other symptoms?  Nausea  Patient is being seen by [facility]. She reports headaches, and multiple other symptoms. Not dizziness, SOB, chest pain noted. Would like to follow-up with PCP. Appt. Scheduled for 10/9  Protocols used: Neurologic Deficit-A-AH, Heart Rate and Heartbeat Questions-A-AH

## 2024-07-27 ENCOUNTER — Ambulatory Visit: Admitting: Family Medicine

## 2024-07-27 ENCOUNTER — Encounter: Payer: Self-pay | Admitting: Family Medicine

## 2024-07-27 VITALS — BP 136/80 | HR 82 | Temp 97.5°F | Wt 170.0 lb

## 2024-07-27 DIAGNOSIS — K219 Gastro-esophageal reflux disease without esophagitis: Secondary | ICD-10-CM | POA: Diagnosis not present

## 2024-07-27 DIAGNOSIS — R002 Palpitations: Secondary | ICD-10-CM

## 2024-07-27 DIAGNOSIS — R0789 Other chest pain: Secondary | ICD-10-CM

## 2024-07-27 MED ORDER — CARVEDILOL 12.5 MG PO TABS: 12.5000 mg | ORAL_TABLET | Freq: Two times a day (BID) | ORAL | 3 refills | Status: AC

## 2024-07-27 MED ORDER — METOCLOPRAMIDE HCL 10 MG PO TABS: 10.0000 mg | ORAL_TABLET | Freq: Three times a day (TID) | ORAL | 0 refills | Status: AC

## 2024-07-27 NOTE — Progress Notes (Signed)
   Subjective:    Patient ID: Caroline Boone, female    DOB: 07-07-1966, 58 y.o.   MRN: [id number]  HPI Here to follow up an ED visit on 07-03-24 for an episode of chest pain and vomiting. This started suddenly and only lasted about 10-15 minutes, but it was quite frightening to her. She had mild SOB only. At the ED her exam and labs were normal. EKG showed sinus tachycardia. CXR was unremarkable. It was felt that she has having severe GERD so she was started on Protonix  and Carafate . This do not seem to help much, and she continued to have these spells several times a week. On 07-12-24 she saw GI, and the Protonix  was changed to Prilosec BID along with the Carafate . She is scheduled for an EGD with Dr. Shila on 08-09-24. She is also scheduled to see Cardiology on 09-07-24. Today she feels fine.    Review of Systems  Constitutional: Negative.   Respiratory: Negative.    Cardiovascular:  Positive for chest pain. Negative for palpitations and leg swelling.  Gastrointestinal:  Positive for nausea and vomiting. Negative for abdominal distention, abdominal pain, blood in stool, constipation and diarrhea.  Neurological: Negative.        Objective:   Physical Exam Constitutional:      Appearance: Normal appearance. She is not ill-appearing.  Cardiovascular:     Rate and Rhythm: Normal rate and regular rhythm.     Pulses: Normal pulses.     Heart sounds: Normal heart sounds.  Pulmonary:     Effort: Pulmonary effort is normal.     Breath sounds: Normal breath sounds.  Abdominal:     General: Abdomen is flat. Bowel sounds are normal. There is no distension.     Palpations: Abdomen is soft. There is no mass.     Tenderness: There is no abdominal tenderness. There is no guarding or rebound.     Hernia: No hernia is present.  Neurological:     Mental Status: She is alert.           Assessment & Plan:  She is having spells of chest pain and vomiting which are likely esophageal in  origin. I think it is very likely she has a hiatal hernia. Once the chest pain and vomiting begins, her rapid heart rate is likely due to a stress reaction rather than a cardiac problem per se. We will add Metoclopramide  10 mg QID to her regimen, and we will switch from Metoprolol  to Carvedilol 12.5 mg BID. She will report back in 2 weeks. I personally spent a total of 35 minutes in the care of the patient today including getting/reviewing separately obtained history, performing a medically appropriate exam/evaluation, placing orders, documenting clinical information in the EHR, and independently interpreting results.  Garnette Olmsted, MD

## 2024-07-29 DIAGNOSIS — M19072 Primary osteoarthritis, left ankle and foot: Secondary | ICD-10-CM | POA: Diagnosis not present

## 2024-07-31 ENCOUNTER — Ambulatory Visit: Admitting: Family Medicine

## 2024-08-02 ENCOUNTER — Encounter: Payer: Self-pay | Admitting: Gastroenterology

## 2024-08-08 DIAGNOSIS — Z01411 Encounter for gynecological examination (general) (routine) with abnormal findings: Secondary | ICD-10-CM | POA: Diagnosis not present

## 2024-08-08 DIAGNOSIS — Z1331 Encounter for screening for depression: Secondary | ICD-10-CM | POA: Diagnosis not present

## 2024-08-08 DIAGNOSIS — M810 Age-related osteoporosis without current pathological fracture: Secondary | ICD-10-CM | POA: Diagnosis not present

## 2024-08-08 DIAGNOSIS — Z1231 Encounter for screening mammogram for malignant neoplasm of breast: Secondary | ICD-10-CM | POA: Diagnosis not present

## 2024-08-08 DIAGNOSIS — N952 Postmenopausal atrophic vaginitis: Secondary | ICD-10-CM | POA: Diagnosis not present

## 2024-08-08 LAB — HM MAMMOGRAPHY

## 2024-08-09 ENCOUNTER — Ambulatory Visit (AMBULATORY_SURGERY_CENTER): Admitting: Gastroenterology

## 2024-08-09 ENCOUNTER — Encounter: Payer: Self-pay | Admitting: Gastroenterology

## 2024-08-09 VITALS — BP 147/97 | HR 72 | Temp 98.1°F | Resp 17 | Ht 62.0 in | Wt 170.0 lb

## 2024-08-09 DIAGNOSIS — K219 Gastro-esophageal reflux disease without esophagitis: Secondary | ICD-10-CM

## 2024-08-09 DIAGNOSIS — K222 Esophageal obstruction: Secondary | ICD-10-CM

## 2024-08-09 DIAGNOSIS — K449 Diaphragmatic hernia without obstruction or gangrene: Secondary | ICD-10-CM

## 2024-08-09 DIAGNOSIS — K921 Melena: Secondary | ICD-10-CM

## 2024-08-09 DIAGNOSIS — R0789 Other chest pain: Secondary | ICD-10-CM

## 2024-08-09 DIAGNOSIS — R112 Nausea with vomiting, unspecified: Secondary | ICD-10-CM

## 2024-08-09 MED ORDER — SODIUM CHLORIDE 0.9 % IV SOLN: 500.0000 mL | INTRAVENOUS | Status: DC

## 2024-08-09 MED ORDER — SUCRALFATE 1 GM/10ML PO SUSP: 1.0000 g | Freq: Three times a day (TID) | ORAL | 3 refills | Status: AC

## 2024-08-09 NOTE — Progress Notes (Unsigned)
 [facility] History and Physical   Primary Care Physician:  Johnny Garnette LABOR, MD   Reason for Procedure:  GERD, noncardiac chest pain, nausea and vomiting, melena, excessive belching  Plan:    EGD  with possible interventions as needed     HPI: Caroline Boone is a very pleasant 59 y.o. female here for EGD for GERD, noncardiac chest pain, nausea and vomiting, melena, excessive belching.  Please refer to office visit note by Lauraine Furbish 07/18/24 for additional details  The risks and benefits as well as alternatives of endoscopic procedure(s) have been discussed and reviewed. All questions answered. The patient agrees to proceed.    Past Medical History:  Diagnosis Date   Anxiety    Arthritis    Carotid artery injury    Carotid stenosis    Dopplers 6/12:0-39% bilateral; distal ICAs with marked tortuosity; CT angiogram recommended;  followed at [facility]; surgery to correct LICA stenosis and pseudoaneurysm too risky - > medical Rx;  dopplers 8/13: B/L 0-39% (stable)   Cerebral aneurysm    Chest pain    GXT echo 8/11: normal LVF, no ischemia;  h/o neg. myoview in 2005   Colon polyps    Family history of adverse reaction to anesthesia    MOMTHER HAD TROUBLE WAKING   Fibromuscular dysplasia    affects left vertebral,  artery, left ICA, left renal artery;  head and neck CTA 10/24/11: stable pattern of fibromuscular dysplasia of ICA and VA with distal changes suggesting prior pseudoaneurysm and dissection; L dist ICA 60%; normal MRI 10/29/11;  followed at [facility] with serial CT scans   GERD (gastroesophageal reflux disease)    Headache    History of cardiac cath 2007   cath 11/25/05 by Dr. Herminio with normal cors and EF 50%   Hypertension    a. 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 Dino Sable  at [facility]    Past Surgical History:  Procedure Laterality Date    BREAST ENHANCEMENT SURGERY  1991   Bilateral breast    COLONOSCOPY  11/28/2021   per Dr. Kristie, precancerous polyps, repeat in 5 yrs   ESOPHAGOGASTRODUODENOSCOPY     LEFT HEART CATH AND CORONARY ANGIOGRAPHY N/A 01/12/2018   Procedure: LEFT HEART CATH AND CORONARY ANGIOGRAPHY;  Surgeon: Swazil[place], Peter M, MD;  Location: [facility] INVASIVE CV LAB;  Service: Cardiovascular;  Laterality: N/A;    Prior to Admission medications   Medication Sig Start Date End Date Taking? Authorizing Provider  acetaminophen  (TYLENOL ) 500 MG tablet Take 1,000 mg by mouth every 6 (six) hours as needed for moderate pain.   Yes [provider]  ALPRAZolam  (XANAX ) 1 MG tablet TAKE 1 TABLET(1 MG) BY MOUTH THREE TIMES DAILY 01/10/24  Yes Johnny Garnette LABOR, MD  aspirin  81 MG tablet Take 81 mg by mouth daily.     Yes [provider]  carvedilol (COREG) 12.5 MG tablet Take 1 tablet (12.5 mg total) by mouth 2 (two) times daily with a meal. 07/27/24  Yes Johnny Garnette LABOR, MD  celecoxib (CELEBREX) 200 MG capsule Take by mouth.   Yes [provider]  escitalopram  (LEXAPRO ) 10 MG tablet Take 1 tablet (10 mg total) by mouth daily. 06/08/24  Yes Johnny Garnette LABOR, MD  estradiol (ESTRACE) 0.01 % CREA vaginal cream Place vaginally. 08/08/24  Yes [provider]  furosemide (LASIX) 20 MG tablet Take 20 mg by mouth daily.   Yes [provider]  lisinopril  (ZESTRIL ) 20 MG tablet Take 1 tablet (20 mg total) by mouth daily. Patient taking differently: Take 20-40 mg by mouth daily. 10/15/21  Yes Johnny Garnette LABOR, MD  metoCLOPramide  (REGLAN ) 10 MG tablet Take 1 tablet (10 mg total) by mouth 4 (four) times daily -  before meals and at bedtime. 07/27/24  Yes Johnny Garnette LABOR, MD  omeprazole  (PRILOSEC) 40 MG capsule Take 1 capsule (40 mg total) by mouth in the morning and at bedtime. 07/12/24  Yes Arletta Credit E, PA-C  zolpidem  (AMBIEN ) 10 MG tablet TAKE 1 TABLET(10 MG) BY MOUTH AT BEDTIME AS NEEDED FOR SLEEP 02/22/24  Yes  Johnny Garnette LABOR, MD  cyclobenzaprine  (FLEXERIL ) 10 MG tablet TAKE 1 TABLET BY MOUTH THREE TIMES DAILY AS NEEDED FOR MUSCLE SPASM 11/25/23   Johnny Garnette LABOR, MD  hydrochlorothiazide  (HYDRODIURIL ) 25 MG tablet Take 1 tablet (25 mg total) by mouth daily. Patient not taking: Reported on 08/09/2024 01/25/19   Johnny Garnette LABOR, MD  HYDROcodone -acetaminophen  (NORCO) 10-325 MG tablet Take 1 tablet by mouth every 6 (six) hours as needed (pain). 04/04/24   Johnny Garnette LABOR, MD  HYDROcodone -acetaminophen  (NORCO) 10-325 MG tablet Take 1 tablet by mouth every 6 (six) hours as needed (pain). 04/04/24   Johnny Garnette LABOR, MD  HYDROcodone -acetaminophen  (NORCO) 10-325 MG tablet Take 1 tablet by mouth every 6 (six) hours as needed (pain). 04/04/24   Johnny Garnette LABOR, MD  ondansetron  (ZOFRAN -ODT) 4 MG disintegrating tablet Take 1 tablet (4 mg total) by mouth every 8 (eight) hours as needed for nausea or vomiting. 05/24/24   Nafziger, Darleene, NP  promethazine  (PHENERGAN ) 25 MG tablet TAKE 1 TABLET(25 MG) BY MOUTH EVERY 4 HOURS AS NEEDED FOR NAUSEA OR VOMITING 07/14/24   Johnny Garnette LABOR, MD  sucralfate  (CARAFATE ) 1 GM/10ML suspension Take 10 mLs (1 g total) by mouth 4 (four) times daily -  with meals and at bedtime. 07/12/24   Heinz, Credit BRAVO, PA-C  UBRELVY  100 MG TABS TAKE 1 TABLET BY MOUTH AS NEEDED FOR MIGRAINES 11/02/23   Johnny Garnette LABOR, MD    Current Outpatient Medications  Medication Sig Dispense Refill   acetaminophen  (TYLENOL ) 500 MG tablet Take 1,000 mg by mouth every 6 (six) hours as needed for moderate pain.     ALPRAZolam  (XANAX ) 1 MG tablet TAKE 1 TABLET(1 MG) BY MOUTH THREE TIMES DAILY 270 tablet 1   aspirin  81 MG tablet Take 81 mg by mouth daily.       carvedilol (COREG) 12.5 MG tablet Take 1 tablet (12.5 mg total) by mouth 2 (two) times daily with a meal. 60 tablet 3   celecoxib (CELEBREX) 200 MG capsule Take by mouth.     escitalopram  (LEXAPRO ) 10 MG tablet Take 1 tablet (10 mg total) by mouth daily. 90 tablet 3   estradiol  (ESTRACE) 0.01 % CREA vaginal cream Place vaginally.     furosemide (LASIX) 20 MG tablet Take 20 mg by mouth daily.     lisinopril  (ZESTRIL ) 20 MG tablet Take 1 tablet (20 mg total) by mouth daily. (Patient taking differently: Take 20-40 mg by mouth daily.) 90 tablet 3   metoCLOPramide  (REGLAN ) 10 MG tablet Take 1 tablet (10 mg total) by mouth 4 (four) times daily -  before meals and at bedtime. 120 tablet 0   omeprazole  (PRILOSEC) 40 MG capsule Take 1 capsule (40 mg total)  by mouth in the morning and at bedtime. 60 capsule 3   zolpidem  (AMBIEN ) 10 MG tablet TAKE 1 TABLET(10 MG) BY MOUTH AT BEDTIME AS NEEDED FOR SLEEP 30 tablet 5   cyclobenzaprine  (FLEXERIL ) 10 MG tablet TAKE 1 TABLET BY MOUTH THREE TIMES DAILY AS NEEDED FOR MUSCLE SPASM 90 tablet 5   hydrochlorothiazide  (HYDRODIURIL ) 25 MG tablet Take 1 tablet (25 mg total) by mouth daily. (Patient not taking: Reported on 08/09/2024) 30 tablet 4   HYDROcodone -acetaminophen  (NORCO) 10-325 MG tablet Take 1 tablet by mouth every 6 (six) hours as needed (pain). 120 tablet 0   HYDROcodone -acetaminophen  (NORCO) 10-325 MG tablet Take 1 tablet by mouth every 6 (six) hours as needed (pain). 120 tablet 0   HYDROcodone -acetaminophen  (NORCO) 10-325 MG tablet Take 1 tablet by mouth every 6 (six) hours as needed (pain). 120 tablet 0   ondansetron  (ZOFRAN -ODT) 4 MG disintegrating tablet Take 1 tablet (4 mg total) by mouth every 8 (eight) hours as needed for nausea or vomiting. 20 tablet 1   promethazine  (PHENERGAN ) 25 MG tablet TAKE 1 TABLET(25 MG) BY MOUTH EVERY 4 HOURS AS NEEDED FOR NAUSEA OR VOMITING 60 tablet 5   sucralfate  (CARAFATE ) 1 GM/10ML suspension Take 10 mLs (1 g total) by mouth 4 (four) times daily -  with meals and at bedtime. 420 mL 3   UBRELVY  100 MG TABS TAKE 1 TABLET BY MOUTH AS NEEDED FOR MIGRAINES 30 tablet 11   Current Facility-Administered Medications  Medication Dose Route Frequency Provider Last Rate Last Admin   0.9 %  sodium chloride   infusion  500 mL Intravenous Continuous Janijah Symons V, MD        Allergies as of 08/09/2024 - Review Complete 08/09/2024  Allergen Reaction Noted   Amoxicillin Nausea And Vomiting 04/17/2008   Sulfamethoxazole Hives 03/24/2019   Sulfonamide derivatives Hives 04/17/2008    Family History  Problem Relation Age of Onset   Coronary artery disease Mother    Kidney disease Mother    Heart disease Mother 7   Heart attack Father 72       CABG   Kidney failure Father    Hypertension Father    Heart disease Father    Arthritis Other        family hx   Breast cancer Other        relative ,50   Diabetes Other        family hx   Hypertension Other        family hx   Kidney disease Other        family hx   Lung cancer Other        family hx   Esophageal cancer Neg Hx    Colon cancer Neg Hx    Rectal cancer Neg Hx    Stomach cancer Neg Hx     Social History   Socioeconomic History   Marital status: Married    Spouse name: Not on file   Number of children: 2   Years of education: Not on file   Highest education level: Associate degree: occupational, Scientist, product/process development, or vocational program  Occupational History   Not on file  Tobacco Use   Smoking status: Never   Smokeless tobacco: Never  Vaping Use   Vaping status: Never Used  Substance and Sexual Activity   Alcohol use: No    Alcohol/week: 0.0 standard drinks of alcohol   Drug use: No   Sexual activity: Yes    Partners: Male  Birth control/protection: None  Other Topics Concern   Not on file  Social History Narrative   Not on file   Social Drivers of Health   Financial Resource Strain: Low Risk  (11/05/2022)   Overall Financial Resource Strain (CARDIA)    Difficulty of Paying Living Expenses: Not hard at all  Food Insecurity: No Food Insecurity (11/05/2022)   Hunger Vital Sign    Worried About Running Out of Food in the Last Year: Never true    Ran Out of Food in the Last Year: Never true  Transportation  Needs: No Transportation Needs (11/05/2022)   PRAPARE - Administrator, Civil Service (Medical): No    Lack of Transportation (Non-Medical): No  Physical Activity: Insufficiently Active (11/05/2022)   Exercise Vital Sign    Days of Exercise per Week: 3 days    Minutes of Exercise per Session: 20 min  Stress: No Stress Concern Present (11/05/2022)   [employer] of Occupational Health - Occupational Stress Questionnaire    Feeling of Stress : Only a little  Social Connections: Unknown (11/05/2022)   Social Connection and Isolation Panel    Frequency of Communication with Friends and Family: More than three times a week    Frequency of Social Gatherings with Friends and Family: Once a week    Attends Religious Services: More than 4 times per year    Active Member of [employer] or Organizations: Yes    Attends Banker Meetings: 1 to 4 times per year    Marital Status: Patient declined  Intimate Partner Violence: Unknown (05/20/2022)   Received from [facility]   HITS    Physically Hurt: Not on file    Insult or Talk Down To: Not on file    Threaten Physical Harm: Not on file    Scream or Curse: Not on file    Review of Systems:  All other review of systems negative except as mentioned in the HPI.  Physical Exam: Vital signs in last 24 hours: BP (!) 140/93   Pulse 72   Temp 98.1 F (36.7 C)   Ht 5' 2 (1.575 m)   Wt 170 lb (77.1 kg)   LMP 06/14/2011   SpO2 97%   BMI 31.09 kg/m  General:   Alert, NAD Lungs:  Clear .   Heart:  Regular rate and rhythm Abdomen:  Soft, nontender and nondistended. Neuro/Psych:  Alert and cooperative. Normal mood and affect. A and O x 3  Reviewed labs, radiology imaging, old records and pertinent past GI work up  Patient is appropriate for planned procedure(s) and anesthesia in an ambulatory setting   K. Veena Trinia Georgi , MD [phone]

## 2024-08-09 NOTE — Op Note (Addendum)
 [facility] Patient Name: Caroline Boone Procedure Date: 08/09/2024 10:06 AM MRN: [id number] Endoscopist: Gustav ALONSO Mcgee , MD, [id number] Age: 58 Referring MD:  Date of Birth: [birth year] Gender: Female Account #: [id number] Procedure:                Upper GI endoscopy Indications:              Persistent vomiting of unknown cause, Esophageal                            reflux symptoms that persist despite appropriate                            therapy, Melena, Chest pain (non cardiac) Medicines:                Monitored Anesthesia Care Procedure:                Pre-Anesthesia Assessment:                           - Prior to the procedure, a History and Physical                            was performed, and patient medications and                            allergies were reviewed. The patient's tolerance of                            previous anesthesia was also reviewed. The risks                            and benefits of the procedure and the sedation                            options and risks were discussed with the patient.                            All questions were answered, and informed consent                            was obtained. Prior Anticoagulants: The patient has                            taken no anticoagulant or antiplatelet agents. ASA                            Grade Assessment: II - A patient with mild systemic                            disease. After reviewing the risks and benefits,                            the patient was deemed in satisfactory condition to  undergo the procedure.                           After obtaining informed consent, the endoscope was                            passed under direct vision. Throughout the                            procedure, the patient's blood pressure, pulse, and                            oxygen saturations were monitored continuously. The                             Olympus Scope [id number] was introduced through the                            mouth, and advanced to the second part of duodenum.                            The upper GI endoscopy was accomplished without                            difficulty. The patient tolerated the procedure                            well. Scope In: Scope Out: Findings:                 One benign-appearing, intrinsic moderate                            (circumferential scarring or stenosis; an endoscope                            may pass) stenosis was found 34 to 35 cm from the                            incisors. This stenosis measured 1.7 cm (inner                            diameter) x less than one cm (in length). The                            stenosis was traversed. A TTS dilator was passed                            through the scope. Dilation with an 18-19-20 mm x 8                            cm CRE balloon dilator was performed to 20 mm. The  dilation site was examined following endoscope                            reinsertion and showed mild mucosal disruption and                            moderate improvement in luminal narrowing.                           A 4 cm hiatal hernia was present.                           The stomach was normal.                           The cardia and gastric fundus were normal on                            retroflexion.                           The examined duodenum was normal. Complications:            No immediate complications. Estimated Blood Loss:     Estimated blood loss was minimal. Impression:               - Benign-appearing esophageal stenosis. Dilated.                           - 4 cm hiatal hernia.                           - Normal stomach.                           - Normal examined duodenum.                           - No specimens collected. Recommendation:           - Clear liquid diet - advance as tolerated to                             resume previous diet.                           - Continue present medications.                           - Follow an antireflux regimen.                           - Use Prilosec (omeprazole ) 40 mg PO BID.                           - Use sucralfate  suspension/tablets 1 gram PO QID  PRN.                           - Return to GI office in 1 month with APP for                            follow up. Tinsleigh Slovacek V. Alyla Pietila, MD 08/09/2024 10:27:54 AM This report has been signed electronically.

## 2024-08-09 NOTE — Patient Instructions (Addendum)
 YOU HAD AN ENDOSCOPIC PROCEDURE TODAY AT THE [facility]:   Refer to the procedure report that was given to you for any specific questions about what was found during the examination.  If the procedure report does not answer your questions, please call your gastroenterologist to clarify.  If you requested that your care partner not be given the details of your procedure findings, then the procedure report has been included in a sealed envelope for you to review at your convenience later.  YOU SHOULD EXPECT: Some feelings of bloating in the abdomen. Passage of more gas than usual.  Walking can help get rid of the air that was put into your GI tract during the procedure and reduce the bloating. If you had a lower endoscopy (such as a colonoscopy or flexible sigmoidoscopy) you may notice spotting of blood in your stool or on the toilet paper. If you underwent a bowel prep for your procedure, you may not have a normal bowel movement for a few days.  Please Note:  You might notice some irritation and congestion in your nose or some drainage.  This is from the oxygen used during your procedure.  There is no need for concern and it should clear up in a day or so.  SYMPTOMS TO REPORT IMMEDIATELY:  Following upper endoscopy (EGD)  Vomiting of blood or coffee ground material  New chest pain or pain under the shoulder blades  Painful or persistently difficult swallowing  New shortness of breath  Fever of 100F or higher  Black, tarry-looking stools  Clear liquid diet - advance as tolerated to resume previous diet Continue present medications Follow an antireflux regimen Use Protonix  (pantoprazole ) 40 mg by mouth twice a day Handout on hiatal hernia given and post esophageal dilation diet   For urgent or emergent issues, a gastroenterologist can be reached at any hour by calling [phone]. Do not use MyChart messaging for urgent concerns.    DIET:  We do recommend a small meal at  first, but then you may proceed to your regular diet.  Drink plenty of fluids but you should avoid alcoholic beverages for 24 hours.  ACTIVITY:  You should plan to take it easy for the rest of today and you should NOT DRIVE or use heavy machinery until tomorrow (because of the sedation medicines used during the test).    FOLLOW UP: Our staff will call the number listed on your records the next business day following your procedure.  We will call around 7:15- 8:00 am to check on you and address any questions or concerns that you may have regarding the information given to you following your procedure. If we do not reach you, we will leave a message.     If any biopsies were taken you will be contacted by phone or by letter within the next 1-3 weeks.  Please call us  at [phone] if you have not heard about the biopsies in 3 weeks.    SIGNATURES/CONFIDENTIALITY: You and/or your care partner have signed paperwork which will be entered into your electronic medical record.  These signatures attest to the fact that that the information above on your After Visit Summary has been reviewed and is understood.  Full responsibility of the confidentiality of this discharge information lies with you and/or your care-partner.

## 2024-08-09 NOTE — Progress Notes (Unsigned)
 Sedate, gd SR, tolerated procedure well, VSS, report to RN

## 2024-08-09 NOTE — Progress Notes (Unsigned)
 Pt's states no medical or surgical changes since previsit or office visit.   Patient states her doctor has given her 2 samples of Wegovy for weight loss and last dose was Sunday 08/06/2024. John CRNA made aware.

## 2024-08-09 NOTE — Progress Notes (Signed)
 Called to room to assist during endoscopic procedure.  Patient ID and intended procedure confirmed with present staff. Received instructions for my participation in the procedure from the performing physician.

## 2024-08-10 ENCOUNTER — Telehealth: Payer: Self-pay

## 2024-08-10 NOTE — Telephone Encounter (Signed)
  Follow up Call-     08/09/2024    9:22 AM  Call back number  Post procedure Call Back phone  # [phone]  Permission to leave phone message Yes     Patient questions:  Do you have a fever, pain , or abdominal swelling? Yes.   Pain Score  3 *  Have you tolerated food without any problems? Yes.    Have you been able to return to your normal activities? Yes.    Do you have any questions about your discharge instructions: Diet   No. Medications  No. Follow up visit  No.  Do you have questions or concerns about your Care? No.  Actions: * If pain score is 4 or above: No action needed, pain <4.

## 2024-08-21 ENCOUNTER — Other Ambulatory Visit: Payer: Self-pay | Admitting: Family Medicine

## 2024-08-22 ENCOUNTER — Encounter: Payer: Self-pay | Admitting: Family Medicine

## 2024-08-22 NOTE — Telephone Encounter (Signed)
 Pt requests for a refill for Ambien 

## 2024-08-23 MED ORDER — ZOLPIDEM TARTRATE 10 MG PO TABS: 10.0000 mg | ORAL_TABLET | Freq: Every day | ORAL | 5 refills | Status: AC

## 2024-08-23 NOTE — Telephone Encounter (Signed)
 Done

## 2024-09-01 DIAGNOSIS — E871 Hypo-osmolality and hyponatremia: Secondary | ICD-10-CM | POA: Diagnosis not present

## 2024-09-01 DIAGNOSIS — I773 Arterial fibromuscular dysplasia: Secondary | ICD-10-CM | POA: Diagnosis not present

## 2024-09-01 DIAGNOSIS — I10 Essential (primary) hypertension: Secondary | ICD-10-CM | POA: Diagnosis not present

## 2024-09-01 DIAGNOSIS — E785 Hyperlipidemia, unspecified: Secondary | ICD-10-CM | POA: Diagnosis not present

## 2024-09-06 NOTE — Progress Notes (Unsigned)
 Cardiology Office Note   Date:  09/06/2024   ID:  Caroline Boone, DOB [birth year], MRN [id number]  PCP:  Johnny Garnette LABOR, MD  Cardiologist:   Lynwood Schilling, MD Referring:  ***  No chief complaint on file.     History of Present Illness: Caroline Boone is a 58 y.o. female who presents for for evaluation of palpitations.  She was in the ED for this in Sept.  I reviewed these records for this visit.   She was thought to be having some GERD to explain chest pain she was describing but was referred to us  because of this and palpitations.  In the ED she was treated with compazine  and pantoprazole .    She was previously seen by EP in 2021.  She was medically managed for NSVT and palpitations.  She had SVT on monitoring.  She had normal cath in 2019.       ***  Past Medical History:  Diagnosis Date   Anxiety    Arthritis    Carotid artery injury    Carotid stenosis    Dopplers 6/12:0-39% bilateral; distal ICAs with marked tortuosity; CT angiogram recommended;  followed at [facility]; surgery to correct LICA stenosis and pseudoaneurysm too risky - > medical Rx;  dopplers 8/13: B/L 0-39% (stable)   Cerebral aneurysm    Chest pain    GXT echo 8/11: normal LVF, no ischemia;  h/o neg. myoview in 2005   Colon polyps    Family history of adverse reaction to anesthesia    MOMTHER HAD TROUBLE WAKING   Fibromuscular dysplasia    affects left vertebral,  artery, left ICA, left renal artery;  head and neck CTA 10/24/11: stable pattern of fibromuscular dysplasia of ICA and VA with distal changes suggesting prior pseudoaneurysm and dissection; L dist ICA 60%; normal MRI 10/29/11;  followed at [facility] with serial CT scans   GERD (gastroesophageal reflux disease)    Headache    History of cardiac cath 2007   cath 11/25/05 by Dr. Herminio with normal cors and EF 50%   Hypertension    a. 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 Dino Sable  at [facility]    Past Surgical History:  Procedure Laterality Date   BREAST ENHANCEMENT SURGERY  1991   Bilateral breast    COLONOSCOPY  11/28/2021   per Dr. Kristie, precancerous polyps, repeat in 5 yrs   ESOPHAGOGASTRODUODENOSCOPY     LEFT HEART CATH AND CORONARY ANGIOGRAPHY N/A 01/12/2018   Procedure: LEFT HEART CATH AND CORONARY ANGIOGRAPHY;  Surgeon: Jordan, Peter M, MD;  Location: [facility] INVASIVE CV LAB;  Service: Cardiovascular;  Laterality: N/A;     Current Outpatient Medications  Medication Sig Dispense Refill   acetaminophen  (TYLENOL ) 500 MG tablet Take 1,000 mg by mouth every 6 (six) hours as needed for moderate pain.     ALPRAZolam  (XANAX ) 1 MG tablet TAKE 1 TABLET(1 MG) BY MOUTH THREE TIMES DAILY 270 tablet 1   aspirin  81 MG tablet Take 81 mg by mouth daily.       carvedilol  (COREG ) 12.5 MG tablet Take 1 tablet (12.5 mg total) by mouth 2 (two) times daily with a meal. 60 tablet 3   celecoxib (CELEBREX) 200 MG capsule Take by mouth.     cyclobenzaprine  (FLEXERIL ) 10 MG tablet TAKE  1 TABLET BY MOUTH THREE TIMES DAILY AS NEEDED FOR MUSCLE SPASM 90 tablet 5   escitalopram  (LEXAPRO ) 10 MG tablet Take 1 tablet (10 mg total) by mouth daily. 90 tablet 3   estradiol (ESTRACE) 0.01 % CREA vaginal cream Place vaginally.     furosemide (LASIX) 20 MG tablet Take 20 mg by mouth daily.     hydrochlorothiazide  (HYDRODIURIL ) 25 MG tablet Take 1 tablet (25 mg total) by mouth daily. (Patient not taking: Reported on 08/09/2024) 30 tablet 4   HYDROcodone -acetaminophen  (NORCO) 10-325 MG tablet Take 1 tablet by mouth every 6 (six) hours as needed (pain). 120 tablet 0   HYDROcodone -acetaminophen  (NORCO) 10-325 MG tablet Take 1 tablet by mouth every 6 (six) hours as needed (pain). 120 tablet 0   HYDROcodone -acetaminophen  (NORCO) 10-325 MG tablet Take 1 tablet by mouth every 6 (six) hours as needed (pain). 120 tablet 0   lisinopril  (ZESTRIL ) 20 MG tablet  Take 1 tablet (20 mg total) by mouth daily. (Patient taking differently: Take 20-40 mg by mouth daily.) 90 tablet 3   metoCLOPramide  (REGLAN ) 10 MG tablet Take 1 tablet (10 mg total) by mouth 4 (four) times daily -  before meals and at bedtime. 120 tablet 0   omeprazole  (PRILOSEC) 40 MG capsule Take 1 capsule (40 mg total) by mouth in the morning and at bedtime. 60 capsule 3   ondansetron  (ZOFRAN -ODT) 4 MG disintegrating tablet Take 1 tablet (4 mg total) by mouth every 8 (eight) hours as needed for nausea or vomiting. 20 tablet 1   promethazine  (PHENERGAN ) 25 MG tablet TAKE 1 TABLET(25 MG) BY MOUTH EVERY 4 HOURS AS NEEDED FOR NAUSEA OR VOMITING 60 tablet 5   sucralfate  (CARAFATE ) 1 GM/10ML suspension Take 10 mLs (1 g total) by mouth 4 (four) times daily -  with meals and at bedtime. 420 mL 3   UBRELVY  100 MG TABS TAKE 1 TABLET BY MOUTH AS NEEDED FOR MIGRAINES 30 tablet 11   zolpidem  (AMBIEN ) 10 MG tablet Take 1 tablet (10 mg total) by mouth at bedtime. 30 tablet 5   No current facility-administered medications for this visit.    Allergies:   Amoxicillin, Sulfamethoxazole, and Sulfonamide derivatives    Social History:  The patient  reports that she has never smoked. She has never used smokeless tobacco. She reports that she does not drink alcohol and does not use drugs.   Family History:  The patient's ***family history includes Arthritis in an other family member; Breast cancer in an other family member; Coronary artery disease in her mother; Diabetes in an other family member; Heart attack (age of onset: 66) in her father; Heart disease in her father; Heart disease (age of onset: 62) in her mother; Hypertension in her father and another family member; Kidney disease in her mother and another family member; Kidney failure in her father; Lung cancer in an other family member.    ROS:  Please see the history of present illness.   Otherwise, review of systems are positive for {NONE  DEFAULTED:18576}.   All other systems are reviewed and negative.    PHYSICAL EXAM: VS:  LMP 06/14/2011  , BMI There is no height or weight on file to calculate BMI. GENERAL:  Well appearing HEENT:  Pupils equal round and reactive, fundi not visualized, oral mucosa unremarkable NECK:  No jugular venous distention, waveform within normal limits, carotid upstroke brisk and symmetric, no bruits, no thyromegaly LYMPHATICS:  No cervical, inguinal adenopathy LUNGS:  Clear to auscultation  bilaterally BACK:  No CVA tenderness CHEST:  Unremarkable HEART:  PMI not displaced or sustained,S1 and S2 within normal limits, no S3, no S4, no clicks, no rubs, *** murmurs ABD:  Flat, positive bowel sounds normal in frequency in pitch, no bruits, no rebound, no guarding, no midline pulsatile mass, no hepatomegaly, no splenomegaly EXT:  2 plus pulses throughout, no edema, no cyanosis no clubbing SKIN:  No rashes no nodules NEURO:  Cranial nerves II through XII grossly intact, motor grossly intact throughout PSYCH:  Cognitively intact, oriented to person place and time    EKG:        Recent Labs: 07/12/2024: ALT 21; BUN 6; Creatinine, Ser 0.56; Hemoglobin 13.4; Platelets 304.0; Potassium 3.6; Sodium 137; TSH 0.51    Lipid Panel    Component Value Date/Time   CHOL 214 (H) 04/19/2024 0856   TRIG 89.0 04/19/2024 0856   HDL 61.90 04/19/2024 0856   CHOLHDL 3 04/19/2024 0856   VLDL 17.8 04/19/2024 0856   LDLCALC 135 (H) 04/19/2024 0856      Wt Readings from Last 3 Encounters:  08/09/24 170 lb (77.1 kg)  07/27/24 170 lb (77.1 kg)  07/12/24 170 lb (77.1 kg)      Other studies Reviewed: Additional studies/ records that were reviewed today include: ***. Review of the above records demonstrates:  Please see elsewhere in the note.  ***   ASSESSMENT AND PLAN:  *** Palpitations:  ***  Carotid stenosis:  ***  Chest pain:  ***   Current medicines are reviewed at length with the patient today.   The patient {ACTIONS; HAS/DOES NOT HAVE:19233} concerns regarding medicines.  The following changes have been made:  {PLAN; NO CHANGE:13088:s}  Labs/ tests ordered today include: *** No orders of the defined types were placed in this encounter.    Disposition:   FU with ***    Signed, Lynwood Schilling, MD  09/06/2024 6:59 PM    [facility]

## 2024-09-07 ENCOUNTER — Encounter: Payer: Self-pay | Admitting: Cardiology

## 2024-09-07 ENCOUNTER — Ambulatory Visit: Attending: Cardiology | Admitting: Cardiology

## 2024-09-07 ENCOUNTER — Other Ambulatory Visit: Payer: Self-pay | Admitting: Cardiology

## 2024-09-07 ENCOUNTER — Other Ambulatory Visit (HOSPITAL_COMMUNITY): Payer: Self-pay

## 2024-09-07 VITALS — BP 140/80 | HR 104 | Ht 62.0 in | Wt 165.0 lb

## 2024-09-07 DIAGNOSIS — R0989 Other specified symptoms and signs involving the circulatory and respiratory systems: Secondary | ICD-10-CM

## 2024-09-07 DIAGNOSIS — R5383 Other fatigue: Secondary | ICD-10-CM | POA: Diagnosis not present

## 2024-09-07 MED ORDER — DILTIAZEM HCL ER COATED BEADS 120 MG PO CP24
120.0000 mg | ORAL_CAPSULE | Freq: Every day | ORAL | 3 refills | Status: AC
  Filled 2024-09-07: qty 90, 90d supply, fill #0

## 2024-09-07 MED ORDER — LISINOPRIL 40 MG PO TABS: 40.0000 mg | ORAL_TABLET | Freq: Every day | ORAL | 0 refills | Status: DC

## 2024-09-07 NOTE — Patient Instructions (Signed)
 Medication Instructions:  Your physician has recommended you make the following change in your medication:  1) START taking Cardizem  (diltiazem ) 120 mg once daily   *If you need a refill on your cardiac medications before your next appointment, please call your pharmacy*  Testing/Procedures: Carotid Dopplers Your physician has requested that you have cardiac CT. Cardiac computed tomography (CT) is a painless test that uses an x-ray machine to take clear, detailed pictures of your heart. For further information please visit [email or web address] Please follow instruction sheet as given.  [place] Sleep Study  Your physician has recommended that you have a sleep study. This test records several body functions during sleep, including: brain activity, eye movement, oxygen and carbon dioxide blood levels, heart rate and rhythm, breathing rate and rhythm, the flow of air through your mouth and nose, snoring, body muscle movements, and chest and belly movement.  Follow-Up: At [facility], you and your health needs are our priority.  As part of our continuing mission to provide you with exceptional heart care, our providers are all part of one team.  This team includes your primary Cardiologist (physician) and Advanced Practice Providers or APPs (Physician Assistants and Nurse Practitioners) who all work together to provide you with the care you need, when you need it.  Your next appointment:   1 month  Provider:   Lynwood Schilling, MD

## 2024-09-13 ENCOUNTER — Ambulatory Visit (HOSPITAL_COMMUNITY)
Admission: RE | Admit: 2024-09-13 | Discharge: 2024-09-13 | Disposition: A | Discharge status: Home / Self Care | Source: Ambulatory Visit | Attending: Cardiology | Admitting: Cardiology

## 2024-09-13 DIAGNOSIS — R0989 Other specified symptoms and signs involving the circulatory and respiratory systems: Secondary | ICD-10-CM | POA: Diagnosis not present

## 2024-09-16 ENCOUNTER — Ambulatory Visit: Payer: Self-pay | Admitting: Cardiology

## 2024-09-22 ENCOUNTER — Telehealth: Payer: Self-pay

## 2024-09-22 ENCOUNTER — Other Ambulatory Visit (HOSPITAL_COMMUNITY): Payer: Self-pay

## 2024-09-22 NOTE — Telephone Encounter (Signed)
 Pharmacy Patient Advocate Encounter   Received notification from Onbase that prior authorization for Ubrelvy  100 is required/requested.   Insurance verification completed.   The patient is insured through [facility].   Per test claim: PA required; PA submitted to above mentioned insurance via Latent Key/confirmation #/EOC [id number] Status is pending

## 2024-09-25 ENCOUNTER — Other Ambulatory Visit (HOSPITAL_COMMUNITY): Payer: Self-pay

## 2024-09-26 ENCOUNTER — Other Ambulatory Visit: Payer: Self-pay | Admitting: Family Medicine

## 2024-09-26 ENCOUNTER — Ambulatory Visit: Admitting: Gastroenterology

## 2024-09-28 ENCOUNTER — Other Ambulatory Visit (HOSPITAL_COMMUNITY): Payer: Self-pay

## 2024-09-28 NOTE — Telephone Encounter (Signed)
 Pharmacy Patient Advocate Encounter  Received notification from [facility] that Prior Authorization for Ubrelvy  100 has been DENIED.  No reason given; No denial letter received via Fax or CMM. It has been requested and will be uploaded to the media tab once received.     PA #/Case ID/Reference #: # [id number]

## 2024-10-26 DIAGNOSIS — I6529 Occlusion and stenosis of unspecified carotid artery: Secondary | ICD-10-CM | POA: Insufficient documentation

## 2024-10-26 NOTE — Progress Notes (Unsigned)
 " Cardiology Office Note:   Date:  10/26/2024  ID:  Caroline Boone, DOB 04/17/1966, MRN [id number] PCP: Johnny Garnette LABOR, MD  [facility] Providers Cardiologist:  Lynwood Schilling, MD Electrophysiologist:  Will Gladis Norton, MD {  History of Present Illness:   Caroline Boone is a 59 y.o. female who presents for for evaluation of palpitations.  She was in the ED for this in Sept.  I reviewed these records for this visit.   She was thought to be having some GERD to explain chest pain she was describing but was referred to us  because of this and palpitations.  In the ED she was treated with compazine  and pantoprazole .    She was previously seen by EP in 2021.  She was medically managed for NSVT and palpitations.  She had SVT on monitoring.  She had normal cath in 2019.        Since she was last seen ***   ***  We have not seen her in over three years.  She reports that she has been having in creased palpitations.  She said that sometimes started when she was somewhat nauseated and had upset stomach.  Her heart rate will go up.  She had hot flashes.  She did palpitations.  She did have an EGD with a small hiatal hernia and had her esophageal stretch.  It helped with a little bit of a swallowing difficulty but she still getting palpitations.  Seems to happen almost daily like anxiety.  It is been increasing in frequency.  It last for few minutes at a time.  Goes away spontaneously.  She walks for exercise and does not bring it on at that time.  She did have her Coreg  increased.  That did not seem to help.  Her electrolytes and thyroid  were normal earlier this year.  She does have a Watch and I do see heart rates to go up to the 120s but there are no EKGs to look at.  She could do that.    ROS: ***  Studies Reviewed:    EKG:       ***  Risk Assessment/Calculations:   {Does this patient have ATRIAL FIBRILLATION?[phone]} No BP recorded.  {Refresh Note OR Click here to enter BP  :1}***    STOP-Bang Score:  5  { Consider Dx Sleep Disordered Breathing or Sleep Apnea  ICD G47.33          :1}     Physical Exam:   VS:  LMP 06/14/2011    Wt Readings from Last 3 Encounters:  09/07/24 165 lb (74.8 kg)  08/09/24 170 lb (77.1 kg)  07/27/24 170 lb (77.1 kg)     GEN: Well nourished, well developed in no acute distress NECK: No JVD; No carotid bruits CARDIAC: ***RR, *** murmurs, rubs, gallops RESPIRATORY:  Clear to auscultation without rales, wheezing or rhonchi  ABDOMEN: Soft, non-tender, non-distended EXTREMITIES:  No edema; No deformity   ASSESSMENT AND PLAN:   Palpitations: ***   I am going to start by empirically adding Cardizem  120 mg daily.  She will let me know via MyChart whether we have to reduce her beta-blocker or possibly her ACE inhibitor pending her blood pressure.  She let me know if her symptoms improved.  If not I will likely apply another monitor.   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   Apnea: ***  Patient has witnessed apneic spells somewhat difficult to control hypertension.  I am going to check a home sleep study.  STOP-BANG is 5   Hypertension: ***  This will be managed in the context of treating her palpitations.     Current medicines are reviewed at length with the patient today.  The patient does not have concerns regarding medicines.     Follow up ***  Signed, Lynwood Schilling, MD   "

## 2024-10-27 ENCOUNTER — Ambulatory Visit: Admitting: Cardiology

## 2024-10-29 ENCOUNTER — Other Ambulatory Visit (HOSPITAL_COMMUNITY): Payer: Self-pay

## 2024-11-09 ENCOUNTER — Telehealth: Admitting: Family Medicine

## 2024-11-09 ENCOUNTER — Encounter: Payer: Self-pay | Admitting: Family Medicine

## 2024-11-09 DIAGNOSIS — G8929 Other chronic pain: Secondary | ICD-10-CM | POA: Diagnosis not present

## 2024-11-09 DIAGNOSIS — M545 Low back pain, unspecified: Secondary | ICD-10-CM

## 2024-11-09 MED ORDER — HYDROCODONE-ACETAMINOPHEN 10-325 MG PO TABS: 1.0000 | ORAL_TABLET | Freq: Four times a day (QID) | ORAL | 0 refills | Status: AC | PRN

## 2024-11-09 NOTE — Progress Notes (Signed)
 "  Subjective:    Patient ID: Caroline Boone, female    DOB: 21-Mar-1966, 59 y.o.   MRN: [id number]  HPI Virtual Visit via Video Note  I connected with the patient on 11/09/24 at  8:30 AM EST by a video enabled telemedicine application and verified that I am speaking with the correct person using two identifiers.  Location patient: home Location provider:work or home office Persons participating in the virtual visit: patient, provider  I discussed the limitations of evaluation and management by telemedicine and the availability of in person appointments. The patient expressed understanding and agreed to proceed.   HPI: Here for pain management. She is doing well.    ROS: See pertinent positives and negatives per HPI.  Past Medical History:  Diagnosis Date   Anxiety    Arthritis    Carotid artery injury    Carotid stenosis    Dopplers 6/12:0-39% bilateral; distal ICAs with marked tortuosity; CT angiogram recommended;  followed at [facility]; surgery to correct LICA stenosis and pseudoaneurysm too risky - > medical Rx;  dopplers 8/13: B/L 0-39% (stable)   Cerebral aneurysm    Chest pain    GXT echo 8/11: normal LVF, no ischemia;  h/o neg. myoview in 2005   Colon polyps    Family history of adverse reaction to anesthesia    MOMTHER HAD TROUBLE WAKING   Fibromuscular dysplasia    affects left vertebral,  artery, left ICA, left renal artery;  head and neck CTA 10/24/11: stable pattern of fibromuscular dysplasia of ICA and VA with distal changes suggesting prior pseudoaneurysm and dissection; L dist ICA 60%; normal MRI 10/29/11;  followed at [facility] with serial CT scans   GERD (gastroesophageal reflux disease)    Headache    History of cardiac cath 2007   cath 11/25/05 by Dr. Herminio with normal cors and EF 50%   Hypertension    a. 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 Dino Sable  at [facility]    Past Surgical History:  Procedure Laterality Date   BREAST ENHANCEMENT SURGERY  1991   Bilateral breast    COLONOSCOPY  11/28/2021   per Dr. Kristie, precancerous polyps, repeat in 5 yrs   ESOPHAGOGASTRODUODENOSCOPY     LEFT HEART CATH AND CORONARY ANGIOGRAPHY N/A 01/12/2018   Procedure: LEFT HEART CATH AND CORONARY ANGIOGRAPHY;  Surgeon: Jordan, Peter M, MD;  Location: [facility] INVASIVE CV LAB;  Service: Cardiovascular;  Laterality: N/A;    Family History  Problem Relation Age of Onset   Coronary artery disease Mother    Kidney disease Mother    Heart disease Mother 34   Heart attack Father 35       CABG   Kidney failure Father    Hypertension Father    Heart disease Father    Arthritis Other        family hx   Breast cancer Other        relative ,50   Diabetes Other        family hx   Hypertension Other        family hx   Kidney disease Other        family hx   Lung cancer Other        family hx   Esophageal cancer Neg Hx    Colon  cancer Neg Hx    Rectal cancer Neg Hx    Stomach cancer Neg Hx     Current Medications[1]  EXAM:  VITALS per patient if applicable:  GENERAL: alert, oriented, appears well and in no acute distress  HEENT: atraumatic, conjunttiva clear, no obvious abnormalities on inspection of external nose and ears  NECK: normal movements of the head and neck  LUNGS: on inspection no signs of respiratory distress, breathing rate appears normal, no obvious gross SOB, gasping or wheezing  CV: no obvious cyanosis  MS: moves all visible extremities without noticeable abnormality  PSYCH/NEURO: pleasant and cooperative, no obvious depression or anxiety, speech and thought processing grossly intact  ASSESSMENT AND PLAN: Pain management. Indication for chronic opioid: low back pain Medication and dose: Norco 10-325 # pills per month: 120 Last UDS date: 04-19-24 Opioid Treatment Agreement signed (Y/N): 03-22-18 Opioid Treatment  Agreement last reviewed with patient:  11-09-24 [facility] reviewed this encounter (include red flags): Yes Meds were refilled.  Garnette Olmsted, MD  Discussed the following assessment and plan:  No diagnosis found.     I discussed the assessment and treatment plan with the patient. The patient was provided an opportunity to ask questions and all were answered. The patient agreed with the plan and demonstrated an understanding of the instructions.   The patient was advised to call back or seek an in-person evaluation if the symptoms worsen or if the condition fails to improve as anticipated.      Review of Systems     Objective:   Physical Exam        Assessment & Plan:       [1]  Current Outpatient Medications:    acetaminophen  (TYLENOL ) 500 MG tablet, Take 1,000 mg by mouth every 6 (six) hours as needed for moderate pain., Disp: , Rfl:    ALPRAZolam  (XANAX ) 1 MG tablet, TAKE 1 TABLET(1 MG) BY MOUTH THREE TIMES DAILY., Disp: 270 tablet, Rfl: 1   aspirin  81 MG tablet, Take 81 mg by mouth daily.  , Disp: , Rfl:    carvedilol  (COREG ) 12.5 MG tablet, Take 1 tablet (12.5 mg total) by mouth 2 (two) times daily with a meal. (Patient taking differently: Take 25 mg by mouth 2 (two) times daily with a meal.), Disp: 60 tablet, Rfl: 3   celecoxib (CELEBREX) 200 MG capsule, Take by mouth., Disp: , Rfl:    cyclobenzaprine  (FLEXERIL ) 10 MG tablet, TAKE 1 TABLET BY MOUTH THREE TIMES DAILY AS NEEDED FOR MUSCLE SPASM, Disp: 90 tablet, Rfl: 5   diltiazem  (CARDIZEM  CD) 120 MG 24 hr capsule, Take 1 capsule (120 mg total) by mouth daily., Disp: 90 capsule, Rfl: 3   escitalopram  (LEXAPRO ) 10 MG tablet, Take 1 tablet (10 mg total) by mouth daily., Disp: 90 tablet, Rfl: 3   estradiol (ESTRACE) 0.01 % CREA vaginal cream, Place vaginally., Disp: , Rfl:    furosemide (LASIX) 20 MG tablet, Take 20 mg by mouth daily., Disp: , Rfl:    HYDROcodone -acetaminophen  (NORCO) 10-325 MG tablet, Take 1 tablet by mouth  every 6 (six) hours as needed (pain)., Disp: 120 tablet, Rfl: 0   lisinopril  (ZESTRIL ) 40 MG tablet, Take 1 tablet (40 mg total) by mouth daily., Disp: 90 tablet, Rfl: 3   metoCLOPramide  (REGLAN ) 10 MG tablet, Take 1 tablet (10 mg total) by mouth 4 (four) times daily -  before meals and at bedtime., Disp: 120 tablet, Rfl: 0   omeprazole  (PRILOSEC) 40 MG capsule, Take 1 capsule (  40 mg total) by mouth in the morning and at bedtime., Disp: 60 capsule, Rfl: 3   ondansetron  (ZOFRAN -ODT) 4 MG disintegrating tablet, Take 1 tablet (4 mg total) by mouth every 8 (eight) hours as needed for nausea or vomiting., Disp: 20 tablet, Rfl: 1   promethazine  (PHENERGAN ) 25 MG tablet, TAKE 1 TABLET(25 MG) BY MOUTH EVERY 4 HOURS AS NEEDED FOR NAUSEA OR VOMITING, Disp: 60 tablet, Rfl: 5   sucralfate  (CARAFATE ) 1 GM/10ML suspension, Take 10 mLs (1 g total) by mouth 4 (four) times daily -  with meals and at bedtime., Disp: 420 mL, Rfl: 3   UBRELVY  100 MG TABS, TAKE 1 TABLET BY MOUTH AS NEEDED FOR MIGRAINES, Disp: 30 tablet, Rfl: 11   zolpidem  (AMBIEN ) 10 MG tablet, Take 1 tablet (10 mg total) by mouth at bedtime., Disp: 30 tablet, Rfl: 5  "

## 2024-11-16 NOTE — Progress Notes (Unsigned)
 " Cardiology Office Note:   Date:  11/16/2024  ID:  Caroline Boone, DOB [birth year], MRN [id number] PCP: Johnny Garnette LABOR, MD  [facility] Providers Cardiologist:  Lynwood Schilling, MD Electrophysiologist:  Will Gladis Norton, MD {  History of Present Illness:   Caroline Boone is a 59 y.o. female who presents for for evaluation of palpitations.  She was in the ED for this in Sept.  I reviewed these records for this visit.   She was thought to be having some GERD to explain chest pain she was describing but was referred to us  because of this and palpitations.  In the ED she was treated with compazine  and pantoprazole .    She was previously seen by EP in 2021.  She was medically managed for NSVT and palpitations.  She had SVT on monitoring.  She had normal cath in 2019.        Since she was last seen ***   ***  We have not seen her in over three years.  She reports that she has been having in creased palpitations.  She said that sometimes started when she was somewhat nauseated and had upset stomach.  Her heart rate will go up.  She had hot flashes.  She did palpitations.  She did have an EGD with a small hiatal hernia and had her esophageal stretch.  It helped with a little bit of a swallowing difficulty but she still getting palpitations.  Seems to happen almost daily like anxiety.  It is been increasing in frequency.  It last for few minutes at a time.  Goes away spontaneously.  She walks for exercise and does not bring it on at that time.  She did have her Coreg  increased.  That did not seem to help.  Her electrolytes and thyroid  were normal earlier this year.  She does have a Watch and I do see heart rates to go up to the 120s but there are no EKGs to look at.  She could do that.    ROS: ***  Studies Reviewed:    EKG:       ***  Risk Assessment/Calculations:   {Does this patient have ATRIAL FIBRILLATION?[phone]} No BP recorded.  {Refresh Note OR Click here to enter BP  :1}***    STOP-Bang Score:  5  { Consider Dx Sleep Disordered Breathing or Sleep Apnea  ICD G47.33          :1}     Physical Exam:   VS:  LMP 06/14/2011    Wt Readings from Last 3 Encounters:  09/07/24 165 lb (74.8 kg)  08/09/24 170 lb (77.1 kg)  07/27/24 170 lb (77.1 kg)     GEN: Well nourished, well developed in no acute distress NECK: No JVD; No carotid bruits CARDIAC: ***RR, *** murmurs, rubs, gallops RESPIRATORY:  Clear to auscultation without rales, wheezing or rhonchi  ABDOMEN: Soft, non-tender, non-distended EXTREMITIES:  No edema; No deformity   ASSESSMENT AND PLAN:   Palpitations: ***   I am going to start by empirically adding Cardizem  120 mg daily.  She will let me know via MyChart whether we have to reduce her beta-blocker or possibly her ACE inhibitor pending her blood pressure.  She let me know if her symptoms improved.  If not I will likely apply another monitor.   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   Apnea: ***  Patient has witnessed apneic spells somewhat difficult to control hypertension.  I am going to check a home sleep study.  STOP-BANG is 5   Hypertension: ***  This will be managed in the context of treating her palpitations.     Current medicines are reviewed at length with the patient today.  The patient does not have concerns regarding medicines.     Follow up ***  Signed, Lynwood Schilling, MD   "

## 2024-11-17 ENCOUNTER — Ambulatory Visit: Admitting: Cardiology

## 2024-11-17 DIAGNOSIS — R0989 Other specified symptoms and signs involving the circulatory and respiratory systems: Secondary | ICD-10-CM

## 2024-11-17 DIAGNOSIS — R002 Palpitations: Secondary | ICD-10-CM

## 2024-11-17 DIAGNOSIS — I10 Essential (primary) hypertension: Secondary | ICD-10-CM

## 2024-11-18 ENCOUNTER — Other Ambulatory Visit: Payer: Self-pay | Admitting: Family Medicine

## 2025-01-04 ENCOUNTER — Ambulatory Visit: Admitting: Emergency Medicine
